# Patient Record
Sex: Female | Born: 1940
Health system: Southern US, Community
[De-identification: ages and names within clinical notes are randomized; demographics above are authoritative.]

## PROBLEM LIST (undated history)

## (undated) DIAGNOSIS — E785 Hyperlipidemia, unspecified: Secondary | ICD-10-CM

## (undated) DIAGNOSIS — I10 Essential (primary) hypertension: Secondary | ICD-10-CM

## (undated) DIAGNOSIS — Z9109 Other allergy status, other than to drugs and biological substances: Secondary | ICD-10-CM

## (undated) DIAGNOSIS — K219 Gastro-esophageal reflux disease without esophagitis: Secondary | ICD-10-CM

## (undated) HISTORY — PX: COLONOSCOPY: SHX174

## (undated) HISTORY — DX: Hyperlipidemia, unspecified: E78.5

## (undated) HISTORY — PX: TONSILLECTOMY: SHX5217

---

## 2001-12-20 DIAGNOSIS — J309 Allergic rhinitis, unspecified: Secondary | ICD-10-CM | POA: Insufficient documentation

## 2007-03-13 ENCOUNTER — Ambulatory Visit: Payer: Self-pay | Admitting: Gastroenterology

## 2007-03-13 LAB — HM COLONOSCOPY

## 2007-03-15 ENCOUNTER — Ambulatory Visit: Payer: Self-pay | Admitting: Family Medicine

## 2008-09-18 ENCOUNTER — Ambulatory Visit: Payer: Self-pay | Admitting: Family Medicine

## 2008-10-18 ENCOUNTER — Ambulatory Visit: Payer: Self-pay | Admitting: Family Medicine

## 2010-01-02 DIAGNOSIS — E785 Hyperlipidemia, unspecified: Secondary | ICD-10-CM | POA: Insufficient documentation

## 2011-04-02 LAB — HM PAP SMEAR

## 2011-04-13 ENCOUNTER — Ambulatory Visit: Payer: Self-pay | Admitting: Family Medicine

## 2012-06-15 ENCOUNTER — Ambulatory Visit: Payer: Self-pay | Admitting: Family Medicine

## 2012-06-15 LAB — HM MAMMOGRAPHY: HM Mammogram: 7112013

## 2012-12-13 ENCOUNTER — Ambulatory Visit: Payer: Self-pay | Admitting: Family Medicine

## 2013-01-23 ENCOUNTER — Ambulatory Visit: Payer: Self-pay | Admitting: Family Medicine

## 2014-09-12 LAB — LIPID PANEL
CHOLESTEROL: 220 mg/dL — AB (ref 0–200)
HDL: 52 mg/dL (ref 35–70)
LDL Cholesterol: 153 mg/dL
Triglycerides: 77 mg/dL (ref 40–160)

## 2014-10-30 ENCOUNTER — Ambulatory Visit: Payer: Self-pay | Admitting: Family Medicine

## 2014-10-30 LAB — HM DEXA SCAN

## 2014-12-20 DIAGNOSIS — M5489 Other dorsalgia: Secondary | ICD-10-CM | POA: Diagnosis not present

## 2014-12-20 DIAGNOSIS — M9903 Segmental and somatic dysfunction of lumbar region: Secondary | ICD-10-CM | POA: Diagnosis not present

## 2015-01-03 DIAGNOSIS — M5489 Other dorsalgia: Secondary | ICD-10-CM | POA: Diagnosis not present

## 2015-01-03 DIAGNOSIS — M9903 Segmental and somatic dysfunction of lumbar region: Secondary | ICD-10-CM | POA: Diagnosis not present

## 2015-03-02 ENCOUNTER — Emergency Department: Payer: Self-pay | Admitting: Emergency Medicine

## 2015-03-02 DIAGNOSIS — R42 Dizziness and giddiness: Secondary | ICD-10-CM | POA: Diagnosis not present

## 2015-03-02 DIAGNOSIS — R55 Syncope and collapse: Secondary | ICD-10-CM | POA: Diagnosis not present

## 2015-03-02 LAB — CBC WITH DIFFERENTIAL/PLATELET
BASOS PCT: 0.3 %
Basophil #: 0 10*3/uL (ref 0.0–0.1)
Eosinophil #: 0.2 10*3/uL (ref 0.0–0.7)
Eosinophil %: 1.8 %
HCT: 41.6 % (ref 35.0–47.0)
HGB: 13.6 g/dL (ref 12.0–16.0)
Lymphocyte #: 3.3 10*3/uL (ref 1.0–3.6)
Lymphocyte %: 35.2 %
MCH: 29.5 pg (ref 26.0–34.0)
MCHC: 32.6 g/dL (ref 32.0–36.0)
MCV: 91 fL (ref 80–100)
Monocyte #: 0.5 x10 3/mm (ref 0.2–0.9)
Monocyte %: 5.2 %
Neutrophil #: 5.4 10*3/uL (ref 1.4–6.5)
Neutrophil %: 57.5 %
Platelet: 216 10*3/uL (ref 150–440)
RBC: 4.59 10*6/uL (ref 3.80–5.20)
RDW: 13.9 % (ref 11.5–14.5)
WBC: 9.4 10*3/uL (ref 3.6–11.0)

## 2015-03-02 LAB — URINALYSIS, COMPLETE
BACTERIA: NONE SEEN
BLOOD: NEGATIVE
Bilirubin,UR: NEGATIVE
GLUCOSE, UR: NEGATIVE mg/dL (ref 0–75)
Leukocyte Esterase: NEGATIVE
NITRITE: NEGATIVE
PH: 7 (ref 4.5–8.0)
Protein: NEGATIVE
RBC,UR: 10 /HPF (ref 0–5)
SPECIFIC GRAVITY: 1.017 (ref 1.003–1.030)
Squamous Epithelial: <1

## 2015-03-02 LAB — BASIC METABOLIC PANEL
Anion Gap: 6 — ABNORMAL LOW (ref 7–16)
BUN: 17 mg/dL
CHLORIDE: 104 mmol/L
CO2: 29 mmol/L
CREATININE: 0.67 mg/dL
Calcium, Total: 9.3 mg/dL
EGFR (African American): >60
EGFR (Non-African Amer.): >60
GLUCOSE: 116 mg/dL — AB
Potassium: 4.3 mmol/L
Sodium: 139 mmol/L

## 2015-03-02 LAB — TROPONIN I: Troponin-I: <0.03 ng/mL

## 2015-03-07 DIAGNOSIS — I10 Essential (primary) hypertension: Secondary | ICD-10-CM | POA: Diagnosis not present

## 2015-03-07 LAB — CBC AND DIFFERENTIAL
HEMATOCRIT: 45 % (ref 36–46)
HEMOGLOBIN: 14.7 g/dL (ref 12.0–16.0)
Platelets: 276 10*3/uL (ref 150–399)
WBC: 9.4 10*3/mL

## 2015-03-07 LAB — BASIC METABOLIC PANEL
BUN: 12 mg/dL (ref 4–21)
CREATININE: 0.8 mg/dL (ref <=1.1)
GLUCOSE: 96 mg/dL
Potassium: 4.9 mmol/L (ref 3.4–5.3)
Sodium: 142 mmol/L (ref 137–147)

## 2015-03-07 LAB — HEPATIC FUNCTION PANEL
ALT: 12 U/L (ref 7–35)
AST: 19 U/L (ref 13–35)

## 2015-03-07 LAB — TSH: TSH: 3.37 u[IU]/mL (ref <=5.90)

## 2015-03-14 DIAGNOSIS — M9903 Segmental and somatic dysfunction of lumbar region: Secondary | ICD-10-CM | POA: Diagnosis not present

## 2015-03-14 DIAGNOSIS — M5489 Other dorsalgia: Secondary | ICD-10-CM | POA: Diagnosis not present

## 2015-03-19 DIAGNOSIS — M5489 Other dorsalgia: Secondary | ICD-10-CM | POA: Diagnosis not present

## 2015-03-19 DIAGNOSIS — M9903 Segmental and somatic dysfunction of lumbar region: Secondary | ICD-10-CM | POA: Diagnosis not present

## 2015-03-21 DIAGNOSIS — I10 Essential (primary) hypertension: Secondary | ICD-10-CM | POA: Diagnosis not present

## 2015-03-26 DIAGNOSIS — M9903 Segmental and somatic dysfunction of lumbar region: Secondary | ICD-10-CM | POA: Diagnosis not present

## 2015-03-26 DIAGNOSIS — M5489 Other dorsalgia: Secondary | ICD-10-CM | POA: Diagnosis not present

## 2015-03-31 DIAGNOSIS — I10 Essential (primary) hypertension: Secondary | ICD-10-CM | POA: Insufficient documentation

## 2015-04-02 DIAGNOSIS — M9903 Segmental and somatic dysfunction of lumbar region: Secondary | ICD-10-CM | POA: Diagnosis not present

## 2015-04-02 DIAGNOSIS — M5489 Other dorsalgia: Secondary | ICD-10-CM | POA: Diagnosis not present

## 2015-04-09 DIAGNOSIS — M5489 Other dorsalgia: Secondary | ICD-10-CM | POA: Diagnosis not present

## 2015-04-09 DIAGNOSIS — M9903 Segmental and somatic dysfunction of lumbar region: Secondary | ICD-10-CM | POA: Diagnosis not present

## 2015-04-16 DIAGNOSIS — M9903 Segmental and somatic dysfunction of lumbar region: Secondary | ICD-10-CM | POA: Diagnosis not present

## 2015-04-16 DIAGNOSIS — M5489 Other dorsalgia: Secondary | ICD-10-CM | POA: Diagnosis not present

## 2015-04-23 DIAGNOSIS — M9903 Segmental and somatic dysfunction of lumbar region: Secondary | ICD-10-CM | POA: Diagnosis not present

## 2015-04-23 DIAGNOSIS — M5489 Other dorsalgia: Secondary | ICD-10-CM | POA: Diagnosis not present

## 2015-04-30 DIAGNOSIS — M9903 Segmental and somatic dysfunction of lumbar region: Secondary | ICD-10-CM | POA: Diagnosis not present

## 2015-04-30 DIAGNOSIS — M5489 Other dorsalgia: Secondary | ICD-10-CM | POA: Diagnosis not present

## 2015-05-19 ENCOUNTER — Ambulatory Visit (INDEPENDENT_AMBULATORY_CARE_PROVIDER_SITE_OTHER): Payer: Medicare Other | Admitting: Family Medicine

## 2015-05-19 ENCOUNTER — Encounter: Payer: Self-pay | Admitting: Family Medicine

## 2015-05-19 VITALS — BP 134/72 | HR 60 | Temp 97.8°F | Resp 16 | Ht 60.25 in | Wt 205.0 lb

## 2015-05-19 DIAGNOSIS — Z1211 Encounter for screening for malignant neoplasm of colon: Secondary | ICD-10-CM

## 2015-05-19 DIAGNOSIS — Z0000 Encounter for general adult medical examination without abnormal findings: Secondary | ICD-10-CM | POA: Diagnosis not present

## 2015-05-19 DIAGNOSIS — Z23 Encounter for immunization: Secondary | ICD-10-CM | POA: Diagnosis not present

## 2015-05-19 LAB — IFOBT (OCCULT BLOOD): IFOBT: NEGATIVE

## 2015-05-19 NOTE — Progress Notes (Signed)
Patient ID: Carrie Wheeler, female   DOB: [birth year], 74 y.o.   MRN: [id number] Patient: Carrie Wheeler, Female    DOB: January 29, 1941, 74 y.o.   MRN: [id number] Visit Date: 05/19/2015  Today's Provider: Margarita Rana, MD   Chief Complaint  Patient presents with  . Annual Exam   Subjective:    Annual wellness visit Carrie Wheeler is a 74 y.o. female who presents today for her Subsequent Annual Wellness Visit. She feels well with no complaints. She reports exercising about two days a week. She reports she is sleeping well.    -----------------------------------------------------------   Review of Systems  Constitutional: Negative.   HENT: Negative.   Eyes: Negative.   Respiratory: Negative.   Cardiovascular: Negative.   Gastrointestinal: Negative.   Endocrine: Negative.   Genitourinary: Negative.   Musculoskeletal: Negative.   Skin: Negative.   Allergic/Immunologic: Negative.   Neurological: Negative.   Hematological: Negative.   Psychiatric/Behavioral: Negative.     History   Social History  . Marital Status: Single    Spouse Name: N/A  . Number of Children: N/A  . 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 Smoking status: Never Smoker   . Smokeless tobacco: Never Used  . Alcohol Use: No     Comment: Formerly a minimal user  . Drug Use: No  . Sexual Activity: Not on file   Other Topics Concern  . Not on file   Social History Narrative    Patient Active Problem List   Diagnosis Date Noted  . Essential (primary) hypertension 03/31/2015  . Extreme obesity 03/31/2015  . Hypercholesteremia 01/02/2010  . Avitaminosis D 12/11/2008  . Alcohol drinker 02/16/2007  . Family history of cancer of digestive system 10/31/2006  . Allergic rhinitis 12/20/2001    Past Surgical History  Procedure Laterality Date  . Tonsillectomy      Her family history includes Alzheimer's disease in her other; Colon  cancer in her father and other; Diabetes in her mother and other; Heart Problems in her brother; Hypertension in her father.    Previous Medications   AMLODIPINE (NORVASC) 5 MG TABLET    Take by mouth.   BIOFLAVONOID PRODUCTS (VITAMIN C) CHEW    Chew by mouth.   CALCIUM CARBONATE (OS-CAL) 600 MG TABS TABLET    Take 1 tablet by mouth daily.   CETIRIZINE (ZYRTEC) 10 MG TABLET    Take 1 tablet by mouth daily.   CHOLECALCIFEROL (VITAMIN D) 2000 UNITS CAPS    Take by mouth.   FLUTICASONE (FLONASE) 50 MCG/ACT NASAL SPRAY    Place into the nose.   MONTELUKAST (SINGULAIR) 10 MG TABLET    Take by mouth.   MULTIPLE VITAMINS-MINERALS (OCUVITE ADULT 50+ PO)    Take by mouth.   OMEGA-3 FATTY ACIDS PO    FISH-EPA, 1000MG  (Oral Capsule)  2 po bid for 0 days  Quantity: 120.00;  Refills: 0   Ordered :24-Dec-2010  Margarita Rana MD;  Started 08-Apr-2010 Active Comments: DX: 272.0   PSYLLIUM 48.57 % POWD    METAMUCIL, 48.57% (Oral Powder)  QHS, As Directed for 0 days  Quantity: 0.00;  Refills: 0   Ordered :24-Dec-2010  Doy Hutching ;  Started 10-Apr-2010 Active    Patient Care Team: Margarita Rana, MD as PCP - General (Family Medicine)     Objective:   Vitals: BP 134/72 mmHg  Pulse 60  Temp(Src) 97.8 F (36.6 C) (Oral)  Resp 16  Ht 5' 0.25" (1.53 m)  Wt 205 lb (92.987 kg)  BMI 39.72 kg/m2  Physical Exam  Constitutional: She appears well-developed and well-nourished.  HENT:  Head: Normocephalic and atraumatic.  Right Ear: Hearing, tympanic membrane, external ear and ear canal normal.  Left Ear: Hearing, tympanic membrane, external ear and ear canal normal.  Nose: Nose normal.  Mouth/Throat: Uvula is midline, oropharynx is clear and moist and mucous membranes are normal.  Eyes: EOM and lids are normal. Pupils are equal, round, and reactive to light.  Neck: Trachea normal and normal range of motion. Neck supple. Carotid bruit is not present.  Cardiovascular: Normal rate, regular rhythm and normal  heart sounds.   Pulmonary/Chest: Effort normal and breath sounds normal. Right breast exhibits no inverted nipple, no mass, no nipple discharge, no skin change and no tenderness. Left breast exhibits no inverted nipple, no mass, no nipple discharge, no skin change and no tenderness. Breasts are symmetrical.  Abdominal: Soft. Normal appearance and bowel sounds are normal.  Genitourinary: Guaiac negative stool. No breast swelling, tenderness, discharge or bleeding.  Musculoskeletal: Normal range of motion.  Neurological: She is alert. She has normal reflexes.  Skin: Skin is warm and dry.  Psychiatric: She has a normal mood and affect. Her behavior is normal. Judgment and thought content normal.    Activities of Daily Living In your present state of health, do you have any difficulty performing the following activities: 05/19/2015  Hearing? N  Vision? N  Difficulty concentrating or making decisions? N  Walking or climbing stairs? Y  Dressing or bathing? N  Doing errands, shopping? N    Fall Risk Assessment Fall Risk  05/19/2015  Falls in the past year? No     Depression Screen PHQ 2/9 Scores 05/19/2015  PHQ - 2 Score 0          Assessment & Plan:     Annual Wellness Visit  OC light negative today.  Continue current medication and plan of care. Increase activity.     Reviewed patient's Family Medical History Reviewed and updated list of patient's medical providers Assessed patient's functional ability Established a written schedule for health screening [place] Completed and Reviewed  Exercise Activities and Dietary recommendations Goals    None      Immunization History  Administered Date(s) Administered  . Pneumococcal Conjugate-13 05/19/2015  . Pneumococcal Polysaccharide-23 04/02/2011    Health Maintenance  Topic Date Due  . TETANUS/TDAP  12/04/1962  . ZOSTAVAX  12/05/2003  . PNA vac Low Risk Adult (2 of 2 - PCV13) 04/01/2012  .  MAMMOGRAM  06/15/2014  . INFLUENZA VACCINE  07/07/2015  . COLONOSCOPY  03/12/2017  . DEXA SCAN  Completed      Discussed health benefits of physical activity, and encouraged her to engage in regular exercise appropriate for her age and condition.    ------------------------------------------------------------------------------------------------------------  Patient was seen and examined by Jerrell Belfast, MD, and note scribed by Ashley Royalty, [place].  I have reviewed the document for accuracy and completeness and I agree with above. Jerrell Belfast, MD   Margarita Rana, MD

## 2015-09-01 ENCOUNTER — Ambulatory Visit
Admission: RE | Admit: 2015-09-01 | Discharge: 2015-09-01 | Disposition: A | Discharge status: Home / Self Care | Payer: Medicare Other | Source: Ambulatory Visit | Attending: Family Medicine | Admitting: Family Medicine

## 2015-09-01 DIAGNOSIS — Z1231 Encounter for screening mammogram for malignant neoplasm of breast: Secondary | ICD-10-CM | POA: Diagnosis present

## 2015-09-19 ENCOUNTER — Ambulatory Visit: Payer: Self-pay

## 2015-09-19 ENCOUNTER — Encounter: Payer: Self-pay | Admitting: Family Medicine

## 2015-09-19 ENCOUNTER — Ambulatory Visit (INDEPENDENT_AMBULATORY_CARE_PROVIDER_SITE_OTHER): Payer: Medicare Other | Admitting: Family Medicine

## 2015-09-19 VITALS — BP 140/64 | HR 60 | Temp 97.9°F | Resp 16 | Wt 209.0 lb

## 2015-09-19 DIAGNOSIS — J302 Other seasonal allergic rhinitis: Secondary | ICD-10-CM

## 2015-09-19 DIAGNOSIS — E785 Hyperlipidemia, unspecified: Secondary | ICD-10-CM

## 2015-09-19 DIAGNOSIS — I10 Essential (primary) hypertension: Secondary | ICD-10-CM

## 2015-09-19 MED ORDER — ASPIRIN 81 MG PO CHEW: 81.0000 mg | CHEWABLE_TABLET | Freq: Every day | ORAL | Status: DC

## 2015-09-19 NOTE — Progress Notes (Signed)
Subjective:    Patient ID: Carrie Wheeler, female    DOB: 1940/12/23, 74 y.o.   MRN: [id number]  Hypertension This is a chronic problem. Pertinent negatives include no anxiety, blurred vision, chest pain, headaches, malaise/fatigue, neck pain, orthopnea, palpitations, shortness of breath or sweats. Peripheral edema: occasional. Treatments tried: Amlodipine 5 mg. The current treatment provides moderate improvement. There are no compliance problems.   Hyperlipidemia This is a chronic problem. Lipid results: 09/12/2014- Total- 220, Trig- 77, HDL- 52, LDL- 153. Associated symptoms include leg pain ("sometimes"). Pertinent negatives include no chest pain, focal sensory loss, focal weakness, myalgias or shortness of breath. Treatments tried: Fish Oil once daily. There are no compliance problems.  Risk factors for coronary artery disease include dyslipidemia, hypertension, post-menopausal and a sedentary lifestyle.   Health nurse came to see patient. Concerned about sugar.  No history of this. Also asked about baby ASA. Patient will restart.  Allergies under good control.    Review of Systems  Constitutional: Negative for malaise/fatigue.  HENT: Positive for congestion.   Eyes: Negative for blurred vision.  Respiratory: Negative for shortness of breath.   Cardiovascular: Negative for chest pain, palpitations and orthopnea.  Musculoskeletal: Negative for myalgias and neck pain.  Neurological: Negative for focal weakness and headaches.   BP 140/64 mmHg  Pulse 60  Temp(Src) 97.9 F (36.6 C) (Oral)  Resp 16  Wt 209 lb (94.802 kg)   Patient Active Problem List   Diagnosis Date Noted  . Essential (primary) hypertension 03/31/2015  . Extreme obesity ([place]) 03/31/2015  . Hypercholesteremia 01/02/2010  . Avitaminosis D 12/11/2008  . Alcohol drinker ([place]) 02/16/2007  . Family history of cancer of digestive system 10/31/2006  . Allergic rhinitis 12/20/2001   No past medical history on  file. Current Outpatient Prescriptions on File Prior to Visit  Medication Sig  . amLODipine (NORVASC) 5 MG tablet Take by mouth.  [place] Kitchen Bioflavonoid Products (VITAMIN C) CHEW Chew by mouth.  . calcium carbonate (OS-CAL) 600 MG TABS tablet Take 1 tablet by mouth daily.  . cetirizine (ZYRTEC) 10 MG tablet Take 1 tablet by mouth daily.  . Cholecalciferol (VITAMIN D) 2000 UNITS CAPS Take by mouth.  . fluticasone (FLONASE) 50 MCG/ACT nasal spray Place into the nose.  . montelukast (SINGULAIR) 10 MG tablet Take by mouth.  . Multiple Vitamins-Minerals (OCUVITE ADULT 50+ PO) Take by mouth.  . OMEGA-3 FATTY ACIDS PO FISH-EPA, 1000MG  (Oral Capsule)  2 po bid for 0 days  Quantity: 120.00;  Refills: 0   Ordered :24-Dec-2010  Margarita Rana MD;  Started 08-Apr-2010 Active Comments: DX: 272.0  . Psyllium 48.57 % POWD METAMUCIL, 48.57% (Oral Powder)  QHS, As Directed for 0 days  Quantity: 0.00;  Refills: 0   Ordered :24-Dec-2010  Doy Hutching ;  Started 10-Apr-2010 Active   No current facility-administered medications on file prior to visit.   No Known Allergies Past Surgical History  Procedure Laterality Date  . Tonsillectomy     Social History   Social History  . Marital Status: Single    Spouse Name: N/A  . Number of Children: N/A  . 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 Smoking status: Never Smoker   . Smokeless tobacco: Never Used  . Alcohol Use: No     Comment: Formerly a minimal user  . Drug Use: No  . Sexual Activity: Not on file  Other Topics Concern  . Not on file   Social History Narrative   Family History  Problem Relation Age of Onset  . Hypertension Father   . Colon cancer Father   . Alzheimer's disease Other   . Diabetes Other   . Colon cancer Other   . Diabetes Mother   . Heart Problems Brother     Congential heart defect      Objective:   Physical Exam  Constitutional: She is oriented to  person, place, and time. She appears well-developed and well-nourished.  Cardiovascular: Normal rate and regular rhythm.   Pulmonary/Chest: Effort normal and breath sounds normal.  Neurological: She is alert and oriented to person, place, and time.  Psychiatric: She has a normal mood and affect. Her behavior is normal. Judgment and thought content normal.    BP 140/64 mmHg  Pulse 60  Temp(Src) 97.9 F (36.6 C) (Oral)  Resp 16  Wt 209 lb (94.802 kg)       Assessment & Plan:  1. Hyperlipidemia Will restart baby ASA. Check labs. Further plan pending these results.  - Lipid panel - CBC with Differential/Platelet - Comprehensive metabolic panel - aspirin (ASPIRIN CHILDRENS) 81 MG chewable tablet; Chew 1 tablet (81 mg total) by mouth daily.  Dispense: 1 tablet; Refill: 0  2. Essential (primary) hypertension Stable. Continue current medication.  Monitor as at upper limits today.   3. Other seasonal allergic rhinitis Continue all of her allergy medication for now.   Has been bad allergy season.   Margarita Rana, MD d

## 2015-09-20 LAB — CBC WITH DIFFERENTIAL/PLATELET
BASOS: 0 %
Basophils Absolute: 0 10*3/uL (ref 0.0–0.2)
EOS (ABSOLUTE): 0.3 10*3/uL (ref 0.0–0.4)
EOS: 4 %
HEMATOCRIT: 40.6 % (ref 34.0–46.6)
Hemoglobin: 13.7 g/dL (ref 11.1–15.9)
IMMATURE GRANS (ABS): 0 10*3/uL (ref 0.0–0.1)
IMMATURE GRANULOCYTES: 0 %
LYMPHS: 40 %
Lymphocytes Absolute: 3.4 10*3/uL — ABNORMAL HIGH (ref 0.7–3.1)
MCH: 30.4 pg (ref 26.6–33.0)
MCHC: 33.7 g/dL (ref 31.5–35.7)
MCV: 90 fL (ref 79–97)
MONOCYTES: 5 %
MONOS ABS: 0.5 10*3/uL (ref 0.1–0.9)
Neutrophils Absolute: 4.3 10*3/uL (ref 1.4–7.0)
Neutrophils: 51 %
Platelets: 271 10*3/uL (ref 150–379)
RBC: 4.51 x10E6/uL (ref 3.77–5.28)
RDW: 13.8 % (ref 12.3–15.4)
WBC: 8.6 10*3/uL (ref 3.4–10.8)

## 2015-09-20 LAB — COMPREHENSIVE METABOLIC PANEL
ALT: 14 IU/L (ref 0–32)
AST: 16 IU/L (ref 0–40)
Albumin/Globulin Ratio: 1.6 (ref 1.1–2.5)
Albumin: 4.1 g/dL (ref 3.5–4.8)
Alkaline Phosphatase: 100 IU/L (ref 39–117)
BUN/Creatinine Ratio: 24 (ref 11–26)
BUN: 21 mg/dL (ref 8–27)
Bilirubin Total: 0.5 mg/dL (ref 0.0–1.2)
CALCIUM: 9.6 mg/dL (ref 8.7–10.3)
CO2: 26 mmol/L (ref 18–29)
CREATININE: 0.86 mg/dL (ref 0.57–1.00)
Chloride: 100 mmol/L (ref 97–108)
GFR calc Af Amer: 79 mL/min/{1.73_m2} (ref >59)
GFR, EST NON AFRICAN AMERICAN: 68 mL/min/{1.73_m2} (ref >59)
GLOBULIN, TOTAL: 2.5 g/dL (ref 1.5–4.5)
Glucose: 89 mg/dL (ref 65–99)
POTASSIUM: 4.7 mmol/L (ref 3.5–5.2)
SODIUM: 141 mmol/L (ref 134–144)
TOTAL PROTEIN: 6.6 g/dL (ref 6.0–8.5)

## 2015-09-20 LAB — LIPID PANEL
Chol/HDL Ratio: 4.1 ratio units (ref 0.0–4.4)
Cholesterol, Total: 244 mg/dL — ABNORMAL HIGH (ref 100–199)
HDL: 60 mg/dL (ref >39)
LDL Calculated: 168 mg/dL — ABNORMAL HIGH (ref 0–99)
Triglycerides: 81 mg/dL (ref 0–149)
VLDL CHOLESTEROL CAL: 16 mg/dL (ref 5–40)

## 2015-09-22 ENCOUNTER — Telehealth: Payer: Self-pay

## 2015-09-22 NOTE — Telephone Encounter (Signed)
Pt is returning call.  CB#[phone]/MW °

## 2015-09-22 NOTE — Telephone Encounter (Signed)
-----   Message from Margarita Rana, MD sent at 09/20/2015 11:41 AM EDT ----- Labs stable except for elevated cholesterol at 244. 10 year risk of heart disease is 16 percent and medication is recommended.  Please see if patient would like to start statin. Thanks.

## 2015-09-22 NOTE — Telephone Encounter (Signed)
LMTCB 09/22/2015  Thanks,   -Mickel Baas

## 2015-09-23 NOTE — Telephone Encounter (Signed)
Pt advised; she wants to work on lifestyle changes first.  I advised her to call around January/February to get a lab slip.  Thanks,   -Mickel Baas

## 2015-10-06 ENCOUNTER — Other Ambulatory Visit: Payer: Self-pay | Admitting: Family Medicine

## 2015-10-06 DIAGNOSIS — I10 Essential (primary) hypertension: Secondary | ICD-10-CM

## 2015-11-17 ENCOUNTER — Encounter: Payer: Self-pay | Admitting: Emergency Medicine

## 2015-11-17 ENCOUNTER — Ambulatory Visit: Payer: Medicare Other | Admitting: Family Medicine

## 2015-11-17 ENCOUNTER — Emergency Department
Admission: EM | Admit: 2015-11-17 | Discharge: 2015-11-17 | Disposition: A | Discharge status: Home / Self Care | Payer: Medicare Other | Attending: Emergency Medicine | Admitting: Emergency Medicine

## 2015-11-17 DIAGNOSIS — R112 Nausea with vomiting, unspecified: Secondary | ICD-10-CM | POA: Diagnosis present

## 2015-11-17 DIAGNOSIS — K649 Unspecified hemorrhoids: Secondary | ICD-10-CM | POA: Insufficient documentation

## 2015-11-17 DIAGNOSIS — Z7982 Long term (current) use of aspirin: Secondary | ICD-10-CM | POA: Insufficient documentation

## 2015-11-17 DIAGNOSIS — K625 Hemorrhage of anus and rectum: Secondary | ICD-10-CM | POA: Insufficient documentation

## 2015-11-17 DIAGNOSIS — I10 Essential (primary) hypertension: Secondary | ICD-10-CM | POA: Insufficient documentation

## 2015-11-17 DIAGNOSIS — Z79899 Other long term (current) drug therapy: Secondary | ICD-10-CM | POA: Insufficient documentation

## 2015-11-17 HISTORY — DX: Essential (primary) hypertension: I10

## 2015-11-17 LAB — COMPREHENSIVE METABOLIC PANEL
ALT: 19 U/L (ref 14–54)
AST: 20 U/L (ref 15–41)
Albumin: 3.7 g/dL (ref 3.5–5.0)
Alkaline Phosphatase: 92 U/L (ref 38–126)
Anion gap: 6 (ref 5–15)
BUN: 16 mg/dL (ref 6–20)
CHLORIDE: 105 mmol/L (ref 101–111)
CO2: 29 mmol/L (ref 22–32)
CREATININE: 0.73 mg/dL (ref 0.44–1.00)
Calcium: 9.1 mg/dL (ref 8.9–10.3)
GFR calc Af Amer: >60 mL/min (ref >60)
GFR calc non Af Amer: >60 mL/min (ref >60)
Glucose, Bld: 94 mg/dL (ref 65–99)
POTASSIUM: 3.9 mmol/L (ref 3.5–5.1)
SODIUM: 140 mmol/L (ref 135–145)
Total Bilirubin: 0.8 mg/dL (ref 0.3–1.2)
Total Protein: 6.9 g/dL (ref 6.5–8.1)

## 2015-11-17 LAB — URINALYSIS COMPLETE WITH MICROSCOPIC (ARMC ONLY)
BACTERIA UA: NONE SEEN
Bilirubin Urine: NEGATIVE
GLUCOSE, UA: NEGATIVE mg/dL
Ketones, ur: NEGATIVE mg/dL
LEUKOCYTES UA: NEGATIVE
Nitrite: NEGATIVE
Protein, ur: NEGATIVE mg/dL
Specific Gravity, Urine: 1.015 (ref 1.005–1.030)
pH: 6 (ref 5.0–8.0)

## 2015-11-17 LAB — CBC WITH DIFFERENTIAL/PLATELET
Basophils Absolute: 0 10*3/uL (ref 0–0.1)
Basophils Relative: 0 %
EOS ABS: 0.2 10*3/uL (ref 0–0.7)
Eosinophils Relative: 2 %
HCT: 39 % (ref 35.0–47.0)
Hemoglobin: 13.1 g/dL (ref 12.0–16.0)
LYMPHS ABS: 3.3 10*3/uL (ref 1.0–3.6)
LYMPHS PCT: 30 %
MCH: 30.3 pg (ref 26.0–34.0)
MCHC: 33.6 g/dL (ref 32.0–36.0)
MCV: 90.1 fL (ref 80.0–100.0)
MONOS PCT: 5 %
Monocytes Absolute: 0.6 10*3/uL (ref 0.2–0.9)
NEUTROS ABS: 6.9 10*3/uL — AB (ref 1.4–6.5)
NEUTROS PCT: 63 %
PLATELETS: 223 10*3/uL (ref 150–440)
RBC: 4.33 MIL/uL (ref 3.80–5.20)
RDW: 14 % (ref 11.5–14.5)
WBC: 11 10*3/uL (ref 3.6–11.0)

## 2015-11-17 LAB — LIPASE, BLOOD: Lipase: 27 U/L (ref 11–51)

## 2015-11-17 NOTE — Discharge Instructions (Signed)
It is unclear where the bleeding may be coming from, but it is certainly possible that it is from hemorrhoids. With your abdomen nontender currently, we discussed the pros and cons of a CT scan and agreed not to pursue that now. If you have further abdominal pain or heavier bleeding or other urgent concerns, return to the emergency department immediately. Follow-up with Dr. Venia Minks and call Dr. Dorothey Baseman office as well for follow-up and a colonoscopy soon.  Gastrointestinal Bleeding Gastrointestinal bleeding is bleeding somewhere along the path that food travels through the body (digestive tract). This path is anywhere between the mouth and the opening of the butt (anus). You may have blood in your throw up (vomit) or in your poop (stools). If there is a lot of bleeding, you may need to stay in the hospital. [place]  Only take medicine as told by your doctor.  Eat foods with fiber such as whole grains, fruits, and vegetables. You can also try eating 1 to 3 prunes a day.  Drink enough fluids to keep your pee (urine) clear or pale yellow. GET HELP RIGHT AWAY IF:   Your bleeding gets worse.  You feel dizzy, weak, or you pass out (faint).  You have bad cramps in your back or belly (abdomen).  You have large blood clumps (clots) in your poop.  Your problems are getting worse. MAKE SURE YOU:   Understand these instructions.  Will watch your condition.  Will get help right away if you are not doing well or get worse.   This information is not intended to replace advice given to you by your health care provider. Make sure you discuss any questions you have with your health care provider.   Document Released: 08/31/2008 Document Revised: 11/08/2012 Document Reviewed: 05/12/2015 Elsevier Interactive Patient Education [employer].

## 2015-11-17 NOTE — ED Notes (Signed)
Pt reports that she has had some blood in the toilet yesterday and today. STates she had n/v on Saturday with abdominal pain, but she is not experiencing that today.

## 2015-11-17 NOTE — ED Provider Notes (Signed)
[facility] Emergency Department Provider Note  ____________________________________________  Time seen:  1410  I have reviewed the triage vital signs and the nursing notes.  History by:    HISTORY  Chief Complaint Emesis     HPI KACIE DIERS is a 74 y.o. female who reports she had some nausea and diarrhea on Saturday. She had some abdominal pain which is the middle lower abdomen. On Sunday she had some additional diarrhea and noticed a little bit of blood in the toilet. She reports that the toilet just became light red without much blood. Today she feels well, with no nausea and no abdominal pain, but she noticed a small amount of blood again today. The blood is bright red. She denies any melena. She does have a history of hemorrhoids, but she is concerned that it may not be a hemorrhoid. She reports that she had increased blood with she had some flatulence.   Past Medical History  Diagnosis Date  . Hypertension     Patient Active Problem List   Diagnosis Date Noted  . Essential (primary) hypertension 03/31/2015  . Extreme obesity ([place]) 03/31/2015  . Hyperlipidemia 01/02/2010  . Avitaminosis D 12/11/2008  . Family history of cancer of digestive system 10/31/2006  . Allergic rhinitis 12/20/2001    Past Surgical History  Procedure Laterality Date  . Tonsillectomy      Current Outpatient Rx  Name  Route  Sig  Dispense  Refill  . amLODipine (NORVASC) 5 MG tablet      TAKE 1 TABLET BY MOUTH DAILY   90 tablet   1   . aspirin (ASPIRIN CHILDRENS) 81 MG chewable tablet   Oral   Chew 1 tablet (81 mg total) by mouth daily.   1 tablet   0   . Bioflavonoid Products (VITAMIN C) CHEW   Oral   Chew by mouth.         . calcium carbonate (OS-CAL) 600 MG TABS tablet   Oral   Take 1 tablet by mouth daily.         . cetirizine (ZYRTEC) 10 MG tablet   Oral   Take 1 tablet by mouth daily.         . Cholecalciferol (VITAMIN D) 2000 UNITS  CAPS   Oral   Take by mouth.         . fluticasone (FLONASE) 50 MCG/ACT nasal spray   Nasal   Place into the nose.         . montelukast (SINGULAIR) 10 MG tablet   Oral   Take by mouth.         . Multiple Vitamins-Minerals (OCUVITE ADULT 50+ PO)   Oral   Take by mouth.         . OMEGA-3 FATTY ACIDS PO      FISH-EPA, 1000MG  (Oral Capsule)  2 po bid for 0 days  Quantity: 120.00;  Refills: 0   Ordered :24-Dec-2010  Margarita Rana MD;  Started 08-Apr-2010 Active Comments: DX: 272.0         . Psyllium 48.57 % POWD      METAMUCIL, 48.57% (Oral Powder)  QHS, As Directed for 0 days  Quantity: 0.00;  Refills: 0   Ordered :24-Dec-2010  Doy Hutching ;  Started 10-Apr-2010 Active           Allergies Review of patient's allergies indicates no known allergies.  Family History  Problem Relation Age of Onset  . Hypertension Father   .  Colon cancer Father   . Alzheimer's disease Other   . Diabetes Other   . Colon cancer Other   . Diabetes Mother   . Heart Problems Brother     Congential heart defect    Social History Social History  Substance Use Topics  . Smoking status: Never Smoker   . Smokeless tobacco: Never Used  . 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 ENT: Negative for congestion. Cardiovascular: Negative for chest pain. Respiratory: Negative for cough. Gastrointestinal: Positive for some lower central abdominal pain 2 days ago, none now. Some blood per rectum, bright red, see history of present illness Genitourinary: Negative for dysuria. Musculoskeletal: No back pain. Skin: Negative for rash. Neurological: Negative for headache or focal weakness   10-point ROS otherwise negative.  ____________________________________________   PHYSICAL EXAM:  VITAL SIGNS: ED Triage Vitals  Enc Vitals Group     BP 11/17/15 1008 145/86 mmHg      Pulse Rate 11/17/15 1008 74     Resp 11/17/15 1008 20     Temp 11/17/15 1008 98.7 F (37.1 C)     Temp Source 11/17/15 1008 Oral     SpO2 11/17/15 1008 96 %     Weight 11/17/15 1008 205 lb (92.987 kg)     Height 11/17/15 1008 5\' 1"  (1.549 m)     Head Cir --      Peak Flow --      Pain Score 11/17/15 1008 2     Pain Loc --      Pain Edu? --      Excl. in [place]? --     Constitutional: Alert and oriented. Well appearing and in no distress. ENT   Head: Normocephalic and atraumatic.   Nose: No congestion/rhinnorhea.       Mouth: No erythema, no swelling   Cardiovascular: Normal rate, regular rhythm, no murmur noted Respiratory:  Normal respiratory effort, no tachypnea.    Breath sounds are clear and equal bilaterally.  Gastrointestinal: Soft, no distention. Nontender Rectal: Some irregular tissue, likely hemorrhoid, with bright red blood on gloved finger. Confirmed heme positive on Hemoccult. Back: No muscle spasm, no tenderness, no CVA tenderness. Musculoskeletal: No deformity noted. Nontender with normal range of motion in all extremities.  No noted edema. Neurologic:  Communicative. Normal appearing spontaneous movement in all 4 extremities. No gross focal neurologic deficits are appreciated.  Skin:  Skin is warm, dry. No rash noted. Psychiatric: Mood and affect are normal. Speech and behavior are normal.  ____________________________________________    LABS (pertinent positives/negatives)  Labs Reviewed  CBC WITH DIFFERENTIAL/PLATELET - Abnormal; Notable for the following:    Neutro Abs 6.9 (*)    All other components within normal limits  URINALYSIS COMPLETEWITH MICROSCOPIC ([facility] ONLY) - Abnormal; Notable for the following:    Color, Urine YELLOW (*)    APPearance CLEAR (*)    Hgb urine dipstick 2+ (*)    Squamous Epithelial / LPF 0-5 (*)    All other components within normal limits  COMPREHENSIVE METABOLIC PANEL  LIPASE, BLOOD      ____________________________________________   INITIAL IMPRESSION / ASSESSMENT AND PLAN / ED COURSE  Pertinent labs & imaging results that were available during my care of the patient were reviewed by me and considered in my medical decision making (see chart for details).  Overall well-appearing 74 year old female in  no acute distress with benign abdomen and a positive rectal exam for blood. Patient's hemoglobin level is 13.1. The patient and I have discussed the range of possible diagnoses, but given her well appearance and benign abdomen, I have low suspicion for diverticulitis with diverticular bleed. I'm more suspicious for hemorrhoidal bleed given the irregular tissue present at the anal area as well as her history of prior hemorrhoids. Given her overall well appearance, excellent blood count, and normal heart rate, I believe she is safe for outpatient follow-up. I have discussed the patient getting a colonoscopy in the next week or 2 to evaluate this further. I have counseled her on the list to return the emergency Department, including increase blood output, any weakness, or other urgent concerns.  Her primary physician is Dr. Venia Minks. I will refer her to Dr. Venia Minks as well as gastroenterologist on-call, Dr. Allen Norris.  ____________________________________________   FINAL CLINICAL IMPRESSION(S) / ED DIAGNOSES  Final diagnoses:  Blood per rectum  Hemorrhoids, unspecified hemorrhoid type      Ahmed Prima, MD 11/17/15 1506

## 2015-11-17 NOTE — ED Notes (Signed)
Reports n/v/d x 2 days.  Reports noting bright red blood in last bm.  Skin w/d, NAD

## 2015-11-18 ENCOUNTER — Telehealth: Payer: Self-pay

## 2015-11-18 ENCOUNTER — Ambulatory Visit: Payer: Medicare Other | Admitting: Family Medicine

## 2015-11-18 DIAGNOSIS — R195 Other fecal abnormalities: Secondary | ICD-10-CM | POA: Insufficient documentation

## 2015-11-18 NOTE — Telephone Encounter (Signed)
Ok to order. Thanks.   

## 2015-11-18 NOTE — Telephone Encounter (Signed)
Pt went to ED on 11/17/2015 for bright red bloody stool. Dr. Thomasene Lot urged pt to proceed with a colonoscopy for further evaluation. Pt needs referral for colonoscopy per insurance. Pt was found to have a rectal exam positive for blood. Okay to refer? Renaldo Fiddler, CMA

## 2015-11-19 ENCOUNTER — Telehealth: Payer: Self-pay | Admitting: Gastroenterology

## 2015-11-19 ENCOUNTER — Other Ambulatory Visit: Payer: Self-pay

## 2015-11-19 NOTE — Telephone Encounter (Signed)
Gastroenterology Pre-Procedure Review  Request Date: 11-27-2015 Requesting Physician: Dr.   PATIENT REVIEW QUESTIONS: The patient responded to the following health history questions as indicated:    1. Are you having any GI issues? yes (Blood in stool) 2. Do you have a personal history of Polyps? no 3. Do you have a family history of Colon Cancer or Polyps? yes (Father Colon cancer) 4. Diabetes Mellitus? no 5. Joint replacements in the past 12 months?no 6. Major health problems in the past 3 months?no 7. Any artificial heart valves, MVP, or defibrillator?no    MEDICATIONS & ALLERGIES:    Patient reports the following regarding taking any anticoagulation/antiplatelet therapy:   Plavix, Coumadin, Eliquis, Xarelto, Lovenox, Pradaxa, Brilinta, or Effient? no Aspirin? no  Patient confirms/reports the following medications:  Current Outpatient Prescriptions  Medication Sig Dispense Refill   amLODipine (NORVASC) 5 MG tablet TAKE 1 TABLET BY MOUTH DAILY 90 tablet 1   aspirin (ASPIRIN CHILDRENS) 81 MG chewable tablet Chew 1 tablet (81 mg total) by mouth daily. 1 tablet 0   Bioflavonoid Products (VITAMIN C) CHEW Chew by mouth.     calcium carbonate (OS-CAL) 600 MG TABS tablet Take 1 tablet by mouth daily.     cetirizine (ZYRTEC) 10 MG tablet Take 1 tablet by mouth daily.     Cholecalciferol (VITAMIN D) 2000 UNITS CAPS Take by mouth.     fluticasone (FLONASE) 50 MCG/ACT nasal spray Place into the nose.     montelukast (SINGULAIR) 10 MG tablet Take by mouth.     Multiple Vitamins-Minerals (OCUVITE ADULT 50+ PO) Take by mouth.     OMEGA-3 FATTY ACIDS PO FISH-EPA, 1000MG  (Oral Capsule)  2 po bid for 0 days  Quantity: 120.00;  Refills: 0   Ordered :24-Dec-2010  Margarita Rana MD;  Started 08-Apr-2010 Active Comments: DX: 272.0     Psyllium 48.57 % POWD METAMUCIL, 48.57% (Oral Powder)  QHS, As Directed for 0 days  Quantity: 0.00;  Refills: 0   Ordered :24-Dec-2010  Doy Hutching ;   Started 10-Apr-2010 Active     No current facility-administered medications for this visit.    Patient confirms/reports the following allergies:  No Known Allergies  No orders of the defined types were placed in this encounter.    AUTHORIZATION INFORMATION Primary Insurance: 1D#: Group #:  Secondary Insurance: 1D#: Group #:  SCHEDULE INFORMATION: Date: 11-27-2015  Time: Location:

## 2015-11-21 ENCOUNTER — Telehealth: Payer: Self-pay | Admitting: Gastroenterology

## 2015-11-21 NOTE — Telephone Encounter (Signed)
No authorization is required for CPT: [phone] with [facility]. Decision ID #: [id number]

## 2015-11-24 ENCOUNTER — Encounter: Payer: Self-pay | Admitting: *Deleted

## 2015-11-25 NOTE — Discharge Instructions (Signed)

## 2015-11-27 ENCOUNTER — Ambulatory Visit: Payer: Medicare Other | Admitting: Anesthesiology

## 2015-11-27 ENCOUNTER — Encounter
Admission: RE | Disposition: A | Discharge status: Home / Self Care | Payer: Self-pay | Source: Ambulatory Visit | Attending: Gastroenterology

## 2015-11-27 ENCOUNTER — Other Ambulatory Visit: Payer: Self-pay | Admitting: Gastroenterology

## 2015-11-27 ENCOUNTER — Ambulatory Visit
Admission: RE | Admit: 2015-11-27 | Discharge: 2015-11-27 | Disposition: A | Discharge status: Home / Self Care | Payer: Medicare Other | Source: Ambulatory Visit | Attending: Gastroenterology | Admitting: Gastroenterology

## 2015-11-27 DIAGNOSIS — Z820 Family history of epilepsy and other diseases of the nervous system: Secondary | ICD-10-CM | POA: Diagnosis not present

## 2015-11-27 DIAGNOSIS — K641 Second degree hemorrhoids: Secondary | ICD-10-CM | POA: Diagnosis not present

## 2015-11-27 DIAGNOSIS — D123 Benign neoplasm of transverse colon: Secondary | ICD-10-CM | POA: Insufficient documentation

## 2015-11-27 DIAGNOSIS — K633 Ulcer of intestine: Secondary | ICD-10-CM | POA: Insufficient documentation

## 2015-11-27 DIAGNOSIS — D122 Benign neoplasm of ascending colon: Secondary | ICD-10-CM | POA: Insufficient documentation

## 2015-11-27 DIAGNOSIS — Z800 Family history of malignant neoplasm of digestive organs: Secondary | ICD-10-CM | POA: Insufficient documentation

## 2015-11-27 DIAGNOSIS — I10 Essential (primary) hypertension: Secondary | ICD-10-CM | POA: Diagnosis not present

## 2015-11-27 DIAGNOSIS — Z8249 Family history of ischemic heart disease and other diseases of the circulatory system: Secondary | ICD-10-CM | POA: Diagnosis not present

## 2015-11-27 DIAGNOSIS — Z79899 Other long term (current) drug therapy: Secondary | ICD-10-CM | POA: Insufficient documentation

## 2015-11-27 DIAGNOSIS — K219 Gastro-esophageal reflux disease without esophagitis: Secondary | ICD-10-CM | POA: Diagnosis not present

## 2015-11-27 DIAGNOSIS — K921 Melena: Secondary | ICD-10-CM | POA: Insufficient documentation

## 2015-11-27 DIAGNOSIS — Z833 Family history of diabetes mellitus: Secondary | ICD-10-CM | POA: Insufficient documentation

## 2015-11-27 DIAGNOSIS — Z7982 Long term (current) use of aspirin: Secondary | ICD-10-CM | POA: Diagnosis not present

## 2015-11-27 DIAGNOSIS — K529 Noninfective gastroenteritis and colitis, unspecified: Secondary | ICD-10-CM | POA: Diagnosis not present

## 2015-11-27 HISTORY — DX: Gastro-esophageal reflux disease without esophagitis: K21.9

## 2015-11-27 HISTORY — DX: Other allergy status, other than to drugs and biological substances: Z91.09

## 2015-11-27 HISTORY — PX: COLONOSCOPY WITH PROPOFOL: SHX5780

## 2015-11-27 SURGERY — COLONOSCOPY WITH PROPOFOL
Anesthesia: Monitor Anesthesia Care

## 2015-11-27 MED ORDER — LIDOCAINE HCL (CARDIAC) 20 MG/ML IV SOLN
INTRAVENOUS | Status: DC | PRN
Start: 2015-11-27 — End: 2015-11-27
  Administered 2015-11-27: 50 mg via INTRAVENOUS

## 2015-11-27 MED ORDER — LACTATED RINGERS IV SOLN
INTRAVENOUS | Status: DC
  Administered 2015-11-27: 07:00:00 via INTRAVENOUS

## 2015-11-27 MED ORDER — SODIUM CHLORIDE 0.9 % IV SOLN: INTRAVENOUS | Status: DC

## 2015-11-27 MED ORDER — PROPOFOL 10 MG/ML IV BOLUS
INTRAVENOUS | Status: DC | PRN
  Administered 2015-11-27: 50 mg via INTRAVENOUS
  Administered 2015-11-27: 100 mg via INTRAVENOUS
  Administered 2015-11-27 (×2): 30 mg via INTRAVENOUS
  Administered 2015-11-27: 50 mg via INTRAVENOUS
  Administered 2015-11-27: 20 mg via INTRAVENOUS

## 2015-11-27 MED ORDER — STERILE WATER FOR IRRIGATION IR SOLN
Status: DC | PRN
  Administered 2015-11-27: 08:00:00

## 2015-11-27 SURGICAL SUPPLY — 28 items

## 2015-11-27 NOTE — Transfer of Care (Signed)
Immediate Anesthesia Transfer of Care Note  Patient: Carrie Wheeler  Procedure(s) Performed: Procedure(s): COLONOSCOPY WITH PROPOFOL (N/A)  Patient Location: PACU  Anesthesia Type: MAC  Level of Consciousness: awake, alert  and patient cooperative  Airway and Oxygen Therapy: Patient Spontanous Breathing and Patient connected to supplemental oxygen  Post-op Assessment: Post-op Vital signs reviewed, Patient's Cardiovascular Status Stable, Respiratory Function Stable, Patent Airway and No signs of Nausea or vomiting  Post-op Vital Signs: Reviewed and stable  Complications: No apparent anesthesia complications

## 2015-11-27 NOTE — Op Note (Signed)
[facility] Patient Name: Carrie Wheeler Procedure Date: 11/27/2015 7:56 AM MRN: [id number] Account #: [id number] Date of Birth: 08/10/1941 Admit Type: Outpatient Age: 74 Room: [facility] OR ROOM 01 Gender: Female Note Status: Finalized Procedure:         Colonoscopy Indications:       Hematochezia Providers:         Lucilla Lame, MD Referring MD:      Jerrell Belfast, MD (Referring MD) Medicines:         Propofol per Anesthesia Complications:     No immediate complications. Procedure:         Pre-Anesthesia Assessment:                    - Prior to the procedure, a History and Physical was                     performed, and patient medications and allergies were                     reviewed. The patient's tolerance of previous anesthesia                     was also reviewed. The risks and benefits of the procedure                     and the sedation options and risks were discussed with the                     patient. All questions were answered, and informed consent                     was obtained. Prior Anticoagulants: The patient has taken                     no previous anticoagulant or antiplatelet agents. ASA                     Grade Assessment: II - A patient with mild systemic                     disease. After reviewing the risks and benefits, the                     patient was deemed in satisfactory condition to undergo                     the procedure.                    After obtaining informed consent, the colonoscope was                     passed under direct vision. Throughout the procedure, the                     patient's blood pressure, pulse, and oxygen saturations                     were monitored continuously. The Olympus CF H180AL                     colonoscope (S#: [id number]) was introduced through the anus  and advanced to the the cecum, identified by appendiceal                     orifice and  ileocecal valve. The colonoscopy was performed                     without difficulty. The patient tolerated the procedure                     well. The quality of the bowel preparation was good. Findings:      The perianal and digital rectal examinations were normal.      Three sessile polyps were found in the ascending colon. The polyps were       3 to 5 mm in size. These polyps were removed with a cold biopsy forceps.       Resection and retrieval were complete.      A 6 mm polyp was found in the transverse colon. The polyp was sessile.       The polyp was removed with a cold biopsy forceps. Resection and       retrieval were complete.      Non-bleeding internal hemorrhoids were found during retroflexion. The       hemorrhoids were Grade II (internal hemorrhoids that prolapse but reduce       spontaneously).      Discontinuous areas of nonbleeding ulcerated mucosa with no stigmata of       recent bleeding were present in the descending colon. Biopsies were       taken with a cold forceps for histology. Impression:        - Three 3 to 5 mm polyps in the ascending colon. Resected                     and retrieved.                    - One 6 mm polyp in the transverse colon. Resected and                     retrieved.                    - Non-bleeding internal hemorrhoids.                    - Mucosal ulceration. Biopsied. Recommendation:    - Await pathology results.                    - Repeat colonoscopy in 5 years if polyp adenoma and 10                     years if hyperplastic Procedure Code(s): --- Professional ---                    [phone], Colonoscopy, flexible; with biopsy, single or                     multiple Diagnosis Code(s): --- Professional ---                    K92.1, Melena                    D12.2, Benign neoplasm of ascending colon  D12.3, Benign neoplasm of transverse colon CPT copyright 2014 American Medical Association. All rights  reserved. The codes documented in this report are preliminary and upon coder review may  be revised to meet current compliance requirements. Lucilla Lame, MD 11/27/2015 8:14:50 AM This report has been signed electronically. Number of Addenda: 0 Note Initiated On: 11/27/2015 7:56 AM Scope Withdrawal Time: 0 hours 8 minutes 37 seconds  Total Procedure Duration: 0 hours 11 minutes 3 seconds       [facility]

## 2015-11-27 NOTE — H&P (Signed)
[facility]  [address], [place], [place] [address] Phone: [phone] Fax : [phone]  Primary Care Physician:  Margarita Rana, MD Primary Gastroenterologist:  Dr. Allen Norris  Pre-Procedure History & Physical: HPI:  Carrie Wheeler is a 74 y.o. female is here for an colonoscopy.   Past Medical History  Diagnosis Date  . Hypertension   . GERD (gastroesophageal reflux disease)     occasional  . Environmental allergies     Past Surgical History  Procedure Laterality Date  . Tonsillectomy    . Colonoscopy      Prior to Admission medications   Medication Sig Start Date End Date Taking? Authorizing Provider  amLODipine (NORVASC) 5 MG tablet TAKE 1 TABLET BY MOUTH DAILY 10/06/15  Yes Margarita Rana, MD  fluticasone [facility]) 50 MCG/ACT nasal spray Place into the nose. 03/21/15  Yes Historical Provider, MD  aspirin (ASPIRIN CHILDRENS) 81 MG chewable tablet Chew 1 tablet (81 mg total) by mouth daily. 09/19/15   Margarita Rana, MD  Bioflavonoid Products (VITAMIN C) CHEW Chew by mouth. Reported on 11/27/2015    Historical Provider, MD  calcium carbonate (OS-CAL) 600 MG TABS tablet Take 1 tablet by mouth daily.    Historical Provider, MD  cetirizine (ZYRTEC) 10 MG tablet Take 1 tablet by mouth daily. 01/02/10   Historical Provider, MD  Cholecalciferol (VITAMIN D) 2000 UNITS CAPS Take by mouth.    Historical Provider, MD  montelukast (SINGULAIR) 10 MG tablet Take by mouth. 03/27/15   Historical Provider, MD  Multiple Vitamins-Minerals (OCUVITE ADULT 50+ PO) Take by mouth.    Historical Provider, MD  OMEGA-3 FATTY ACIDS PO FISH-EPA, 1000MG  (Oral Capsule)  2 po bid for 0 days  Quantity: 120.00;  Refills: 0   Ordered :24-Dec-2010  Margarita Rana MD;  Started 08-Apr-2010 Active Comments: DX: 272.0 04/08/10   Historical Provider, MD  Psyllium 48.57 % POWD METAMUCIL, 48.57% (Oral Powder)  QHS, As Directed for 0 days  Quantity: 0.00;  Refills: 0   Ordered :24-Dec-2010  Doy Hutching ;  Started 10-Apr-2010 Active 04/10/10   Historical Provider, MD    Allergies as of 11/19/2015  . (No Known Allergies)    Family History  Problem Relation Age of Onset  . Hypertension Father   . Colon cancer Father   . Alzheimer's disease Other   . Diabetes Other   . Colon cancer Other   . Diabetes Mother   . Heart Problems Brother     Congential heart defect    Social History   Social History  . Marital Status: Single    Spouse Name: N/A  . Number of Children: N/A  . 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 Smoking status: Never Smoker   . Smokeless tobacco: Never Used  . 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 Drug Use: No  . Sexual Activity: Not on file   Other Topics Concern  . Not on file   Social History Narrative    Review of Systems: See HPI, otherwise negative ROS  Physical Exam: BP 129/64 mmHg  Pulse 67  Temp(Src) 97.9 F (36.6 C) (Temporal)  Resp 17  Ht 5\' 1"  (1.549 m)  Wt 203 lb (92.08 kg)  BMI 38.38 kg/m2  SpO2 95% General:   Alert,  pleasant and cooperative in NAD  Head:  Normocephalic and atraumatic. Neck:  Supple; no masses or thyromegaly. Lungs:  Clear throughout to auscultation.    Heart:  Regular rate and rhythm. Abdomen:  Soft, nontender and nondistended. Normal bowel sounds, without guarding, and without rebound.   Neurologic:  Alert and  oriented x4;  grossly normal neurologically.  Impression/Plan: Carrie Wheeler is here for an colonoscopy to be performed for hematochezia  Risks, benefits, limitations, and alternatives regarding  colonoscopy have been reviewed with the patient.  Questions have been answered.  All parties agreeable.   Ollen Bowl, MD  11/27/2015, 7:47 AM

## 2015-11-27 NOTE — Anesthesia Procedure Notes (Signed)
Procedure Name: MAC Performed by: Demesha Boorman Pre-anesthesia Checklist: Patient identified, Emergency Drugs available, Suction available, Timeout performed and Patient being monitored Patient Re-evaluated:Patient Re-evaluated prior to inductionOxygen Delivery Method: Nasal cannula Placement Confirmation: positive ETCO2     

## 2015-11-27 NOTE — Anesthesia Postprocedure Evaluation (Signed)
Anesthesia Post Note  Patient: Carrie Wheeler  Procedure(s) Performed: Procedure(s) (LRB): COLONOSCOPY WITH PROPOFOL (N/A)  Patient location during evaluation: PACU Anesthesia Type: MAC Level of consciousness: awake and alert Pain management: pain level controlled Vital Signs Assessment: post-procedure vital signs reviewed and stable Respiratory status: spontaneous breathing, nonlabored ventilation, respiratory function stable and patient connected to nasal cannula oxygen Cardiovascular status: blood pressure returned to baseline and stable Postop Assessment: no signs of nausea or vomiting Anesthetic complications: no    Alisa Graff

## 2015-11-27 NOTE — Anesthesia Preprocedure Evaluation (Signed)
Anesthesia Evaluation  Patient identified by MRN, date of birth, ID band Patient awake    Reviewed: Allergy & Precautions, H&P , NPO status , Patient's Chart, lab work & pertinent test results, reviewed documented beta blocker date and time   Airway Mallampati: II  TM Distance: >3 FB Neck ROM: full    Dental no notable dental hx.    Pulmonary neg pulmonary ROS,    Pulmonary exam normal breath sounds clear to auscultation       Cardiovascular Exercise Tolerance: Good hypertension,  Rhythm:regular Rate:Normal     Neuro/Psych negative neurological ROS  negative psych ROS   GI/Hepatic Neg liver ROS, GERD  ,  Endo/Other  negative endocrine ROS  Renal/GU negative Renal ROS  negative genitourinary   Musculoskeletal   Abdominal   Peds  Hematology negative hematology ROS (+)   Anesthesia Other Findings   Reproductive/Obstetrics negative OB ROS                            Anesthesia Physical Anesthesia Plan  ASA: II  Anesthesia Plan: MAC   Post-op Pain Management:    Induction:   Airway Management Planned:   Additional Equipment:   Intra-op Plan:   Post-operative Plan:   Informed Consent: I have reviewed the patients History and Physical, chart, labs and discussed the procedure including the risks, benefits and alternatives for the proposed anesthesia with the patient or authorized representative who has indicated his/her understanding and acceptance.   Dental Advisory Given  Plan Discussed with: CRNA  Anesthesia Plan Comments:         Anesthesia Quick Evaluation  

## 2015-11-28 ENCOUNTER — Encounter: Payer: Self-pay | Admitting: Gastroenterology

## 2015-12-02 ENCOUNTER — Encounter: Payer: Self-pay | Admitting: Gastroenterology

## 2016-04-03 ENCOUNTER — Other Ambulatory Visit: Payer: Self-pay | Admitting: Family Medicine

## 2016-04-03 DIAGNOSIS — I10 Essential (primary) hypertension: Secondary | ICD-10-CM

## 2016-04-03 DIAGNOSIS — J302 Other seasonal allergic rhinitis: Secondary | ICD-10-CM

## 2016-04-27 IMAGING — MG MM DIGITAL SCREENING BILAT W/ CAD
4 series · 4 of 4 positions shown · non-contrast
Comparison: Previous exam(s).

CLINICAL DATA: Screening.

EXAM:
DIGITAL SCREENING BILATERAL MAMMOGRAM WITH CAD

[L MLO]
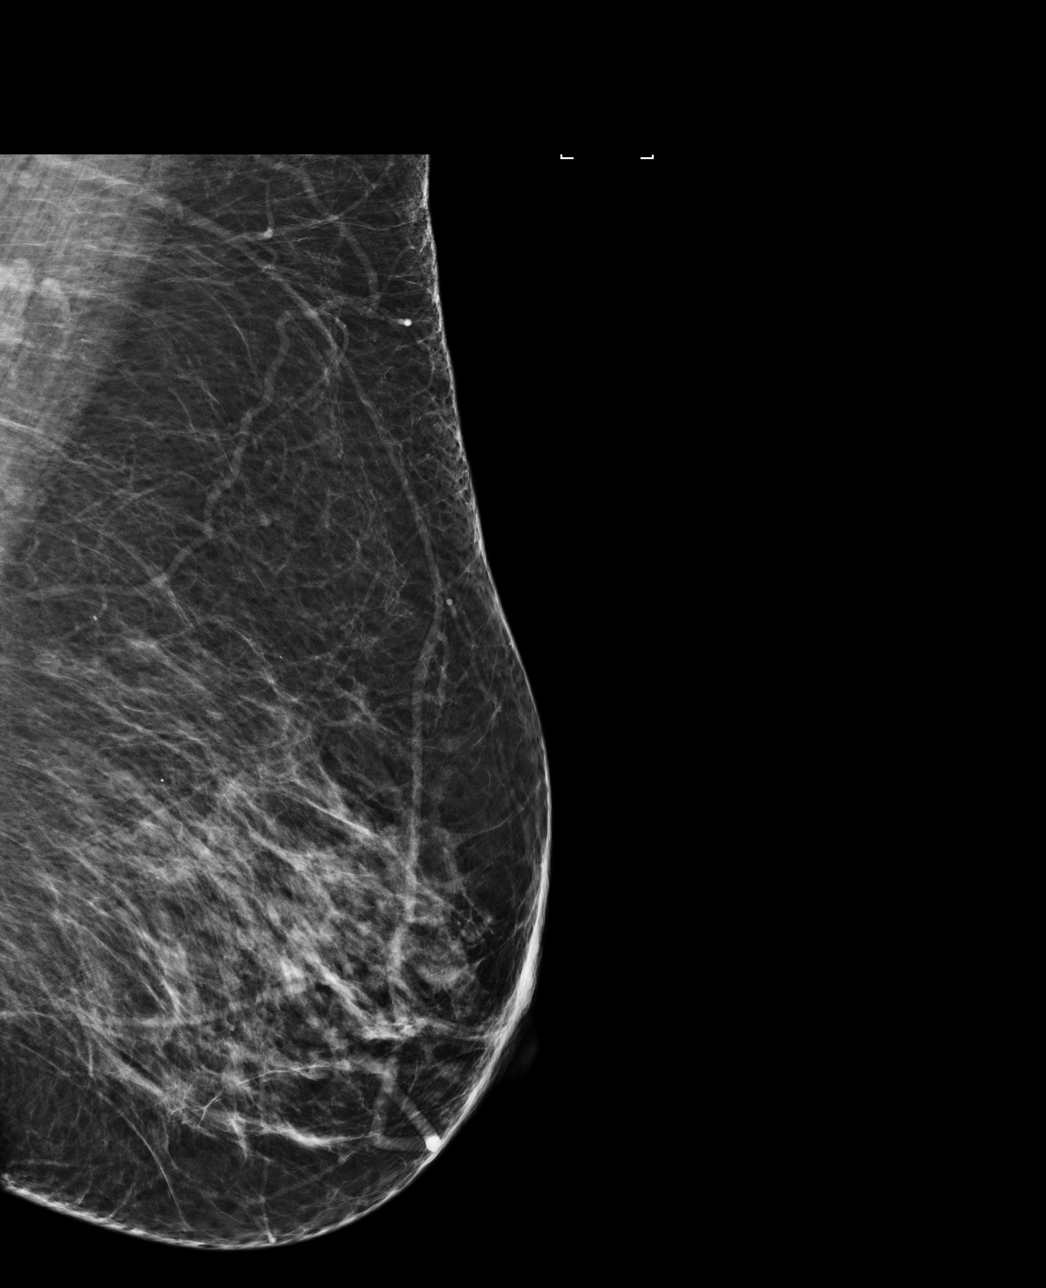

[L CC]
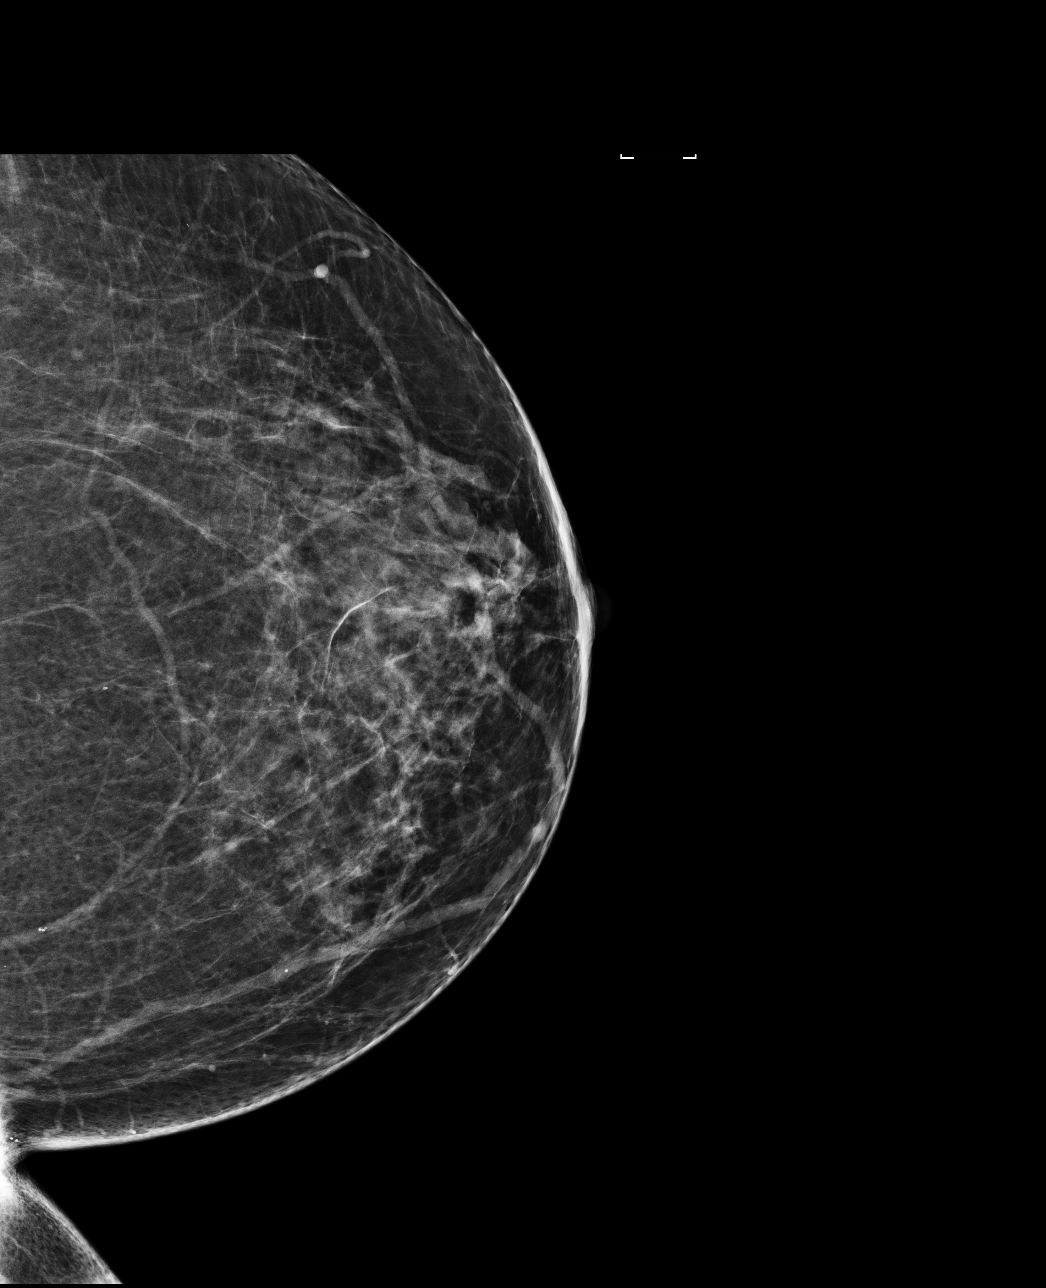

[R CC]
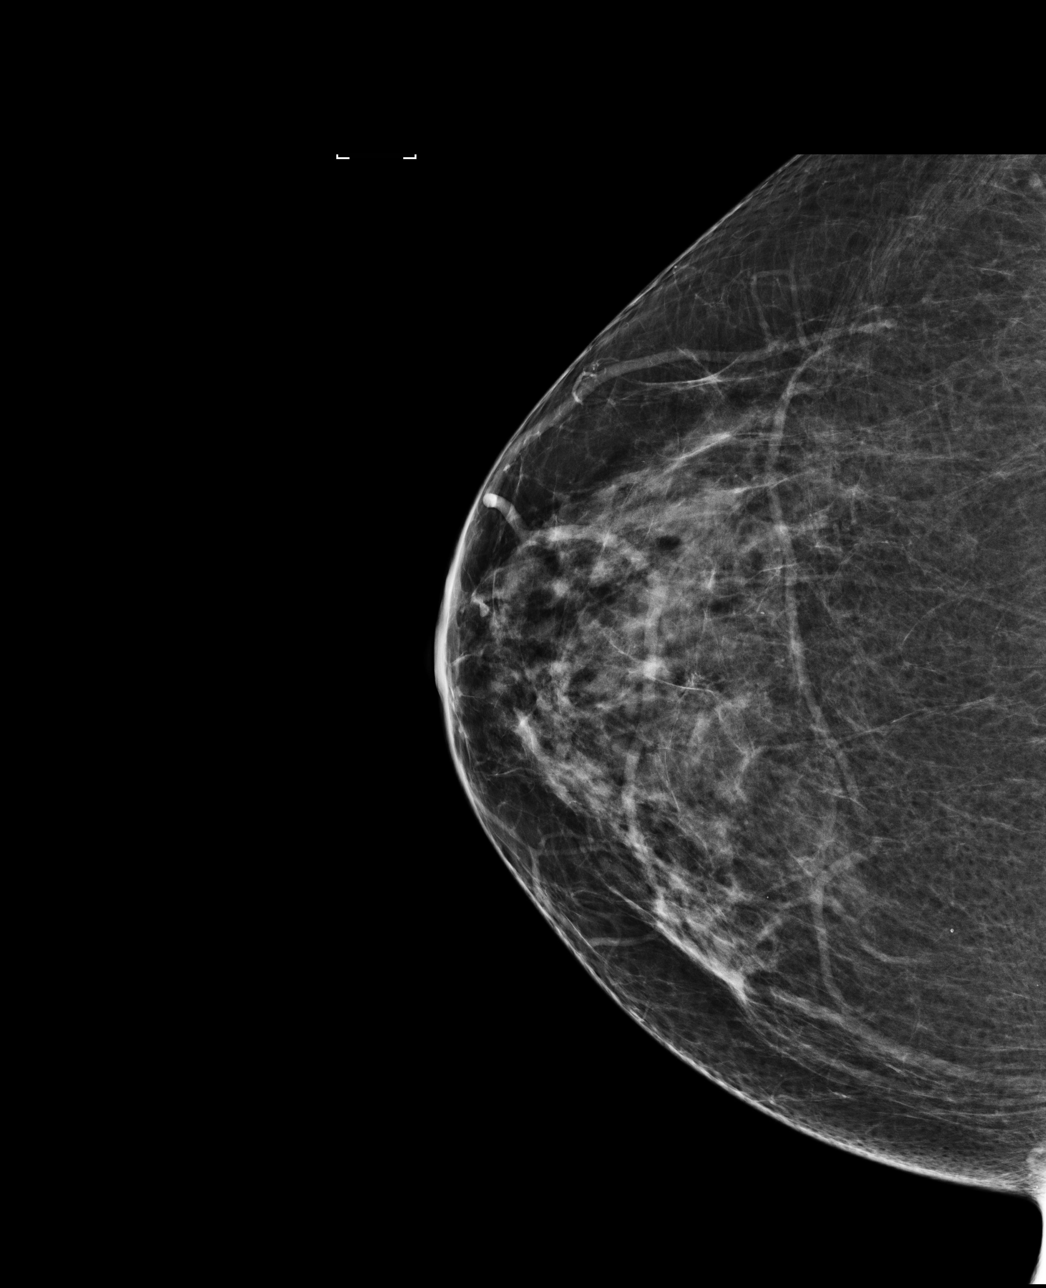

[R MLO]
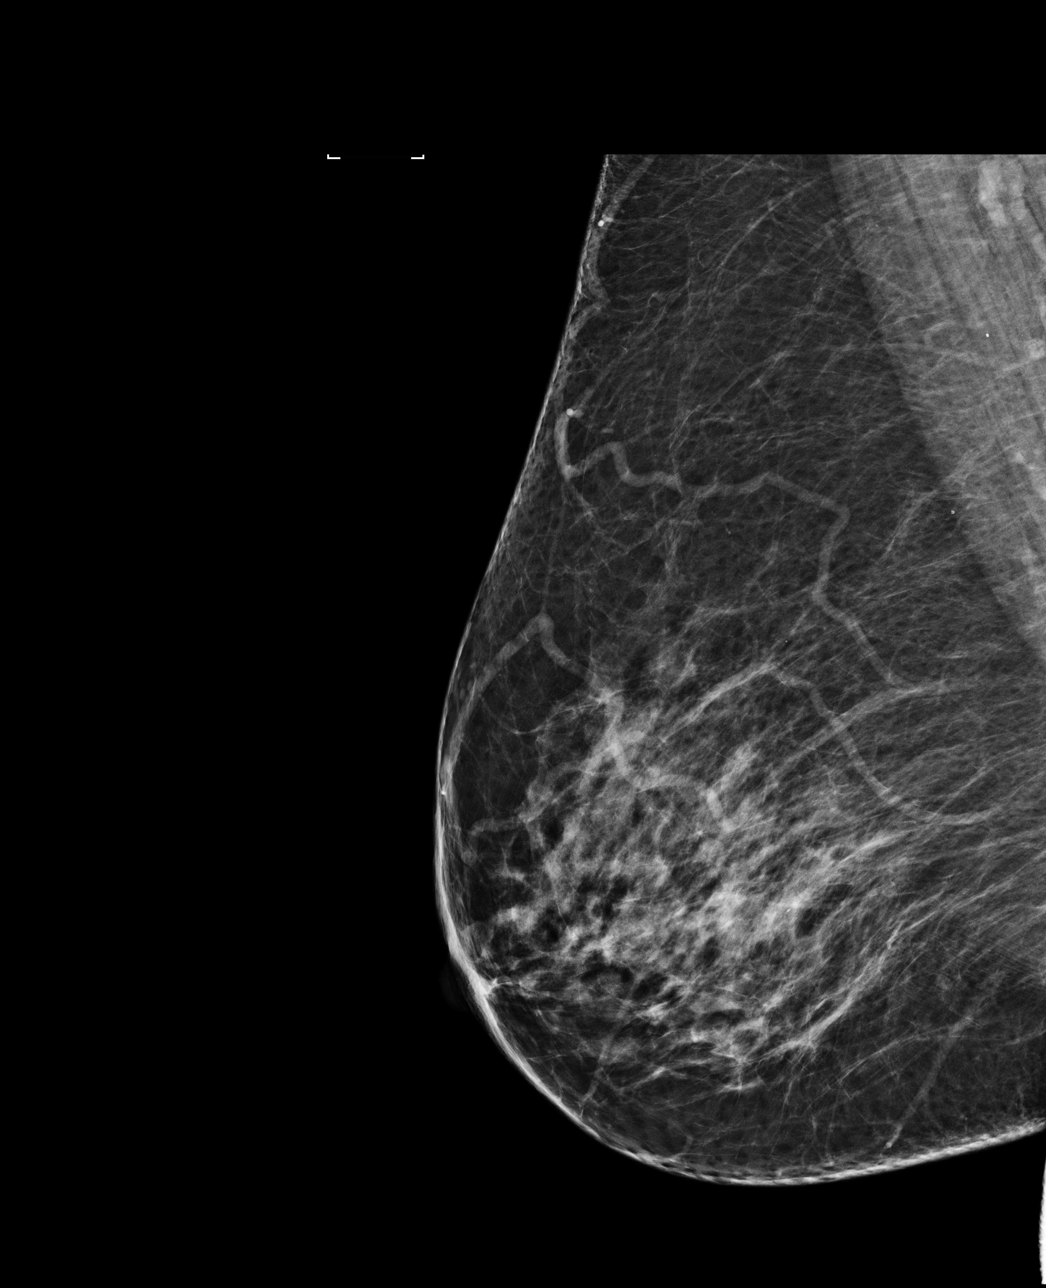

[4 of 4 positions shown; findings below may reference images not displayed]

ACR Breast Density Category b: There are scattered areas of
fibroglandular density.
FINDINGS: There are no findings suspicious for malignancy. Images were
processed with CAD.
IMPRESSION: No mammographic evidence of malignancy. A result letter of this
screening mammogram will be mailed directly to the patient.

RECOMMENDATION:
Screening mammogram in one year. (Code:AS-G-LCT)

BI-RADS CATEGORY  1: Negative.

## 2016-08-30 ENCOUNTER — Ambulatory Visit (INDEPENDENT_AMBULATORY_CARE_PROVIDER_SITE_OTHER): Payer: Medicare Other | Admitting: Physician Assistant

## 2016-08-30 ENCOUNTER — Encounter: Payer: Self-pay | Admitting: Physician Assistant

## 2016-08-30 VITALS — BP 128/80 | HR 68 | Temp 97.7°F | Resp 16 | Ht 60.0 in | Wt 216.0 lb

## 2016-08-30 DIAGNOSIS — I10 Essential (primary) hypertension: Secondary | ICD-10-CM

## 2016-08-30 DIAGNOSIS — Z23 Encounter for immunization: Secondary | ICD-10-CM | POA: Diagnosis not present

## 2016-08-30 DIAGNOSIS — Z126 Encounter for screening for malignant neoplasm of bladder: Secondary | ICD-10-CM

## 2016-08-30 DIAGNOSIS — L309 Dermatitis, unspecified: Secondary | ICD-10-CM

## 2016-08-30 DIAGNOSIS — J302 Other seasonal allergic rhinitis: Secondary | ICD-10-CM | POA: Diagnosis not present

## 2016-08-30 DIAGNOSIS — Z0000 Encounter for general adult medical examination without abnormal findings: Secondary | ICD-10-CM | POA: Diagnosis not present

## 2016-08-30 DIAGNOSIS — E785 Hyperlipidemia, unspecified: Secondary | ICD-10-CM

## 2016-08-30 DIAGNOSIS — R319 Hematuria, unspecified: Secondary | ICD-10-CM

## 2016-08-30 LAB — POCT URINALYSIS DIPSTICK
Bilirubin, UA: NEGATIVE
Glucose, UA: NEGATIVE
Ketones, UA: NEGATIVE
Leukocytes, UA: NEGATIVE
NITRITE UA: NEGATIVE
PH UA: 6
Protein, UA: NEGATIVE
Spec Grav, UA: 1.02
UROBILINOGEN UA: 0.2

## 2016-08-30 MED ORDER — TRIAMCINOLONE ACETONIDE 0.1 % EX CREA: 1.0000 "application " | TOPICAL_CREAM | Freq: Two times a day (BID) | CUTANEOUS | 1 refills | Status: DC

## 2016-08-30 MED ORDER — MONTELUKAST SODIUM 10 MG PO TABS: 10.0000 mg | ORAL_TABLET | Freq: Every day | ORAL | 11 refills | Status: DC

## 2016-08-30 NOTE — Progress Notes (Signed)
Patient: Carrie Wheeler, Female    DOB: Jul 16, 1941, 75 y.o.   MRN: [id number] Visit Date: 08/30/2016  Today's Provider: Mar Daring, PA-C   Chief Complaint  Patient presents with  . Medicare Wellness   Subjective:    Annual wellness visit Carrie Wheeler is a 75 y.o. female. She feels well. She reports exercising active with activities. She reports she is sleeping well. 05-19-15 AWE 04/02/11 Pap-normal 09/01/15 Mammogram-BI-RADS 1 11/27/15 Colonoscopy-polys-recheck in 5 yrs -----------------------------------------------------------   Review of Systems  Constitutional: Negative.   HENT: Negative.   Eyes: Negative.   Respiratory: Negative.   Cardiovascular: Negative.   Gastrointestinal: Negative.   Endocrine: Negative.   Genitourinary: Positive for frequency.  Musculoskeletal: Negative.   Skin: Negative.   Allergic/Immunologic: Negative.   Neurological: Negative.   Hematological: Negative.   Psychiatric/Behavioral: Negative.     Social History   Social History  . Marital status: Single    Spouse name: N/A  . Number of children: N/A  . Years of education: HS   Occupational History  .  Lab [employer]   Social History Main Topics  . Smoking status: Never Smoker  . Smokeless tobacco: Never Used  . 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 Drug use: No  . Sexual activity: Not on file   Other Topics Concern  . Not on file   Social History Narrative  . No narrative on file    Past Medical History:  Diagnosis Date  . Environmental allergies   . GERD (gastroesophageal reflux disease)    occasional  . Hypertension      Patient Active Problem List   Diagnosis Date Noted  . Blood in stool   . Benign neoplasm of ascending colon   . Benign neoplasm of transverse colon   . Heme positive stool 11/18/2015  . Essential (primary) hypertension 03/31/2015  . Extreme obesity ([place]) 03/31/2015   . Hyperlipidemia 01/02/2010  . Avitaminosis D 12/11/2008  . Family history of cancer of digestive system 10/31/2006  . Allergic rhinitis 12/20/2001    Past Surgical History:  Procedure Laterality Date  . COLONOSCOPY    . COLONOSCOPY WITH PROPOFOL N/A 11/27/2015   Procedure: COLONOSCOPY WITH PROPOFOL;  Surgeon: Lucilla Lame, MD;  Location: [place];  Service: Endoscopy;  Laterality: N/A;  . TONSILLECTOMY      Her family history includes Alzheimer's disease in her other; Colon cancer in her father and other; Diabetes in her mother and other; Heart Problems in her brother; Hypertension in her father.    Current Meds  Medication Sig  . amLODipine (NORVASC) 5 MG tablet TAKE 1 TABLET BY MOUTH DAILY  . aspirin (ASPIRIN CHILDRENS) 81 MG chewable tablet Chew 1 tablet (81 mg total) by mouth daily.  Marland Kitchen Bioflavonoid Products (VITAMIN C) CHEW Chew by mouth. Reported on 11/27/2015  . calcium carbonate (OS-CAL) 600 MG TABS tablet Take 1 tablet by mouth daily.  . cetirizine (ZYRTEC) 10 MG tablet Take 1 tablet by mouth daily.  . Cholecalciferol (VITAMIN D) 2000 UNITS CAPS Take by mouth.  . fluticasone (FLONASE) 50 MCG/ACT nasal spray USE 2 SPRAYS NASALLY DAILY  . Lactobacillus (PROBIOTIC ACIDOPHILUS PO) Take 1 tablet by mouth daily.  . montelukast (SINGULAIR) 10 MG tablet Take 1 tablet (10 mg total) by mouth at bedtime.  . Multiple Vitamins-Minerals (OCUVITE ADULT 50+ PO) Take by mouth.  Marland Kitchen  OMEGA-3 FATTY ACIDS PO FISH-EPA, 1000MG  (Oral Capsule)  2 po bid for 0 days  Quantity: 120.00;  Refills: 0   Ordered :24-Dec-2010  Margarita Rana MD;  Started 08-Apr-2010 Active Comments: DX: 272.0  . [DISCONTINUED] montelukast (SINGULAIR) 10 MG tablet Take by mouth.    Patient Care Team: Margarita Rana, MD as PCP - General (Family Medicine)    Objective:   Vitals: BP 128/80 (BP Location: Left Arm, Patient Position: Sitting, Cuff Size: Large)   Pulse 68   Temp 97.7 F (36.5 C) (Oral)   Resp 16    Ht 5' (1.524 m)   Wt 216 lb (98 kg)   BMI 42.18 kg/m   Physical Exam  Constitutional: She is oriented to person, place, and time. She appears well-developed and well-nourished. No distress.  HENT:  Head: Normocephalic and atraumatic.  Right Ear: Tympanic membrane, external ear and ear canal normal.  Left Ear: Tympanic membrane, external ear and ear canal normal.  Nose: Nose normal.  Mouth/Throat: Uvula is midline, oropharynx is clear and moist and mucous membranes are normal. No oropharyngeal exudate.  Eyes: Conjunctivae and EOM are normal. Pupils are equal, round, and reactive to light. Right eye exhibits no discharge. Left eye exhibits no discharge. No scleral icterus.  Neck: Normal range of motion. Neck supple. No JVD present. Carotid bruit is not present. No tracheal deviation present. No thyromegaly present.  Cardiovascular: Normal rate, regular rhythm, normal heart sounds and intact distal pulses.  Exam reveals no gallop and no friction rub.   No murmur heard. Pulmonary/Chest: Effort normal and breath sounds normal. No respiratory distress. She has no wheezes. She has no rales. She exhibits no tenderness. Right breast exhibits no inverted nipple, no mass, no nipple discharge, no skin change and no tenderness. Left breast exhibits no inverted nipple, no mass, no nipple discharge, no skin change and no tenderness. Breasts are symmetrical.  Abdominal: Soft. Bowel sounds are normal. She exhibits no distension and no mass. There is no tenderness. There is no rebound and no guarding.  Musculoskeletal: Normal range of motion. She exhibits no edema or tenderness.  Lymphadenopathy:    She has no cervical adenopathy.  Neurological: She is alert and oriented to person, place, and time. She has normal reflexes.  Skin: Skin is warm and dry. No rash noted. She is not diaphoretic.  Psychiatric: She has a normal mood and affect. Her behavior is normal. Judgment and thought content normal.  Vitals  reviewed.   Activities of Daily Living In your present state of health, do you have any difficulty performing the following activities: 08/30/2016 11/27/2015  Hearing? N N  Vision? N N  Difficulty concentrating or making decisions? N N  Walking or climbing stairs? Y N  Dressing or bathing? N N  Doing errands, shopping? N -  Some recent data might be hidden    Fall Risk Assessment Fall Risk  08/30/2016 05/19/2015  Falls in the past year? No No     Depression Screen PHQ 2/9 Scores 08/30/2016 05/19/2015  PHQ - 2 Score 0 0    Cognitive Testing - 6-CIT  Correct? Score   What year is it? yes 0 0 or 4  What month is it? yes 0 0 or 3  Memorize:    Pia Mau,  44,  [place],      What time is it? (within 1 hour) yes 0 0 or 3  Count backwards from 20 yes 0 0, 2, or 4  Name the months of the year yes 0 0, 2, or 4  Repeat name & address above no 4 0, 2, 4, 6, 8, or 10       TOTAL SCORE  4/28   Interpretation:  Normal  Normal (0-7) Abnormal (8-28)       Assessment & Plan:     Annual Wellness Visit  Reviewed patient's Family Medical History Reviewed and updated list of patient's medical providers Assessment of cognitive impairment was done Assessed patient's functional ability Established a written schedule for health screening [place] [facility] [place] Completed and Reviewed  Exercise Activities and Dietary recommendations Goals    None      Immunization History  Administered Date(s) Administered  . Pneumococcal Conjugate-13 05/19/2015  . Pneumococcal Polysaccharide-23 04/02/2011  . Td 08/30/2016    Health Maintenance  Topic Date Due  . TETANUS/TDAP  06/01/2016  . INFLUENZA VACCINE  07/06/2016  . ZOSTAVAX  08/30/2017 (Originally 12/05/2003)  . MAMMOGRAM  08/31/2017  . COLONOSCOPY  11/26/2020  . DEXA SCAN  Completed  . PNA vac Low Risk Adult  Completed      Discussed health benefits of physical activity, and encouraged her to engage in  regular exercise appropriate for her age and condition.   1. Medicare annual wellness visit, subsequent Normal physical exam today.  2. Bladder cancer screening UA was positive foe hematuria. Will send for microscopy and f/u pending results. - POCT Urinalysis Dipstick  3. Essential (primary) hypertension Stable. Will check labs as below and f/u pending results. - CBC with Differential/Platelet - Comprehensive metabolic panel  4. Hyperlipidemia Stable. Will check labs as below and f/u pending results. - Lipid Panel With LDL/HDL Ratio - TSH  5. Other seasonal allergic rhinitis Stable. Diagnosis pulled for medication refill. Continue current medical treatment plan. - montelukast (SINGULAIR) 10 MG tablet; Take 1 tablet (10 mg total) by mouth at bedtime.  Dispense: 30 tablet; Refill: 11  6. Eczema Worsening eczema on the left nipple and along the pant line on the back. I will prescribe triamcinolone as below. She is to call if symptoms do not improve. - triamcinolone cream (KENALOG) 0.1 %; Apply 1 application topically 2 (two) times daily.  Dispense: 80 g; Refill: 1  7. Hematuria Urinalysis in the office was positive for hematuria. I will send urine for microscopy. I will follow-up pending results. - Urinalysis, microscopic only  8. Need for tetanus booster Td vaccine given to patient without complications. Patient sat for 15 minutes after administration and was tolerated well without adverse effects. - Td : Tetanus/diphtheria >7yo Preservative  free   ------------------------------------------------------------------------------------------------------------    Mar Daring, PA-C  [facility]

## 2016-08-30 NOTE — Patient Instructions (Signed)

## 2016-08-31 ENCOUNTER — Telehealth: Payer: Self-pay

## 2016-08-31 LAB — LIPID PANEL WITH LDL/HDL RATIO
CHOLESTEROL TOTAL: 217 mg/dL — AB (ref 100–199)
HDL: 61 mg/dL (ref >39)
LDL CALC: 139 mg/dL — AB (ref 0–99)
LDl/HDL Ratio: 2.3 ratio units (ref 0.0–3.2)
TRIGLYCERIDES: 85 mg/dL (ref 0–149)
VLDL Cholesterol Cal: 17 mg/dL (ref 5–40)

## 2016-08-31 LAB — COMPREHENSIVE METABOLIC PANEL
A/G RATIO: 1.7 (ref 1.2–2.2)
ALBUMIN: 4.3 g/dL (ref 3.5–4.8)
ALK PHOS: 99 IU/L (ref 39–117)
ALT: 13 IU/L (ref 0–32)
AST: 18 IU/L (ref 0–40)
BILIRUBIN TOTAL: 0.4 mg/dL (ref 0.0–1.2)
BUN / CREAT RATIO: 24 (ref 12–28)
BUN: 18 mg/dL (ref 8–27)
CO2: 27 mmol/L (ref 18–29)
CREATININE: 0.75 mg/dL (ref 0.57–1.00)
Calcium: 9.5 mg/dL (ref 8.7–10.3)
Chloride: 100 mmol/L (ref 96–106)
GFR calc Af Amer: 92 mL/min/{1.73_m2} (ref >59)
GFR calc non Af Amer: 80 mL/min/{1.73_m2} (ref >59)
GLOBULIN, TOTAL: 2.6 g/dL (ref 1.5–4.5)
Glucose: 95 mg/dL (ref 65–99)
POTASSIUM: 4.9 mmol/L (ref 3.5–5.2)
SODIUM: 140 mmol/L (ref 134–144)
Total Protein: 6.9 g/dL (ref 6.0–8.5)

## 2016-08-31 LAB — CBC WITH DIFFERENTIAL/PLATELET
BASOS ABS: 0 10*3/uL (ref 0.0–0.2)
Basos: 0 %
EOS (ABSOLUTE): 0.3 10*3/uL (ref 0.0–0.4)
Eos: 3 %
HEMOGLOBIN: 13.6 g/dL (ref 11.1–15.9)
Hematocrit: 41.1 % (ref 34.0–46.6)
IMMATURE GRANS (ABS): 0 10*3/uL (ref 0.0–0.1)
IMMATURE GRANULOCYTES: 0 %
LYMPHS: 38 %
Lymphocytes Absolute: 3.7 10*3/uL — ABNORMAL HIGH (ref 0.7–3.1)
MCH: 30.6 pg (ref 26.6–33.0)
MCHC: 33.1 g/dL (ref 31.5–35.7)
MCV: 92 fL (ref 79–97)
MONOCYTES: 6 %
Monocytes Absolute: 0.5 10*3/uL (ref 0.1–0.9)
NEUTROS PCT: 53 %
Neutrophils Absolute: 5.2 10*3/uL (ref 1.4–7.0)
Platelets: 271 10*3/uL (ref 150–379)
RBC: 4.45 x10E6/uL (ref 3.77–5.28)
RDW: 14.1 % (ref 12.3–15.4)
WBC: 9.7 10*3/uL (ref 3.4–10.8)

## 2016-08-31 LAB — URINALYSIS, MICROSCOPIC ONLY
Bacteria, UA: NONE SEEN
CASTS: NONE SEEN /LPF

## 2016-08-31 LAB — TSH: TSH: 2.59 u[IU]/mL (ref 0.450–4.500)

## 2016-08-31 NOTE — Telephone Encounter (Signed)
-----   Message from Mar Daring, PA-C sent at 08/31/2016  8:16 AM EDT ----- Cholesterol is improving. Continue lifestyle modifications and fish oil. All other labs stable. No RBC were seen on microscopy so it was a false positive.

## 2016-08-31 NOTE — Telephone Encounter (Signed)
Patient advised as below. Patient verbalizes understanding and is in agreement with treatment plan.  

## 2016-10-02 ENCOUNTER — Other Ambulatory Visit: Payer: Self-pay | Admitting: Family Medicine

## 2016-10-02 DIAGNOSIS — I10 Essential (primary) hypertension: Secondary | ICD-10-CM

## 2016-12-04 ENCOUNTER — Other Ambulatory Visit: Payer: Self-pay | Admitting: Family Medicine

## 2016-12-04 DIAGNOSIS — J302 Other seasonal allergic rhinitis: Secondary | ICD-10-CM

## 2017-04-16 ENCOUNTER — Other Ambulatory Visit: Payer: Self-pay | Admitting: Physician Assistant

## 2017-04-16 DIAGNOSIS — I10 Essential (primary) hypertension: Secondary | ICD-10-CM

## 2017-07-21 ENCOUNTER — Telehealth: Payer: Self-pay | Admitting: Physician Assistant

## 2017-07-21 DIAGNOSIS — Z1239 Encounter for other screening for malignant neoplasm of breast: Secondary | ICD-10-CM

## 2017-07-21 NOTE — Telephone Encounter (Signed)
Pt needs to schd a mammogram.  She tried to schd one but they said they needed an older before she could.  Thanks, [employer]

## 2017-07-21 NOTE — Telephone Encounter (Signed)
Mammogram ordered. Patient needs AWV/CPE after 08/30/17.

## 2017-07-22 NOTE — Telephone Encounter (Signed)
Pt advised, pt already has an appointment with Dr Maxwell Caul

## 2017-08-30 ENCOUNTER — Telehealth: Payer: Self-pay | Admitting: Physician Assistant

## 2017-09-12 ENCOUNTER — Ambulatory Visit (INDEPENDENT_AMBULATORY_CARE_PROVIDER_SITE_OTHER): Payer: Medicare Other | Admitting: Family Medicine

## 2017-09-12 ENCOUNTER — Encounter: Payer: Self-pay | Admitting: Family Medicine

## 2017-09-12 VITALS — BP 140/68 | HR 68 | Temp 97.4°F | Resp 16 | Ht 60.0 in | Wt 216.0 lb

## 2017-09-12 DIAGNOSIS — M546 Pain in thoracic spine: Secondary | ICD-10-CM | POA: Diagnosis not present

## 2017-09-12 DIAGNOSIS — E559 Vitamin D deficiency, unspecified: Secondary | ICD-10-CM

## 2017-09-12 DIAGNOSIS — Z0000 Encounter for general adult medical examination without abnormal findings: Secondary | ICD-10-CM | POA: Diagnosis not present

## 2017-09-12 DIAGNOSIS — E7800 Pure hypercholesterolemia, unspecified: Secondary | ICD-10-CM

## 2017-09-12 DIAGNOSIS — I10 Essential (primary) hypertension: Secondary | ICD-10-CM | POA: Diagnosis not present

## 2017-09-12 DIAGNOSIS — Z23 Encounter for immunization: Secondary | ICD-10-CM

## 2017-09-12 NOTE — Assessment & Plan Note (Signed)
Slightly above goal, patient has not taken her medications yet today Continue amlodipine Check CMP Follow-up in 6 months

## 2017-09-12 NOTE — Progress Notes (Signed)
Patient: Carrie Wheeler, Female    DOB: 1941-10-31, 76 y.o.   MRN: [id number] Visit Date: 09/12/2017  Today's Provider: [name], MD   Chief Complaint  Patient presents with  . Medicare Wellness   Subjective:    Annual wellness visit Carrie Wheeler is a 76 y.o. female. She feels well. She is c/o back pain; saw a chiropractor who did an adjustment, with some short-term relief. She reports exercising some. Chiropractor said she had a rib out of joint.  Pain is between her shoulder blades more to the left side. 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 She walks for about 10 minutes twice weekly; finds it difficult to exercise more due to leg pain. States that she tries to eat a healthy and low sodium diet, but this is difficult because she eats out a lot. She reports she is sleeping fairly well.  Last AWV- 08/30/2016 Last mammogram- 11/27/2015- BI-RADS 1 Last colonoscopy - tubular adenoma- repeat 5 years Last BMD- 10/30/2014- normal. -----------------------------------------------------------   Review of Systems  Constitutional: Negative.   HENT: Negative.   Eyes: Negative.   Respiratory: Negative.   Cardiovascular: Negative.   Gastrointestinal: Negative.   Endocrine: Negative.   Genitourinary: Negative.   Musculoskeletal: Positive for back pain. Negative for arthralgias, gait problem, joint swelling, myalgias, neck pain and neck stiffness.  Skin: Negative.   Allergic/Immunologic: Negative.   Neurological: Negative.   Hematological: Negative.   Psychiatric/Behavioral: Negative.     Social History   Social History  . Marital status: Single    Spouse name: N/A  . Number of children: 0  . Years of education: HS   Occupational History  .  Lab [employer]   Social History Main Topics  . Smoking status: Never Smoker  . Smokeless tobacco: Never Used  . 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 Drug use: No  . Sexual activity: Not Currently   Other Topics Concern  . Not on file   Social History Narrative   Had one child; a son who is now deceased due to liver disease.    Past Medical History:  Diagnosis Date  . Environmental allergies   . GERD (gastroesophageal reflux disease)    occasional  . Hypertension      Patient Active Problem List   Diagnosis Date Noted  . Acute left-sided thoracic back pain 09/12/2017  . Benign neoplasm of ascending colon   . Benign neoplasm of transverse colon   . Essential (primary) hypertension 03/31/2015  . Morbid obesity ([place]) 03/31/2015  . Hyperlipidemia 01/02/2010  . Avitaminosis D 12/11/2008  . Family history of cancer of digestive system 10/31/2006  . Allergic rhinitis 12/20/2001    Past Surgical History:  Procedure Laterality Date  . COLONOSCOPY    . COLONOSCOPY WITH PROPOFOL N/A 11/27/2015   Procedure: COLONOSCOPY WITH PROPOFOL;  Surgeon: Lucilla Lame, MD;  Location: [place];  Service: Endoscopy;  Laterality: N/A;  . TONSILLECTOMY      Her family history includes Alzheimer's disease in her other; Colon cancer in her father and other; Diabetes in her mother and other; Healthy in her brother; Heart Problems in her brother; Hypertension in her father; Liver disease in her son.      Current Outpatient Prescriptions:  .  amLODipine (NORVASC) 5 MG  tablet, TAKE 1 TABLET BY MOUTH EVERY DAY, Disp: 90 tablet, Rfl: 1 .  aspirin (ASPIRIN CHILDRENS) 81 MG chewable tablet, Chew 1 tablet (81 mg total) by mouth daily., Disp: 1 tablet, Rfl: 0 .  Bioflavonoid Products (VITAMIN C) CHEW, Chew by mouth. Reported on 11/27/2015, Disp: , Rfl:  .  calcium carbonate (OS-CAL) 600 MG TABS tablet, Take 1 tablet by mouth daily., Disp: , Rfl:  .  cetirizine (ZYRTEC) 10 MG tablet, Take 1 tablet by mouth daily., Disp: , Rfl:  .  Cholecalciferol (VITAMIN D) 2000 UNITS CAPS, Take by mouth., Disp: , Rfl:  .   fluticasone (FLONASE) 50 MCG/ACT nasal spray, INSTILL 2 SPRAYS INTO EACH NOSTRIL EVERY DAY, Disp: 48 g, Rfl: 1 .  Lactobacillus (PROBIOTIC ACIDOPHILUS PO), Take 1 tablet by mouth daily., Disp: , Rfl:  .  montelukast (SINGULAIR) 10 MG tablet, TAKE 1 TABLET BY MOUTH DAILY, Disp: 90 tablet, Rfl: 1 .  Multiple Vitamins-Minerals (OCUVITE ADULT 50+ PO), Take by mouth., Disp: , Rfl:  .  OMEGA-3 FATTY ACIDS PO, FISH-EPA, 1000MG (Oral Capsule)  2 po bid for 0 days  Quantity: 120.00;  Refills: 0   Ordered :24-Dec-2010  Margarita Rana MD;  Started 08-Apr-2010 Active Comments: DX: 272.0, Disp: , Rfl:  .  Psyllium 48.57 % POWD, METAMUCIL, 48.57% (Oral Powder)  QHS, As Directed for 0 days  Quantity: 0.00;  Refills: 0   Ordered :24-Dec-2010  Doy Hutching ;  Started 10-Apr-2010 Active, Disp: , Rfl:  .  triamcinolone cream (KENALOG) 0.1 %, Apply 1 application topically 2 (two) times daily., Disp: 80 g, Rfl: 1  Patient Care Team: Mar Daring, PA-C as PCP - General (Family Medicine)     Objective:   Vitals: BP 140/68 (BP Location: Left Arm, Patient Position: Sitting, Cuff Size: Large)   Pulse 68   Temp (!) 97.4 F (36.3 C) (Oral)   Resp 16   Ht 5' (1.524 m)   Wt 216 lb (98 kg)   BMI 42.18 kg/m   Physical Exam  Constitutional: She appears well-developed and well-nourished. No distress.  HENT:  Head: Normocephalic and atraumatic.  Right Ear: External ear normal.  Left Ear: External ear normal.  Nose: Nose normal.  Mouth/Throat: Oropharynx is clear and moist.  Eyes: Pupils are equal, round, and reactive to light. Conjunctivae are normal. No scleral icterus.  Neck: Neck supple. No thyromegaly present.  Cardiovascular: Normal rate, regular rhythm, normal heart sounds and intact distal pulses.   No murmur heard. Pulmonary/Chest: Effort normal and breath sounds normal. No respiratory distress. She has no wheezes. She has no rales.  Abdominal: Soft. Bowel sounds are normal. She exhibits no  distension. There is no tenderness. There is no rebound and no guarding.  Musculoskeletal: She exhibits no edema or deformity.  Lymphadenopathy:    She has no cervical adenopathy.  Neurological: She is alert.  Skin: Skin is warm and dry. No rash noted.  Psychiatric: She has a normal mood and affect. Her behavior is normal.  Vitals reviewed.   Activities of Daily Living In your present state of health, do you have any difficulty performing the following activities: 09/12/2017  Hearing? N  Vision? N  Difficulty concentrating or making decisions? N  Walking or climbing stairs? N  Dressing or bathing? N  Doing errands, shopping? N  Some recent data might be hidden    Fall Risk Assessment Fall Risk  09/12/2017 08/30/2016 05/19/2015  Falls in the past year? No No No  Depression Screen PHQ 2/9 Scores 09/12/2017 08/30/2016 05/19/2015  PHQ - 2 Score 0 0 0    Cognitive Testing - 6-CIT  Correct? Score   What year is it? yes 0 0 or 4  What month is it? yes 0 0 or 3  Memorize:    Pia Mau,  44,  [address]  [place],      What time is it? (within 1 hour) yes 0 0 or 3  Count backwards from 20 yes 0 0, 2, or 4  Name the months of the year yes 0 0, 2, or 4  Repeat name & address above yes 0 0, 2, 4, 6, 8, or 10       TOTAL SCORE  0/28   Interpretation:  Normal  Normal (0-7) Abnormal (8-28)   Audit-C Alcohol Use Screening  Question Answer Points  How often do you have alcoholic drink? never 0  On days you do drink alcohol, how many drinks do you typically consume? 0 0  How oftey will you drink 6 or more in a total? never 0  Total Score:  0   A score of 3 or more in women, and 4 or more in men indicates increased risk for alcohol abuse, EXCEPT if all of the points are from question 1.      Assessment & Plan:     Annual Wellness Visit  Reviewed patient's Family Medical History Reviewed and updated list of patient's medical providers Assessment of cognitive impairment was  done Assessed patient's functional ability Established a written schedule for health screening [place] Completed and Reviewed  Exercise Activities and Dietary recommendations Goals    None      Immunization History  Administered Date(s) Administered  . Pneumococcal Conjugate-13 05/19/2015  . Pneumococcal Polysaccharide-23 04/02/2011  . Td 06/01/2006, 08/30/2016  . Tdap 06/01/2006    Health Maintenance  Topic Date Due  . INFLUENZA VACCINE  07/06/2017  . MAMMOGRAM  08/31/2017  . COLONOSCOPY  11/26/2020  . TETANUS/TDAP  08/30/2026  . DEXA SCAN  Completed  . PNA vac Low Risk Adult  Completed     Discussed health benefits of physical activity, and encouraged her to engage in regular exercise appropriate for her age and condition.  Mammogram scheduled for tomorrow Flu shot given today  Problem List Items Addressed This Visit      Cardiovascular and Mediastinum   Essential (primary) hypertension    Slightly above goal, patient has not taken her medications yet today Continue amlodipine Check CMP Follow-up in 6 months      Relevant Orders   CMP14+EGFR     Other   Hyperlipidemia    Recheck lipid panel Not currently on a statin Continue daily aspirin      Relevant Orders   CMP14+EGFR   Lipid panel   Morbid obesity ([place])    Discussed healthy diet and exercise      Relevant Orders   CBC w/Diff/Platelet   Avitaminosis D    Recheck vitamin D level      Relevant Orders   VITAMIN D 25 Hydroxy (Vit-D Deficiency, Fractures)   Acute left-sided thoracic back pain    Musculature on left side of thoracic spine is tense which suggests muscle spasm as etiology Discuss ice/heat, massage, stretching, Tylenol as needed for pain Return precautions discussed       Other Visit Diagnoses    Medicare annual wellness visit, subsequent    -  Primary          ------------------------------------------------------------------------------------------------------------  The entirety of the information documented in the History of Present Illness, Review of Systems and Physical Exam were personally obtained by me. Portions of this information were initially documented by Raquel Sarna Ratchford, CMA and reviewed by me for thoroughness and accuracy.    [name], MD  [facility]

## 2017-09-12 NOTE — Assessment & Plan Note (Signed)
Discussed healthy diet and exercise

## 2017-09-12 NOTE — Patient Instructions (Signed)
Preventive Care 65 Years and Older, Female Preventive care refers to lifestyle choices and visits with your health care provider that can promote health and wellness. What does preventive care include?  A yearly physical exam. This is also called an annual well check.  Dental exams once or twice a year.  Routine eye exams. Ask your health care provider how often you should have your eyes checked.  Personal lifestyle choices, including: ? Daily care of your teeth and gums. ? Regular physical activity. ? Eating a healthy diet. ? Avoiding tobacco and drug use. ? Limiting alcohol use. ? Practicing safe sex. ? Taking low-dose aspirin every day. ? Taking vitamin and mineral supplements as recommended by your health care provider. What happens during an annual well check? The services and screenings done by your health care provider during your annual well check will depend on your age, overall health, lifestyle risk factors, and family history of disease. Counseling Your health care provider may ask you questions about your:  Alcohol use.  Tobacco use.  Drug use.  Emotional well-being.  Home and relationship well-being.  Sexual activity.  Eating habits.  History of falls.  Memory and ability to understand (cognition).  Work and work environment.  Reproductive health.  Screening You may have the following tests or measurements:  Height, weight, and BMI.  Blood pressure.  Lipid and cholesterol levels. These may be checked every 5 years, or more frequently if you are over 50 years old.  Skin check.  Lung cancer screening. You may have this screening every year starting at age 55 if you have a 30-pack-year history of smoking and currently smoke or have quit within the past 15 years.  Fecal occult blood test (FOBT) of the stool. You may have this test every year starting at age 50.  Flexible sigmoidoscopy or colonoscopy. You may have a sigmoidoscopy every 5 years or  a colonoscopy every 10 years starting at age 50.  Hepatitis C blood test.  Hepatitis B blood test.  Sexually transmitted disease (STD) testing.  Diabetes screening. This is done by checking your blood sugar (glucose) after you have not eaten for a while (fasting). You may have this done every 1-3 years.  Bone density scan. This is done to screen for osteoporosis. You may have this done starting at age 65.  Mammogram. This may be done every 1-2 years. Talk to your health care provider about how often you should have regular mammograms.  Talk with your health care provider about your test results, treatment options, and if necessary, the need for more tests. Vaccines Your health care provider may recommend certain vaccines, such as:  Influenza vaccine. This is recommended every year.  Tetanus, diphtheria, and acellular pertussis (Tdap, Td) vaccine. You may need a Td booster every 10 years.  Varicella vaccine. You may need this if you have not been vaccinated.  Zoster vaccine. You may need this after age 60.  Measles, mumps, and rubella (MMR) vaccine. You may need at least one dose of MMR if you were born in 1957 or later. You may also need a second dose.  Pneumococcal 13-valent conjugate (PCV13) vaccine. One dose is recommended after age 65.  Pneumococcal polysaccharide (PPSV23) vaccine. One dose is recommended after age 65.  Meningococcal vaccine. You may need this if you have certain conditions.  Hepatitis A vaccine. You may need this if you have certain conditions or if you travel or work in places where you may be exposed to hepatitis   A.  Hepatitis B vaccine. You may need this if you have certain conditions or if you travel or work in places where you may be exposed to hepatitis B.  Haemophilus influenzae type b (Hib) vaccine. You may need this if you have certain conditions.  Talk to your health care provider about which screenings and vaccines you need and how often you  need them. This information is not intended to replace advice given to you by your health care provider. Make sure you discuss any questions you have with your health care provider. Document Released: 12/19/2015 Document Revised: 08/11/2016 Document Reviewed: 09/23/2015 Elsevier Interactive Patient Education  2017 [employer] Neck Exercises Neck exercises can be important for many reasons:  They can help you to improve and maintain flexibility in your neck. This can be especially important as you age.  They can help to make your neck stronger. This can make movement easier.  They can reduce or prevent neck pain.  They may help your upper back.  Ask your health care provider which neck exercises would be best for you. Exercises Neck Press Repeat this exercise 10 times. Do it first thing in the morning and right before bed or as told by your health care provider. 1. Lie on your back on a firm bed or on the floor with a pillow under your head. 2. Use your neck muscles to push your head down on the pillow and straighten your spine. 3. Hold the position as well as you can. Keep your head facing up and your chin tucked. 4. Slowly count to 5 while holding this position. 5. Relax for a few seconds. Then repeat.  Isometric Strengthening Do a full set of these exercises 2 times a day or as told by your health care provider. 1. Sit in a supportive chair and place your hand on your forehead. 2. Push forward with your head and neck while pushing back with your hand. Hold for 10 seconds. 3. Relax. Then repeat the exercise 3 times. 4. Next, do thesequence again, this time putting your hand against the back of your head. Use your head and neck to push backward against the hand pressure. 5. Finally, do the same exercise on either side of your head, pushing sideways against the pressure of your hand.  Prone Head Lifts Repeat this exercise 5 times. Do this 2 times a day or as told by your health  care provider. 1. Lie face-down, resting on your elbows so that your chest and upper back are raised. 2. Start with your head facing downward, near your chest. Position your chin either on or near your chest. 3. Slowly lift your head upward. Lift until you are looking straight ahead. Then continue lifting your head as far back as you can stretch. 4. Hold your head up for 5 seconds. Then slowly lower it to your starting position.  Supine Head Lifts Repeat this exercise 8-10 times. Do this 2 times a day or as told by your health care provider. 1. Lie on your back, bending your knees to point to the ceiling and keeping your feet flat on the floor. 2. Lift your head slowly off the floor, raising your chin toward your chest. 3. Hold for 5 seconds. 4. Relax and repeat.  Scapular Retraction Repeat this exercise 5 times. Do this 2 times a day or as told by your health care provider. 1. Stand with your arms at your sides. Look straight ahead. 2. Slowly pull both shoulders backward  and downward until you feel a stretch between your shoulder blades in your upper back. 3. Hold for 10-30 seconds. 4. Relax and repeat.  Contact a health care provider if:  Your neck pain or discomfort gets much worse when you do an exercise.  Your neck pain or discomfort does not improve within 2 hours after you exercise. If you have any of these problems, stop exercising right away. Do not do the exercises again unless your health care provider says that you can. Get help right away if:  You develop sudden, severe neck pain. If this happens, stop exercising right away. Do not do the exercises again unless your health care provider says that you can. Exercises Neck Stretch  Repeat this exercise 3-5 times. 1. Do this exercise while standing or while sitting in a chair. 2. Place your feet flat on the floor, shoulder-width apart. 3. Slowly turn your head to the right. Turn it all the way to the right so you can look  over your right shoulder. Do not tilt or tip your head. 4. Hold this position for 10-30 seconds. 5. Slowly turn your head to the left, to look over your left shoulder. 6. Hold this position for 10-30 seconds.  Neck Retraction Repeat this exercise 8-10 times. Do this 3-4 times a day or as told by your health care provider. 1. Do this exercise while standing or while sitting in a sturdy chair. 2. Look straight ahead. Do not bend your neck. 3. Use your fingers to push your chin backward. Do not bend your neck for this movement. Continue to face straight ahead. If you are doing the exercise properly, you will feel a slight sensation in your throat and a stretch at the back of your neck. 4. Hold the stretch for 1-2 seconds. Relax and repeat.  This information is not intended to replace advice given to you by your health care provider. Make sure you discuss any questions you have with your health care provider. Document Released: 11/03/2015 Document Revised: 04/29/2016 Document Reviewed: 06/02/2015 Elsevier Interactive Patient Education  2017 [employer].

## 2017-09-12 NOTE — Assessment & Plan Note (Signed)
Musculature on left side of thoracic spine is tense which suggests muscle spasm as etiology Discuss ice/heat, massage, stretching, Tylenol as needed for pain Return precautions discussed

## 2017-09-12 NOTE — Assessment & Plan Note (Signed)
Recheck vitamin D level 

## 2017-09-12 NOTE — Assessment & Plan Note (Signed)
Recheck lipid panel Not currently on a statin Continue daily aspirin

## 2017-09-13 ENCOUNTER — Ambulatory Visit
Admission: RE | Admit: 2017-09-13 | Discharge: 2017-09-13 | Disposition: A | Discharge status: Home / Self Care | Payer: Medicare Other | Source: Ambulatory Visit | Attending: Physician Assistant | Admitting: Physician Assistant

## 2017-09-13 ENCOUNTER — Telehealth: Payer: Self-pay

## 2017-09-13 DIAGNOSIS — Z1231 Encounter for screening mammogram for malignant neoplasm of breast: Secondary | ICD-10-CM | POA: Diagnosis present

## 2017-09-13 DIAGNOSIS — Z1239 Encounter for other screening for malignant neoplasm of breast: Secondary | ICD-10-CM

## 2017-09-13 LAB — CMP14+EGFR
A/G RATIO: 1.8 (ref 1.2–2.2)
ALK PHOS: 105 IU/L (ref 39–117)
ALT: 16 IU/L (ref 0–32)
AST: 18 IU/L (ref 0–40)
Albumin: 4.2 g/dL (ref 3.5–4.8)
BUN/Creatinine Ratio: 21 (ref 12–28)
BUN: 15 mg/dL (ref 8–27)
Bilirubin Total: 0.5 mg/dL (ref 0.0–1.2)
CO2: 25 mmol/L (ref 20–29)
CREATININE: 0.72 mg/dL (ref 0.57–1.00)
Calcium: 9.2 mg/dL (ref 8.7–10.3)
Chloride: 102 mmol/L (ref 96–106)
GFR calc Af Amer: 96 mL/min/{1.73_m2} (ref >59)
GFR, EST NON AFRICAN AMERICAN: 83 mL/min/{1.73_m2} (ref >59)
GLOBULIN, TOTAL: 2.3 g/dL (ref 1.5–4.5)
Glucose: 97 mg/dL (ref 65–99)
POTASSIUM: 5 mmol/L (ref 3.5–5.2)
Sodium: 141 mmol/L (ref 134–144)
Total Protein: 6.5 g/dL (ref 6.0–8.5)

## 2017-09-13 LAB — CBC WITH DIFFERENTIAL/PLATELET
BASOS: 0 %
Basophils Absolute: 0 10*3/uL (ref 0.0–0.2)
EOS (ABSOLUTE): 0.3 10*3/uL (ref 0.0–0.4)
EOS: 3 %
HEMATOCRIT: 39.4 % (ref 34.0–46.6)
Hemoglobin: 13.4 g/dL (ref 11.1–15.9)
IMMATURE GRANS (ABS): 0 10*3/uL (ref 0.0–0.1)
IMMATURE GRANULOCYTES: 0 %
LYMPHS: 35 %
Lymphocytes Absolute: 3.6 10*3/uL — ABNORMAL HIGH (ref 0.7–3.1)
MCH: 30.5 pg (ref 26.6–33.0)
MCHC: 34 g/dL (ref 31.5–35.7)
MCV: 90 fL (ref 79–97)
MONOCYTES: 6 %
Monocytes Absolute: 0.6 10*3/uL (ref 0.1–0.9)
NEUTROS PCT: 56 %
Neutrophils Absolute: 5.6 10*3/uL (ref 1.4–7.0)
Platelets: 266 10*3/uL (ref 150–379)
RBC: 4.4 x10E6/uL (ref 3.77–5.28)
RDW: 14 % (ref 12.3–15.4)
WBC: 10.1 10*3/uL (ref 3.4–10.8)

## 2017-09-13 LAB — LIPID PANEL
CHOL/HDL RATIO: 3.7 ratio (ref 0.0–4.4)
Cholesterol, Total: 214 mg/dL — ABNORMAL HIGH (ref 100–199)
HDL: 58 mg/dL (ref >39)
LDL Calculated: 141 mg/dL — ABNORMAL HIGH (ref 0–99)
TRIGLYCERIDES: 77 mg/dL (ref 0–149)
VLDL CHOLESTEROL CAL: 15 mg/dL (ref 5–40)

## 2017-09-13 LAB — VITAMIN D 25 HYDROXY (VIT D DEFICIENCY, FRACTURES): VIT D 25 HYDROXY: 30.9 ng/mL (ref 30.0–100.0)

## 2017-09-13 NOTE — Telephone Encounter (Signed)
Pt hesitant to start medication because she doesn't like to take medications. She is asking if there are any alternatives to lowering her cholesterol?

## 2017-09-13 NOTE — Telephone Encounter (Signed)
Pt advised of plan and 3 month FU made.

## 2017-09-13 NOTE — Telephone Encounter (Signed)
Diet low in fried foods and red meat.  Eating healthy fats, such as fish can also help.  30 minutes of aerobic exercise 5 days a week.  Lets still plan to check in in 3 months and see how cholesterol is.    Virginia Crews, MD, MPH [facility] 09/13/2017 10:21 AM

## 2017-09-13 NOTE — Telephone Encounter (Signed)
[email or web address]

## 2017-09-13 NOTE — Telephone Encounter (Signed)
-----   Message from Virginia Crews, MD sent at 09/13/2017  9:02 AM EDT ----- Normal Vit D levels, blood counts, kidney function, liver function, electrolytes.  Cholesterol is high.  10 year risk of heart disease/stroke is high at 20.4%.  I would recommend a statin to lower cholesterol and heart disease risk.  If patient is agreeable, would Rx Atorvastatin 40mg  daily #30 r5.  If she starts this medication, would like to see her back in 3 months for cholesterol f/u.  Virginia Crews, MD, MPH [facility] 09/13/2017 9:02 AM

## 2017-09-13 NOTE — Telephone Encounter (Signed)
-----   Message from Mar Daring, [place] sent at 09/13/2017 10:32 AM EDT ----- Normal mammogram. Repeat screening in one year.

## 2017-09-13 NOTE — Telephone Encounter (Signed)
Patient advised as below.  

## 2017-09-13 NOTE — Telephone Encounter (Signed)
Pt is returning call.  CB#[phone]/MW

## 2017-09-26 ENCOUNTER — Telehealth: Payer: Self-pay | Admitting: Family Medicine

## 2017-09-26 NOTE — Telephone Encounter (Signed)
Pt is requesting a nurse return her call to advised her what her cholesterol levels were and would like to pick up a copy of her lab results. Pt stated unable to sign up for MyChart. Please advise. Thanks TNP

## 2017-09-26 NOTE — Telephone Encounter (Signed)
Left message to call back  

## 2017-09-26 NOTE — Telephone Encounter (Signed)
Advised patient of results.  

## 2017-10-13 ENCOUNTER — Other Ambulatory Visit: Payer: Self-pay | Admitting: Physician Assistant

## 2017-10-13 DIAGNOSIS — I10 Essential (primary) hypertension: Secondary | ICD-10-CM

## 2017-10-13 NOTE — Telephone Encounter (Signed)
LOV 09/12/2017

## 2017-11-13 ENCOUNTER — Other Ambulatory Visit: Payer: Self-pay | Admitting: Physician Assistant

## 2017-11-13 DIAGNOSIS — J302 Other seasonal allergic rhinitis: Secondary | ICD-10-CM

## 2017-11-16 ENCOUNTER — Other Ambulatory Visit: Payer: Self-pay | Admitting: Family Medicine

## 2017-11-16 DIAGNOSIS — J302 Other seasonal allergic rhinitis: Secondary | ICD-10-CM

## 2017-11-23 NOTE — Telephone Encounter (Signed)
Visit complete.

## 2017-12-12 ENCOUNTER — Ambulatory Visit: Payer: Self-pay | Admitting: Family Medicine

## 2017-12-14 ENCOUNTER — Encounter: Payer: Self-pay | Admitting: Family Medicine

## 2017-12-14 ENCOUNTER — Ambulatory Visit (INDEPENDENT_AMBULATORY_CARE_PROVIDER_SITE_OTHER): Payer: Medicare Other | Admitting: Family Medicine

## 2017-12-14 VITALS — BP 140/84 | HR 67 | Temp 98.1°F | Resp 16 | Wt 214.0 lb

## 2017-12-14 DIAGNOSIS — I10 Essential (primary) hypertension: Secondary | ICD-10-CM

## 2017-12-14 DIAGNOSIS — E7800 Pure hypercholesterolemia, unspecified: Secondary | ICD-10-CM

## 2017-12-14 NOTE — Progress Notes (Signed)
Patient: Carrie Wheeler Female    DOB: [birth year]   77 y.o.   MRN: [id number] Visit Date: 12/14/2017  Today's Provider: [name], MD   I, Martha Clan, CMA, am acting as scribe for [name], MD. [phone]  Chief Complaint  Patient presents with  . Hyperlipidemia   Subjective:    HPI      Lipid/Cholesterol, Follow-up:   Last seen for this 3 months ago.  Provider wanted to start atorvastatin 40 mg, but declined stating she would rather work on lifestyle modifications. ASCVD 10 yr risk was 20.4% at that time. . Last Lipid Panel:    Component Value Date/Time   CHOL 214 (H) 09/12/2017 1053   TRIG 77 09/12/2017 1053   HDL 58 09/12/2017 1053   CHOLHDL 3.7 09/12/2017 1053   LDLCALC 141 (H) 09/12/2017 1053    Risk factors for vascular disease include hypercholesterolemia and hypertension  She reports fair compliance with treatment. Current diet: imoroved per pt, but she states she does not follow a strict diet every day - has increased fish intake Current exercise: is now going to water aerobic classes three times per week  Wt Readings from Last 3 Encounters:  12/14/17 214 lb (97.1 kg)  09/12/17 216 lb (98 kg)  08/30/16 216 lb (98 kg)   Also taking Omega 3s daily and ASA 81mg  daily Does not want to take statin, because she is concerned about possible side effects, especially myalgias/arthralgias as she already experiences this ------------------------------------------------------------------- HTN: - Medications: amlodipine 5mg  daily - Compliance: good - has not taken todays dose yet as she is fasting, though - Checking BP at home: no - Denies any SOB, CP, vision changes, LE edema, medication SEs, or symptoms of hypotension - Diet: see above - Exercise: see above   No Known Allergies   Current Outpatient Medications:  .  amLODipine (NORVASC) 5 MG tablet, TAKE 1 TABLET BY MOUTH EVERY DAY, Disp: 90 tablet, Rfl: 1 .  aspirin (ASPIRIN CHILDRENS)  81 MG chewable tablet, Chew 1 tablet (81 mg total) by mouth daily., Disp: 1 tablet, Rfl: 0 .  Bioflavonoid Products (VITAMIN C) CHEW, Chew by mouth. Reported on 11/27/2015, Disp: , Rfl:  .  calcium carbonate (OS-CAL) 600 MG TABS tablet, Take 1 tablet by mouth daily., Disp: , Rfl:  .  cetirizine (ZYRTEC) 10 MG tablet, Take 1 tablet by mouth daily., Disp: , Rfl:  .  Cholecalciferol (VITAMIN D) 2000 UNITS CAPS, Take by mouth., Disp: , Rfl:  .  fluticasone (FLONASE) 50 MCG/ACT nasal spray, INSTILL 2 SPRAYS INTO EACH NOSTRIL EVERY DAY, Disp: 48 g, Rfl: 1 .  Lactobacillus (PROBIOTIC ACIDOPHILUS PO), Take 1 tablet by mouth daily., Disp: , Rfl:  .  montelukast (SINGULAIR) 10 MG tablet, TAKE 1 TABLET BY MOUTH AT BEDTIME, Disp: 90 tablet, Rfl: 2 .  Multiple Vitamins-Minerals (OCUVITE ADULT 50+ PO), Take by mouth., Disp: , Rfl:  .  OMEGA-3 FATTY ACIDS PO, Taking 1300 mg capsules- 3 capsules daily, Disp: , Rfl:  .  Psyllium 48.57 % POWD, METAMUCIL, 48.57% (Oral Powder)  QHS, As Directed for 0 days  Quantity: 0.00;  Refills: 0   Ordered :24-Dec-2010  Doy Hutching ;  Started 10-Apr-2010 Active, Disp: , Rfl:  .  triamcinolone cream (KENALOG) 0.1 %, Apply 1 application topically 2 (two) times daily. (Patient not taking: Reported on 12/14/2017), Disp: 80 g, Rfl: 1  Review of Systems  Constitutional: Negative for activity change, appetite change, chills, diaphoresis,  fatigue, fever and unexpected weight change.  Respiratory: Negative for cough, choking and shortness of breath.   Cardiovascular: Negative for chest pain, palpitations and leg swelling.  Gastrointestinal: Negative.   Musculoskeletal: Positive for arthralgias. Negative for gait problem.  Skin: Negative.   Neurological: Negative.     Social History   Tobacco Use  . Smoking status: Never Smoker  . Smokeless tobacco: Never Used  Substance Use Topics  . 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 She appears well-developed and well-nourished. No distress.  HENT:  Head: Normocephalic and atraumatic.  Eyes: Conjunctivae are normal. No scleral icterus.  Cardiovascular: Normal rate, regular rhythm, normal heart sounds and intact distal pulses.  No murmur heard. Pulmonary/Chest: Effort normal and breath sounds normal. No respiratory distress. She has no wheezes. She has no rales.  Musculoskeletal: She exhibits no edema or deformity.  Neurological: She is alert and oriented to person, place, and time.  Skin: Skin is warm and dry. No rash noted.  Psychiatric: She has a normal mood and affect. Her behavior is normal.        Assessment & Plan:      Problem List Items Addressed This Visit      Cardiovascular and Mediastinum   Essential (primary) hypertension    At goal today, though has not taken today's dose of amlodipine Continue amlodipine 5mg  daily F/u in 6 months        Other   Hyperlipidemia - Primary    Has greatly improved diet and exercise in hopes of not needing statin for fear of potential side effects Discussed last lipid panel and ASCVD risk in depth Discussed rates of possible side effects with statins Recheck lipid panel today and recalculate ASCVD If it is improving, will give it 6 more months before rechecking again If not improving, patient will reconsider statin      Relevant Orders   Lipid Profile   Morbid obesity ([place])    Discussed diet and exercise Encouraged patient in her changes that she has made and losing 2 lbs during the  holidays         Return in about 6 months (around 06/13/2018).     The entirety of the information documented in the History of Present Illness, Review of Systems and Physical Exam were personally obtained by me. Portions of this information were initially documented by Raquel Sarna Ratchford, CMA and reviewed by me for thoroughness and accuracy.    Virginia Crews, MD, MPH [facility] 12/14/2017 8:40 AM

## 2017-12-14 NOTE — Assessment & Plan Note (Signed)
Discussed diet and exercise Encouraged patient in her changes that she has made and losing 2 lbs during the holidays

## 2017-12-14 NOTE — Assessment & Plan Note (Signed)
Has greatly improved diet and exercise in hopes of not needing statin for fear of potential side effects Discussed last lipid panel and ASCVD risk in depth Discussed rates of possible side effects with statins Recheck lipid panel today and recalculate ASCVD If it is improving, will give it 6 more months before rechecking again If not improving, patient will reconsider statin

## 2017-12-14 NOTE — Patient Instructions (Addendum)
Total cholesterol 214 (goal <200) HDL (good cholesterol) 58 (goal >39) LDL (bad cholesterol) 141  (goal <100)   Cholesterol Cholesterol is a white, waxy, fat-like substance that is needed by the human body in small amounts. The liver makes all the cholesterol we need. Cholesterol is carried from the liver by the blood through the blood vessels. Deposits of cholesterol (plaques) may build up on blood vessel (artery) walls. Plaques make the arteries narrower and stiffer. Cholesterol plaques increase the risk for heart attack and stroke. You cannot feel your cholesterol level even if it is very high. The only way to know that it is high is to have a blood test. Once you know your cholesterol levels, you should keep a record of the test results. Work with your health care provider to keep your levels in the desired range. What do the results mean?  Total cholesterol is a rough measure of all the cholesterol in your blood.  LDL (low-density lipoprotein) is the "bad" cholesterol. This is the type that causes plaque to build up on the artery walls. You want this level to be low.  HDL (high-density lipoprotein) is the "good" cholesterol because it cleans the arteries and carries the LDL away. You want this level to be high.  Triglycerides are fat that the body can either burn for energy or store. High levels are closely linked to heart disease. What are the desired levels of cholesterol?  Total cholesterol below 200.  LDL below 100 for people who are at risk, below 70 for people at very high risk.  HDL above 40 is good. A level of 60 or higher is considered to be protective against heart disease.  Triglycerides below 150. How can I lower my cholesterol? Diet Follow your diet program as told by your health care provider.  Choose fish or white meat chicken and [place], roasted or baked. Limit fatty cuts of red meat, fried foods, and processed meats, such as sausage and lunch meats.  Eat lots of  fresh fruits and vegetables.  Choose whole grains, beans, pasta, potatoes, and cereals.  Choose olive oil, corn oil, or canola oil, and use only small amounts.  Avoid butter, mayonnaise, shortening, or palm kernel oils.  Avoid foods with trans fats.  Drink skim or nonfat milk and eat low-fat or nonfat yogurt and cheeses. Avoid whole milk, cream, ice cream, egg yolks, and full-fat cheeses.  Healthier desserts include angel food cake, ginger snaps, animal crackers, hard candy, popsicles, and low-fat or nonfat frozen yogurt. Avoid pastries, cakes, pies, and cookies.  Exercise  Follow your exercise program as told by your health care provider. A regular program: ? Helps to decrease LDL and raise HDL. ? Helps with weight control.  Do things that increase your activity level, such as gardening, walking, and taking the stairs.  Ask your health care provider about ways that you can be more active in your daily life.  Medicine  Take over-the-counter and prescription medicines only as told by your health care provider. ? Medicine may be prescribed by your health care provider to help lower cholesterol and decrease the risk for heart disease. This is usually done if diet and exercise have failed to bring down cholesterol levels. ? If you have several risk factors, you may need medicine even if your levels are normal.  This information is not intended to replace advice given to you by your health care provider. Make sure you discuss any questions you have with your health care  provider. Document Released: 08/17/2001 Document Revised: 06/19/2016 Document Reviewed: 05/22/2016 Elsevier Interactive Patient Education  [employer].

## 2017-12-14 NOTE — Assessment & Plan Note (Signed)
At goal today, though has not taken today's dose of amlodipine Continue amlodipine 5mg  daily F/u in 6 months

## 2017-12-15 ENCOUNTER — Telehealth: Payer: Self-pay

## 2017-12-15 LAB — LIPID PANEL
CHOL/HDL RATIO: 3.5 ratio (ref 0.0–4.4)
CHOLESTEROL TOTAL: 195 mg/dL (ref 100–199)
HDL: 55 mg/dL (ref >39)
LDL CALC: 126 mg/dL — AB (ref 0–99)
TRIGLYCERIDES: 72 mg/dL (ref 0–149)
VLDL Cholesterol Cal: 14 mg/dL (ref 5–40)

## 2017-12-15 NOTE — Telephone Encounter (Signed)
[email or web address]

## 2017-12-15 NOTE — Telephone Encounter (Signed)
-----   Message from Virginia Crews, MD sent at 12/15/2017  8:12 AM EST ----- LDL decreased from 141 to 126.  Total cholesterol down from 214 to 195.  HDL basically stable (58 to 55).  Keep up the good work with diet and exercise.  We will check again at next visit  Brita Romp, Dionne Bucy, MD, MPH [facility] 12/15/2017 8:12 AM

## 2017-12-15 NOTE — Telephone Encounter (Signed)
error 

## 2017-12-16 NOTE — Telephone Encounter (Signed)
Pt advised-Carrie Wheeler, RMA  

## 2018-02-05 ENCOUNTER — Other Ambulatory Visit: Payer: Self-pay | Admitting: Physician Assistant

## 2018-02-05 DIAGNOSIS — J302 Other seasonal allergic rhinitis: Secondary | ICD-10-CM

## 2018-03-13 ENCOUNTER — Ambulatory Visit: Payer: Self-pay | Admitting: Family Medicine

## 2018-06-12 ENCOUNTER — Ambulatory Visit: Payer: Self-pay | Admitting: Family Medicine

## 2018-06-12 NOTE — Progress Notes (Deleted)
Patient: Carrie Wheeler Female    DOB: 04/14/1941   77 y.o.   MRN: [id number] Visit Date: 06/12/2018  Today's Provider: [name], MD   I, Martha Clan, CMA, am acting as scribe for [name], MD.  No chief complaint on file.  Subjective:    HPI      Hypertension, follow-up:  BP Readings from Last 3 Encounters:  12/14/17 140/84  09/12/17 140/68  08/30/16 128/80    She was last seen for hypertension 6 months ago.  BP at that visit was 140/84. Management since that visit includes continuing amlodipine 5 mg. She reports {excellent/good/fair/poor:19665} compliance with treatment. She {ACTION; IS/IS DXA:12878676} having side effects. *** She {is/is not:9024} exercising. She {is/is not:9024} adherent to low salt diet.   Outside blood pressures are ***. She is experiencing {Symptoms; cardiac:12860}.  Patient denies {Symptoms; cardiac:12860}.   Cardiovascular risk factors include advanced age (older than 29 for men, 21 for women), dyslipidemia, hypertension and obesity (BMI >= 30 kg/m2).  Use of agents associated with hypertension: {bp agents assoc with hypertension:511::"none"}.     Weight trend: {trend:16658} Wt Readings from Last 3 Encounters:  12/14/17 214 lb (97.1 kg)  09/12/17 216 lb (98 kg)  08/30/16 216 lb (98 kg)    Current diet: {diet habits:16563}  ------------------------------------------------------------------------  Lipid/Cholesterol, Follow-up:   Last seen for this 6 months ago.  Management changes since that visit include checking labs, which were stable. Pt was advised to continue lifestyle changes, and will recheck labs in 6 months. . Last Lipid Panel:    Component Value Date/Time   CHOL 195 12/14/2017 0843   TRIG 72 12/14/2017 0843   HDL 55 12/14/2017 0843   CHOLHDL 3.5 12/14/2017 0843   LDLCALC 126 (H) 12/14/2017 0843    Risk factors for vascular disease include hypertension and obesity  She reports  {excellent/good/fair/poor:19665} compliance with treatment. She {ACTION; IS/IS [facility]:[id number]} having side effects.  Current symptoms include {Symptoms; diabetes:14075} and have been {Desc; course:15616}. Weight trend: {trend:16658} Prior visit with dietician: {yes/no:17258} Current diet: {diet habits:16563} Current exercise: {exercise types:16438}  Wt Readings from Last 3 Encounters:  12/14/17 214 lb (97.1 kg)  09/12/17 216 lb (98 kg)  08/30/16 216 lb (98 kg)    -------------------------------------------------------------------   No Known Allergies   Current Outpatient Medications:  .  amLODipine (NORVASC) 5 MG tablet, TAKE 1 TABLET BY MOUTH EVERY DAY, Disp: 90 tablet, Rfl: 1 .  aspirin (ASPIRIN CHILDRENS) 81 MG chewable tablet, Chew 1 tablet (81 mg total) by mouth daily., Disp: 1 tablet, Rfl: 0 .  Bioflavonoid Products (VITAMIN C) CHEW, Chew by mouth. Reported on 11/27/2015, Disp: , Rfl:  .  calcium carbonate (OS-CAL) 600 MG TABS tablet, Take 1 tablet by mouth daily., Disp: , Rfl:  .  cetirizine (ZYRTEC) 10 MG tablet, Take 1 tablet by mouth daily., Disp: , Rfl:  .  Cholecalciferol (VITAMIN D) 2000 UNITS CAPS, Take by mouth., Disp: , Rfl:  .  fluticasone (FLONASE) 50 MCG/ACT nasal spray, SHAKE LIQUID AND USE 2 SPRAYS IN EACH NOSTRIL EVERY DAY, Disp: 16 g, Rfl: 11 .  Lactobacillus (PROBIOTIC ACIDOPHILUS PO), Take 1 tablet by mouth daily., Disp: , Rfl:  .  montelukast (SINGULAIR) 10 MG tablet, TAKE 1 TABLET BY MOUTH AT BEDTIME, Disp: 90 tablet, Rfl: 2 .  Multiple Vitamins-Minerals (OCUVITE ADULT 50+ PO), Take by mouth., Disp: , Rfl:  .  OMEGA-3 FATTY ACIDS PO, Taking 1300 mg capsules- 3 capsules daily, Disp: ,  Rfl:  .  Psyllium 48.57 % POWD, METAMUCIL, 48.57% (Oral Powder)  QHS, As Directed for 0 days  Quantity: 0.00;  Refills: 0   Ordered :24-Dec-2010  Doy Hutching ;  Started 10-Apr-2010 Active, Disp: , Rfl:  .  triamcinolone cream (KENALOG) 0.1 %, Apply 1 application topically 2  (two) times daily. (Patient not taking: Reported on 12/14/2017), Disp: 80 g, Rfl: 1  Review of Systems  Social History   Tobacco Use  . Smoking status: Never Smoker  . Smokeless tobacco: Never Used  Substance Use Topics  . 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 There were no vitals filed for this visit.   Physical Exam      Assessment & Plan:

## 2018-06-23 ENCOUNTER — Encounter: Payer: Self-pay | Admitting: Family Medicine

## 2018-06-23 ENCOUNTER — Ambulatory Visit (INDEPENDENT_AMBULATORY_CARE_PROVIDER_SITE_OTHER): Payer: Medicare Other | Admitting: Family Medicine

## 2018-06-23 VITALS — BP 156/78 | HR 71 | Temp 98.3°F | Resp 16 | Wt 211.0 lb

## 2018-06-23 DIAGNOSIS — I10 Essential (primary) hypertension: Secondary | ICD-10-CM

## 2018-06-23 DIAGNOSIS — E7800 Pure hypercholesterolemia, unspecified: Secondary | ICD-10-CM | POA: Diagnosis not present

## 2018-06-23 DIAGNOSIS — H538 Other visual disturbances: Secondary | ICD-10-CM

## 2018-06-23 MED ORDER — AMLODIPINE BESYLATE 10 MG PO TABS: 10.0000 mg | ORAL_TABLET | Freq: Every day | ORAL | 5 refills | Status: DC

## 2018-06-23 NOTE — Progress Notes (Signed)
Patient: Carrie Wheeler Female    DOB: 12-Sep-1941   77 y.o.   MRN: [id number] Visit Date: 06/23/2018  Today's Provider: [name], MD   I, Martha Clan, CMA, am acting as scribe for [name], MD.  Chief Complaint  Patient presents with  . Hypertension  . Hyperlipidemia   Subjective:    HPI      Hypertension, follow-up:  BP Readings from Last 3 Encounters:  06/23/18 (!) 156/78  12/14/17 140/84  09/12/17 140/68    She was last seen for hypertension 6 months ago.  BP at that visit was 140/84. Management since that visit includes continuing amlodipine 5 mg. She reports good compliance with treatment. She has not taken her medication this morning. She is not having side effects.  She is exercising. Tries to gp to pool for exercising, but states she has an OAB that prohibits her from swimming too long. She is also performing yard work. She is adherent to low salt diet.   Outside blood pressures are not being checked.. She is experiencing none.  Patient denies chest pain, chest pressure/discomfort, claudication, dyspnea, exertional chest pressure/discomfort, fatigue, irregular heart beat, lower extremity edema, near-syncope, orthopnea, palpitations and syncope.   Cardiovascular risk factors include advanced age (older than 95 for men, 85 for women), dyslipidemia, hypertension and obesity (BMI >= 30 kg/m2).  Use of agents associated with hypertension: none.     Weight trend: decreasing steadily Wt Readings from Last 3 Encounters:  06/23/18 211 lb (95.7 kg)  12/14/17 214 lb (97.1 kg)  09/12/17 216 lb (98 kg)    Current diet: well balanced  She does report intermittent b/l blurred vision.  She states this happens with looking at TV or computer or other screens.  It looks like "the TV when there is a storm."  She does not get regular eye exams. ------------------------------------------------------------------------  Lipid/Cholesterol,  Follow-up:   Last seen for this 6 months ago.  Management changes since that visit include encouraging pt to continue lifestyle changes that have helped improve lipids. Pt has been hesitant to try statins. She has decided to pursue diet and exercise to lower her ASCVD risk. .  Last Lipid Panel:    Component Value Date/Time   CHOL 195 12/14/2017 0843   TRIG 72 12/14/2017 0843   HDL 55 12/14/2017 0843   CHOLHDL 3.5 12/14/2017 0843   LDLCALC 126 (H) 12/14/2017 0843    Risk factors for vascular disease include hypercholesterolemia and hypertension  She reports good compliance with treatment.  -------------------------------------------------------------------   No Known Allergies   Current Outpatient Medications:  .  amLODipine (NORVASC) 5 MG tablet, TAKE 1 TABLET BY MOUTH EVERY DAY, Disp: 90 tablet, Rfl: 1 .  aspirin (ASPIRIN CHILDRENS) 81 MG chewable tablet, Chew 1 tablet (81 mg total) by mouth daily., Disp: 1 tablet, Rfl: 0 .  Bioflavonoid Products (VITAMIN C) CHEW, Chew by mouth. Reported on 11/27/2015, Disp: , Rfl:  .  calcium carbonate (OS-CAL) 600 MG TABS tablet, Take 1 tablet by mouth daily., Disp: , Rfl:  .  cetirizine (ZYRTEC) 10 MG tablet, Take 1 tablet by mouth daily., Disp: , Rfl:  .  Cholecalciferol (VITAMIN D) 2000 UNITS CAPS, Take by mouth., Disp: , Rfl:  .  fluticasone (FLONASE) 50 MCG/ACT nasal spray, SHAKE LIQUID AND USE 2 SPRAYS IN EACH NOSTRIL EVERY DAY, Disp: 16 g, Rfl: 11 .  Lactobacillus (PROBIOTIC ACIDOPHILUS PO), Take 1 tablet by mouth daily., Disp: , Rfl:  .  montelukast (SINGULAIR) 10 MG tablet, TAKE 1 TABLET BY MOUTH AT BEDTIME, Disp: 90 tablet, Rfl: 2 .  Multiple Vitamins-Minerals (OCUVITE ADULT 50+ PO), Take by mouth., Disp: , Rfl:  .  OMEGA-3 FATTY ACIDS PO, Taking 1300 mg capsules- 3 capsules daily, Disp: , Rfl:   Review of Systems  Constitutional: Negative for activity change, appetite change, chills, diaphoresis, fatigue, fever and unexpected  weight change.  Eyes: Positive for visual disturbance.  Respiratory: Negative for shortness of breath.   Cardiovascular: Negative for chest pain, palpitations and leg swelling.    Social History   Tobacco Use  . Smoking status: Never Smoker  . Smokeless tobacco: Never Used  Substance Use Topics  . 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 She appears well-developed and well-nourished. No distress.  HENT:  Head: Normocephalic and atraumatic.  Eyes: Pupils are equal, round, and reactive to light. Conjunctivae and EOM are normal. Right eye exhibits no discharge. Left eye exhibits no discharge. No scleral icterus.  Neck: Neck supple. No thyromegaly present.  Cardiovascular: Normal rate, regular rhythm, normal heart sounds and intact distal pulses.  No murmur heard. Pulmonary/Chest: Effort normal and breath sounds normal. No respiratory distress. She has no wheezes. She has no rales.  Musculoskeletal: She exhibits edema (trace b/l). She exhibits no deformity.  Neurological: She is alert and oriented to person, place, and time.  Skin: Skin is warm and dry. Capillary refill takes less than 2 seconds. No rash noted.  Psychiatric: She has a normal mood and affect. Her behavior is normal.  Vitals reviewed.       Assessment & Plan:   Problem List Items Addressed This Visit      Cardiovascular and Mediastinum   Essential (primary) hypertension - Primary    Uncontrolled and borderline at last visit Even though has not  taken her AM dose of amlodipine yet this AM, suspect that she needs a slightly higher dose Increase amlodipine to 10mg  daily F/u in 3 months Watch home BPs      Relevant Medications   amLODipine (NORVASC) 10 MG tablet   Other Relevant Orders   Comprehensive metabolic panel     Other   Hyperlipidemia    Patient s/p 6 months of lifestyle interventions Hesitant to take a statin Recheck FLP and CMP Reevaluate ASCVD risk and consider statin if indicated      Relevant Medications   amLODipine (NORVASC) 10 MG tablet   Other Relevant Orders   Lipid panel   Comprehensive metabolic panel   Blurry vision    New problem Referral to Ophthalmology for further evaluation      Relevant Orders   Ambulatory referral to Ophthalmology       Return in about 3 months (around 09/23/2018) for CPE (already scheduled).   The entirety of the information documented in the History of Present Illness,  Review of Systems and Physical Exam were personally obtained by me. Portions of this information were initially documented by Raquel Sarna Ratchford, CMA and reviewed by me for thoroughness and accuracy.    Virginia Crews, MD, MPH [facility] 06/23/2018 8:31 AM

## 2018-06-23 NOTE — Patient Instructions (Addendum)
Goal blood pressure <140/90 - can check every few days Bring log to next visit  Increase amlodipine to 10mg  daily (2 of the 5mg  pills together until you run out), then the 10mg  pills will be the next refill  We will call about blood work and possible cholesterol medicine  Someone will call about eye appointment

## 2018-06-23 NOTE — Assessment & Plan Note (Signed)
Uncontrolled and borderline at last visit Even though has not taken her AM dose of amlodipine yet this AM, suspect that she needs a slightly higher dose Increase amlodipine to 10mg  daily F/u in 3 months Watch home BPs

## 2018-06-23 NOTE — Assessment & Plan Note (Signed)
New problem Referral to Ophthalmology for further evaluation

## 2018-06-23 NOTE — Assessment & Plan Note (Signed)
Patient s/p 6 months of lifestyle interventions Hesitant to take a statin Recheck FLP and CMP Reevaluate ASCVD risk and consider statin if indicated

## 2018-06-24 LAB — COMPREHENSIVE METABOLIC PANEL
ALBUMIN: 3.9 g/dL (ref 3.5–4.8)
ALT: 11 IU/L (ref 0–32)
AST: 16 IU/L (ref 0–40)
Albumin/Globulin Ratio: 1.6 (ref 1.2–2.2)
Alkaline Phosphatase: 97 IU/L (ref 39–117)
BILIRUBIN TOTAL: 0.5 mg/dL (ref 0.0–1.2)
BUN / CREAT RATIO: 18 (ref 12–28)
BUN: 14 mg/dL (ref 8–27)
CO2: 24 mmol/L (ref 20–29)
Calcium: 9.7 mg/dL (ref 8.7–10.3)
Chloride: 103 mmol/L (ref 96–106)
Creatinine, Ser: 0.78 mg/dL (ref 0.57–1.00)
GFR calc non Af Amer: 75 mL/min/{1.73_m2} (ref >59)
GFR, EST AFRICAN AMERICAN: 87 mL/min/{1.73_m2} (ref >59)
GLOBULIN, TOTAL: 2.5 g/dL (ref 1.5–4.5)
GLUCOSE: 92 mg/dL (ref 65–99)
Potassium: 4.5 mmol/L (ref 3.5–5.2)
SODIUM: 143 mmol/L (ref 134–144)
TOTAL PROTEIN: 6.4 g/dL (ref 6.0–8.5)

## 2018-06-24 LAB — LIPID PANEL
CHOLESTEROL TOTAL: 204 mg/dL — AB (ref 100–199)
Chol/HDL Ratio: 4 ratio (ref 0.0–4.4)
HDL: 51 mg/dL (ref >39)
LDL Calculated: 133 mg/dL — ABNORMAL HIGH (ref 0–99)
TRIGLYCERIDES: 99 mg/dL (ref 0–149)
VLDL Cholesterol Cal: 20 mg/dL (ref 5–40)

## 2018-06-26 ENCOUNTER — Telehealth: Payer: Self-pay

## 2018-06-26 NOTE — Telephone Encounter (Signed)
[email or web address]

## 2018-06-26 NOTE — Telephone Encounter (Signed)
-----   Message from Virginia Crews, MD sent at 06/26/2018  4:13 PM EDT ----- Cholesterol has not gone down from when checked 6 months ago.  It is actually slightly higher.  I am still recommending a statin to lower heart disease and stroke risk.  Normal kidney function, liver function, electrolytes.  Virginia Crews, MD, MPH [facility] 06/26/2018 4:13 PM

## 2018-06-29 NOTE — Telephone Encounter (Signed)
Patient states that she is at work now and is requesting that you call her back at [phone].

## 2018-06-29 NOTE — Telephone Encounter (Signed)
Pt advised.

## 2018-06-29 NOTE — Telephone Encounter (Signed)
Patient is returning your call and requesting a call back. °

## 2018-07-01 ENCOUNTER — Telehealth: Payer: Self-pay | Admitting: Family Medicine

## 2018-07-01 NOTE — Telephone Encounter (Signed)
Patient states that we were supposed to send in a prescription for cholesterol when we called to advise her of her labs.  [facility] [address].

## 2018-07-03 MED ORDER — ATORVASTATIN CALCIUM 40 MG PO TABS: 40.0000 mg | ORAL_TABLET | Freq: Every day | ORAL | 5 refills | Status: DC

## 2018-07-03 NOTE — Telephone Encounter (Signed)
Please review

## 2018-07-03 NOTE — Telephone Encounter (Signed)
Left message advising pt that is has been sent to pharmacy.

## 2018-07-03 NOTE — Telephone Encounter (Signed)
Rx sent to pharmacy.  Medication name is atorvastatin.  Also let her know that we will plan to recheck cholesterol and liver function in about 3-6 months.  Virginia Crews, MD, MPH [facility] 07/03/2018 8:26 AM

## 2018-08-02 ENCOUNTER — Telehealth: Payer: Self-pay

## 2018-08-02 NOTE — Telephone Encounter (Signed)
LMTCB. Pt has an AWV scheduled 09/13/18 and I will be out of the office that day. Need to r/s apt and possible r/s CPE if pt wants apts back to back. -MM

## 2018-08-03 NOTE — Telephone Encounter (Signed)
Spoke with pt and she stated she wanted to move both apts to early morning so she can get to work on time. Pt scheduled AWV for 09/18/18 @ 8:$0 AM and CPE for 10/05/18 @ 9 AM. -MM

## 2018-09-13 ENCOUNTER — Ambulatory Visit: Payer: Self-pay

## 2018-09-13 ENCOUNTER — Encounter: Payer: Self-pay | Admitting: Family Medicine

## 2018-09-18 ENCOUNTER — Ambulatory Visit (INDEPENDENT_AMBULATORY_CARE_PROVIDER_SITE_OTHER): Payer: Medicare Other

## 2018-09-18 VITALS — BP 118/66 | HR 84 | Temp 98.0°F | Ht 60.0 in | Wt 213.2 lb

## 2018-09-18 DIAGNOSIS — Z0000 Encounter for general adult medical examination without abnormal findings: Secondary | ICD-10-CM | POA: Diagnosis not present

## 2018-09-18 DIAGNOSIS — Z23 Encounter for immunization: Secondary | ICD-10-CM | POA: Diagnosis not present

## 2018-09-18 NOTE — Progress Notes (Signed)
Subjective:   Carrie Wheeler is a 77 y.o. female who presents for Medicare Annual (Subsequent) preventive examination.  Review of Systems:  N/A  Cardiac Risk Factors include: advanced age (>75men, >73 women);dyslipidemia;hypertension;obesity (BMI >30kg/m2)     Objective:     Vitals: BP 118/66 (BP Location: Right Arm)   Pulse 84   Temp 98 F (36.7 C) (Oral)   Ht 5' (1.524 m)   Wt 213 lb 3.2 oz (96.7 kg)   BMI 41.64 kg/m   Body mass index is 41.64 kg/m.  Advanced Directives 09/18/2018 09/12/2017 11/27/2015 11/17/2015 09/19/2015  Does Patient Have a Medical Advance Directive? Yes Yes Yes No Yes  Type of Paramedic of [place];Living will [place];Living will [place];Living will - Living will;Healthcare Power of Attorney  Does patient want to make changes to medical advance directive? - - No - Patient declined - -  Copy of [place] in Chart? No - copy requested - No - copy requested - -  Would patient like information on creating a medical advance directive? - - - Yes - Scientist, clinical (histocompatibility and immunogenetics) given -    Tobacco Social History   Tobacco Use  Smoking Status Never Smoker  Smokeless Tobacco Never Used     Counseling given: Not Answered   Clinical Intake:  Pre-visit preparation completed: Yes  Pain : No/denies pain Pain Score: 0-No pain     Nutritional Status: BMI > 30  Obese Nutritional Risks: None Diabetes: No  How often do you need to have someone help you when you read instructions, pamphlets, or other written materials from your doctor or pharmacy?: 1 - Never  Interpreter Needed?: No  Information entered by :: [facility], LPN  Past Medical History:  Diagnosis Date  . Environmental allergies   . GERD (gastroesophageal reflux disease)    occasional  . Hyperlipidemia   . Hypertension    Past Surgical History:  Procedure Laterality Date  . COLONOSCOPY    . COLONOSCOPY  WITH PROPOFOL N/A 11/27/2015   Procedure: COLONOSCOPY WITH PROPOFOL;  Surgeon: Lucilla Lame, MD;  Location: [facility];  Service: Endoscopy;  Laterality: N/A;  . TONSILLECTOMY     Family History  Problem Relation Age of Onset  . Hypertension Father   . Colon cancer Father   . Alzheimer's disease Other   . Diabetes Other   . Colon cancer Other   . Liver disease Son   . Diabetes Mother   . Heart Problems Brother        Congential heart defect  . Healthy Brother    Social History   Socioeconomic History  . Marital status: Single    Spouse name: Not on file  . Number of children: 0  . Years of education: HS  . Highest education level: High school graduate  Occupational History    Employer: [employer]    Comment: part-time  Social Needs  . Financial resource strain: Not hard at all  . Food insecurity:    Worry: Never true    Inability: Never true  . Transportation needs:    Medical: No    Non-medical: No  Tobacco Use  . Smoking status: Never Smoker  . Smokeless tobacco: Never Used  Substance and Sexual Activity  . 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 Drug use: No  . Sexual activity: Not Currently  Lifestyle  .  Physical activity:    Days per week: Not on file    Minutes per session: Not on file  . Stress: Not at all  Relationships  . Social connections:    Talks on phone: Patient refused    Gets together: Patient refused    Attends religious service: Patient refused    Active member of club or organization: Patient refused    Attends meetings of clubs or organizations: Patient refused    Relationship status: Patient refused  Other Topics Concern  . Not on file  Social History Narrative   Had one child; a son who is now deceased due to liver disease.    Outpatient Encounter Medications as of 09/18/2018  Medication Sig  . amLODipine (NORVASC) 10 MG tablet Take 1 tablet (10 mg total) by mouth daily.  Marland Kitchen  aspirin (ASPIRIN CHILDRENS) 81 MG chewable tablet Chew 1 tablet (81 mg total) by mouth daily.  Marland Kitchen atorvastatin (LIPITOR) 40 MG tablet Take 1 tablet (40 mg total) by mouth daily.  Marland Kitchen Bioflavonoid Products (VITAMIN C) CHEW Chew by mouth. Reported on 11/27/2015  . calcium carbonate (OS-CAL) 600 MG TABS tablet Take 1 tablet by mouth daily.  . cetirizine (ZYRTEC) 10 MG tablet Take 1 tablet by mouth daily.  . Cholecalciferol (VITAMIN D) 2000 UNITS CAPS Take by mouth daily.   . fluticasone (FLONASE) 50 MCG/ACT nasal spray SHAKE LIQUID AND USE 2 SPRAYS IN EACH NOSTRIL EVERY DAY  . Lactobacillus (PROBIOTIC ACIDOPHILUS PO) Take 1 tablet by mouth daily.  . montelukast (SINGULAIR) 10 MG tablet TAKE 1 TABLET BY MOUTH AT BEDTIME  . Multiple Vitamins-Minerals (OCUVITE ADULT 50+ PO) Take by mouth daily.   . OMEGA-3 FATTY ACIDS PO Taking 1300 mg capsules- 3 capsules daily   No facility-administered encounter medications on file as of 09/18/2018.     Activities of Daily Living In your present state of health, do you have any difficulty performing the following activities: 09/18/2018  Hearing? N  Vision? N  Difficulty concentrating or making decisions? N  Walking or climbing stairs? Y  Comment Due to leg and lower back pain.   Dressing or bathing? N  Doing errands, shopping? N  Preparing Food and eating ? N  Using the Toilet? N  In the past six months, have you accidently leaked urine? Y  Comment Wears protection daily.   Do you have problems with loss of bowel control? N  Managing your Medications? N  Managing your Finances? N  Housekeeping or managing your Housekeeping? N  Some recent data might be hidden    Patient Care Team: Virginia Crews, MD as PCP - General (Family Medicine)    Assessment:   This is a routine wellness examination for Carrie Wheeler.  Exercise Activities and Dietary recommendations Current Exercise Habits: The patient does not participate in regular exercise at present,  Exercise limited by: None identified  Goals    . Exercise 3x per week (30 min per time)     Recommend to start some form of exercise 3 times a week for at least 30 minutes.          Fall Risk Fall Risk  09/18/2018 12/14/2017 09/12/2017 08/30/2016 05/19/2015  Falls in the past year? No No No No No   FALL RISK PREVENTION PERTAINING TO THE HOME:  Any stairs in or around the home WITH handrails? No  Home free of loose throw rugs in walkways, pet beds, electrical cords, etc? Yes  Adequate lighting in your home to  reduce risk of falls? Yes   ASSISTIVE DEVICES UTILIZED TO PREVENT FALLS:  Life alert? Yes  Use of a cane, walker or w/c? No  Grab bars in the bathroom? Yes  Shower chair or bench in shower? Yes  Elevated toilet seat or a handicapped toilet? No   DME ORDERS:  DME order needed?  No   TIMED UP AND GO:  Was the test performed? No .   Depression Screen PHQ 2/9 Scores 09/18/2018 12/14/2017 09/12/2017 08/30/2016  PHQ - 2 Score 0 0 0 0     Cognitive Function: Declined today.        Immunization History  Administered Date(s) Administered  . Influenza, High Dose Seasonal PF 09/12/2017, 09/18/2018  . Pneumococcal Conjugate-13 05/19/2015  . Pneumococcal Polysaccharide-23 04/02/2011  . Td 06/01/2006, 08/30/2016  . Tdap 06/01/2006    Qualifies for Shingles Vaccine? Yes . Due for Shingrix. Education has been provided regarding the importance of this vaccine. Pt has been advised to call insurance company to determine out of pocket expense. Advised may also receive vaccine at local pharmacy or Health Dept. Verbalized acceptance and understanding.  Tdap: Up to dateh Dept. Verbalized acceptance and understanding.  Flu Vaccine: Due for Flu vaccine. Does the patient want to receive this vaccine today?  Yes .   Pneumococcal Vaccine: Up to date  Screening Tests Health Maintenance  Topic Date Due  . MAMMOGRAM  09/14/2019  . COLONOSCOPY  11/26/2020  . TETANUS/TDAP  08/30/2026    . INFLUENZA VACCINE  Completed  . DEXA SCAN  Completed  . PNA vac Low Risk Adult  Completed   Cancer Screenings:  Colorectal Screening: Completed 11/27/15. Repeat every 5 years.  Mammogram: Completed 09/13/17.   Bone Density: Completed 10/30/14.   Lung Cancer Screening: (Low Dose CT Chest recommended if Age 43-80 years, 30 pack-year currently smoking OR have quit w/in 15years.) does not qualify.   Additional Screening:  Hepatitis C Screening: does not qualify.  Vision Screening: Recommended annual ophthalmology exams for early detection of glaucoma and other disorders of the eye.  Dental Screening: Recommended annual dental exams for proper oral hygiene  Community Resource Referral:  CRR required this visit?  No      Plan:  I have personally reviewed and addressed the Medicare Annual Wellness questionnaire and have noted the following in the patient's chart:  A. Medical and social history B. Use of alcohol, tobacco or illicit drugs  C. Current medications and supplements D. Functional ability and status E.  Nutritional status F.  Physical activity G. Advance directives H. List of other physicians I.  Hospitalizations, surgeries, and ER visits in previous 12 months J.  [place] such as hearing and vision if needed, cognitive and depression L. Referrals and appointments - none  In addition, I have reviewed and discussed with patient certain preventive protocols, quality metrics, and best practice recommendations. A written personalized care plan for preventive services as well as general preventive health recommendations were provided to patient.  See attached scanned questionnaire for additional information.   Signed,  Fabio Neighbors, LPN Nurse Health Advisor   Nurse Recommendations: None.

## 2018-09-18 NOTE — Patient Instructions (Addendum)
Carrie Wheeler , Thank you for taking time to come for your Medicare Wellness Visit. I appreciate your ongoing commitment to your health goals. Please review the following plan we discussed and let me know if I can assist you in the future.   Screening recommendations/referrals: Colonoscopy: Up to date Mammogram: Up to date Bone Density: Up to date Recommended yearly ophthalmology/optometry visit for glaucoma screening and checkup Recommended yearly dental visit for hygiene and checkup  Vaccinations: Influenza vaccine: Up to date Pneumococcal vaccine: Up to date Tdap vaccine: Up to date Shingles vaccine: Pt declines today.     Advanced directives: Please bring a copy of your POA (Power of Attorney) and/or Living Will to your next appointment.   Conditions/risks identified: Obesity- recommend to start some form of exercise 3 times a week for at least 30 minutes.   Next appointment: 10/05/18 @ 9 AM with Dr Brita Romp.   Preventive Care 75 Years and Older, Female Preventive care refers to lifestyle choices and visits with your health care provider that can promote health and wellness. What does preventive care include?  A yearly physical exam. This is also called an annual well check.  Dental exams once or twice a year.  Routine eye exams. Ask your health care provider how often you should have your eyes checked.  Personal lifestyle choices, including:  Daily care of your teeth and gums.  Regular physical activity.  Eating a healthy diet.  Avoiding tobacco and drug use.  Limiting alcohol use.  Practicing safe sex.  Taking low-dose aspirin every day.  Taking vitamin and mineral supplements as recommended by your health care provider. What happens during an annual well check? The services and screenings done by your health care provider during your annual well check will depend on your age, overall health, lifestyle risk factors, and family history of disease. Counseling    Your health care provider may ask you questions about your:  Alcohol use.  Tobacco use.  Drug use.  Emotional well-being.  Home and relationship well-being.  Sexual activity.  Eating habits.  History of falls.  Memory and ability to understand (cognition).  Work and work Statistician.  Reproductive health. Screening  You may have the following tests or measurements:  Height, weight, and BMI.  Blood pressure.  Lipid and cholesterol levels. These may be checked every 5 years, or more frequently if you are over 75 years old.  Skin check.  Lung cancer screening. You may have this screening every year starting at age 75 if you have a 30-pack-year history of smoking and currently smoke or have quit within the past 15 years.  Fecal occult blood test (FOBT) of the stool. You may have this test every year starting at age 75.  Flexible sigmoidoscopy or colonoscopy. You may have a sigmoidoscopy every 5 years or a colonoscopy every 10 years starting at age 75.  Hepatitis C blood test.  Hepatitis B blood test.  Sexually transmitted disease (STD) testing.  Diabetes screening. This is done by checking your blood sugar (glucose) after you have not eaten for a while (fasting). You may have this done every 1-3 years.  Bone density scan. This is done to screen for osteoporosis. You may have this done starting at age 75.  Mammogram. This may be done every 1-2 years. Talk to your health care provider about how often you should have regular mammograms. Talk with your health care provider about your test results, treatment options, and if necessary, the need for more  tests. Vaccines  Your health care provider may recommend certain vaccines, such as:  Influenza vaccine. This is recommended every year.  Tetanus, diphtheria, and acellular pertussis (Tdap, Td) vaccine. You may need a Td booster every 10 years.  Zoster vaccine. You may need this after age 75.  Pneumococcal 13-valent  conjugate (PCV13) vaccine. One dose is recommended after age 75.  Pneumococcal polysaccharide (PPSV23) vaccine. One dose is recommended after age 75. Talk to your health care provider about which screenings and vaccines you need and how often you need them. This information is not intended to replace advice given to you by your health care provider. Make sure you discuss any questions you have with your health care provider. Document Released: 12/19/2015 Document Revised: 08/11/2016 Document Reviewed: 09/23/2015 Elsevier Interactive Patient Education  2017 [place] Prevention in the Home Falls can cause injuries. They can happen to people of all ages. There are many things you can do to make your home safe and to help prevent falls. What can I do on the outside of my home?  Regularly fix the edges of walkways and driveways and fix any cracks.  Remove anything that might make you trip as you walk through a door, such as a raised step or threshold.  Trim any bushes or trees on the path to your home.  Use bright outdoor lighting.  Clear any walking paths of anything that might make someone trip, such as rocks or tools.  Regularly check to see if handrails are loose or broken. Make sure that both sides of any steps have handrails.  Any raised decks and porches should have guardrails on the edges.  Have any leaves, snow, or ice cleared regularly.  Use sand or salt on walking paths during winter.  Clean up any spills in your garage right away. This includes oil or grease spills. What can I do in the bathroom?  Use night lights.  Install grab bars by the toilet and in the tub and shower. Do not use towel bars as grab bars.  Use non-skid mats or decals in the tub or shower.  If you need to sit down in the shower, use a plastic, non-slip stool.  Keep the floor dry. Clean up any water that spills on the floor as soon as it happens.  Remove soap buildup in the tub or  shower regularly.  Attach bath mats securely with double-sided non-slip rug tape.  Do not have throw rugs and other things on the floor that can make you trip. What can I do in the bedroom?  Use night lights.  Make sure that you have a light by your bed that is easy to reach.  Do not use any sheets or blankets that are too big for your bed. They should not hang down onto the floor.  Have a firm chair that has side arms. You can use this for support while you get dressed.  Do not have throw rugs and other things on the floor that can make you trip. What can I do in the kitchen?  Clean up any spills right away.  Avoid walking on wet floors.  Keep items that you use a lot in easy-to-reach places.  If you need to reach something above you, use a strong step stool that has a grab bar.  Keep electrical cords out of the way.  Do not use floor polish or wax that makes floors slippery. If you must use wax, use non-skid floor  wax.  Do not have throw rugs and other things on the floor that can make you trip. What can I do with my stairs?  Do not leave any items on the stairs.  Make sure that there are handrails on both sides of the stairs and use them. Fix handrails that are broken or loose. Make sure that handrails are as long as the stairways.  Check any carpeting to make sure that it is firmly attached to the stairs. Fix any carpet that is loose or worn.  Avoid having throw rugs at the top or bottom of the stairs. If you do have throw rugs, attach them to the floor with carpet tape.  Make sure that you have a light switch at the top of the stairs and the bottom of the stairs. If you do not have them, ask someone to add them for you. What else can I do to help prevent falls?  Wear shoes that:  Do not have high heels.  Have rubber bottoms.  Are comfortable and fit you well.  Are closed at the toe. Do not wear sandals.  If you use a stepladder:  Make sure that it is fully  opened. Do not climb a closed stepladder.  Make sure that both sides of the stepladder are locked into place.  Ask someone to hold it for you, if possible.  Clearly mark and make sure that you can see:  Any grab bars or handrails.  First and last steps.  Where the edge of each step is.  Use tools that help you move around (mobility aids) if they are needed. These include:  Canes.  Walkers.  Scooters.  Crutches.  Turn on the lights when you go into a dark area. Replace any light bulbs as soon as they burn out.  Set up your furniture so you have a clear path. Avoid moving your furniture around.  If any of your floors are uneven, fix them.  If there are any pets around you, be aware of where they are.  Review your medicines with your doctor. Some medicines can make you feel dizzy. This can increase your chance of falling. Ask your doctor what other things that you can do to help prevent falls. This information is not intended to replace advice given to you by your health care provider. Make sure you discuss any questions you have with your health care provider. Document Released: 09/18/2009 Document Revised: 04/29/2016 Document Reviewed: 12/27/2014 Elsevier Interactive Patient Education  2017 [employer].

## 2018-10-05 ENCOUNTER — Ambulatory Visit (INDEPENDENT_AMBULATORY_CARE_PROVIDER_SITE_OTHER): Payer: Medicare Other | Admitting: Family Medicine

## 2018-10-05 ENCOUNTER — Encounter: Payer: Self-pay | Admitting: Family Medicine

## 2018-10-05 ENCOUNTER — Other Ambulatory Visit: Payer: Self-pay | Admitting: Family Medicine

## 2018-10-05 VITALS — BP 136/66 | HR 78 | Temp 98.2°F | Ht 60.0 in | Wt 213.0 lb

## 2018-10-05 DIAGNOSIS — R1310 Dysphagia, unspecified: Secondary | ICD-10-CM

## 2018-10-05 DIAGNOSIS — Z0000 Encounter for general adult medical examination without abnormal findings: Secondary | ICD-10-CM | POA: Diagnosis not present

## 2018-10-05 DIAGNOSIS — E559 Vitamin D deficiency, unspecified: Secondary | ICD-10-CM | POA: Diagnosis not present

## 2018-10-05 DIAGNOSIS — I10 Essential (primary) hypertension: Secondary | ICD-10-CM | POA: Diagnosis not present

## 2018-10-05 DIAGNOSIS — K219 Gastro-esophageal reflux disease without esophagitis: Secondary | ICD-10-CM | POA: Insufficient documentation

## 2018-10-05 DIAGNOSIS — E7800 Pure hypercholesterolemia, unspecified: Secondary | ICD-10-CM | POA: Diagnosis not present

## 2018-10-05 DIAGNOSIS — R1319 Other dysphagia: Secondary | ICD-10-CM

## 2018-10-05 MED ORDER — OMEPRAZOLE 20 MG PO CPDR
20.0000 mg | DELAYED_RELEASE_CAPSULE | Freq: Every day | ORAL | 1 refills | Status: DC
Start: 2018-10-05 — End: 2018-10-05

## 2018-10-05 NOTE — Progress Notes (Signed)
Patient: Carrie Wheeler, Female    DOB: [birth year], 77 y.o.   MRN: [id number] Visit Date: 10/05/2018  Today's Provider: [name], MD   Chief Complaint  Patient presents with  . Medicare Wellness   Subjective:  I, Tiburcio Pea, CMA, am acting as a scribe for [name], MD.   Patient had a AWE with [facility] on 09/18/2018   Complete Physical Carrie Wheeler is a 77 y.o. female. She feels fairly well. She reports exercising lightly doing yard work, but no regular exercise. She reports she is sleeping fairly well.  Globus sensation and dysphagia when eating occasionally.  Occurring for a few months.  Has vomited a few times.  First occurred with pills, now food.  Has never taken PPI, but Tums help  Last mammogram 09/13/2017 - BIRADs1, wants to repeat in 2 yrs Colonoscopy in 11/2015 repeat in 1yrs -----------------------------------------------------------   Review of Systems  Constitutional: Negative.   HENT: Negative.   Eyes: Negative.   Respiratory: Negative.   Cardiovascular: Negative.   Gastrointestinal: Negative.   Endocrine: Negative.   Genitourinary: Negative.   Musculoskeletal: Negative.   Skin: Negative.   Allergic/Immunologic: Negative.   Neurological: Negative.   Hematological: Negative.   Psychiatric/Behavioral: Negative.     Social History   Socioeconomic History  . Marital status: Single    Spouse name: Not on file  . Number of children: 0  . Years of education: HS  . Highest education level: High school graduate  Occupational History    Employer: [employer]    Comment: part-time  Social Needs  . Financial resource strain: Not hard at all  . Food insecurity:    Worry: Never true    Inability: Never true  . Transportation needs:    Medical: No    Non-medical: No  Tobacco Use  . Smoking status: Never Smoker  . Smokeless tobacco: Never Used  Substance and Sexual Activity  . 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 Drug use: No  . Sexual activity: Not Currently  Lifestyle  . Physical activity:    Days per week: Not on file    Minutes per session: Not on file  . Stress: Not at all  Relationships  . Social connections:    Talks on phone: Patient refused    Gets together: Patient refused    Attends religious service: Patient refused    Active member of club or organization: Patient refused    Attends meetings of clubs or organizations: Patient refused    Relationship status: Patient refused  . Intimate partner violence:    Fear of current or ex partner: Patient refused    Emotionally abused: Patient refused    Physically abused: Patient refused    Forced sexual activity: Patient refused  Other Topics Concern  . Not on file  Social History Narrative   Had one child; a son who is now deceased due to liver disease.    Past Medical History:  Diagnosis Date  . Environmental allergies   . GERD (gastroesophageal reflux disease)    occasional  . Hyperlipidemia   . Hypertension      Patient Active Problem List   Diagnosis Date Noted  . Blurry vision 06/23/2018  . Benign neoplasm of ascending colon   . Benign neoplasm of transverse colon   . Essential (primary) hypertension 03/31/2015  . Morbid obesity ([place]) 03/31/2015  .  Hyperlipidemia 01/02/2010  . Avitaminosis D 12/11/2008  . Family history of cancer of digestive system 10/31/2006  . Allergic rhinitis 12/20/2001    Past Surgical History:  Procedure Laterality Date  . COLONOSCOPY    . COLONOSCOPY WITH PROPOFOL N/A 11/27/2015   Procedure: COLONOSCOPY WITH PROPOFOL;  Surgeon: Lucilla Lame, MD;  Location: [facility];  Service: Endoscopy;  Laterality: N/A;  . TONSILLECTOMY      Her family history includes Alzheimer's disease in her other; Colon cancer in her father and other; Diabetes in her mother and other; Healthy in her brother; Heart Problems in her brother; Hypertension in  her father; Liver disease in her son.      Current Outpatient Medications:  .  amLODipine (NORVASC) 10 MG tablet, Take 1 tablet (10 mg total) by mouth daily., Disp: 30 tablet, Rfl: 5 .  aspirin (ASPIRIN CHILDRENS) 81 MG chewable tablet, Chew 1 tablet (81 mg total) by mouth daily., Disp: 1 tablet, Rfl: 0 .  atorvastatin (LIPITOR) 40 MG tablet, Take 1 tablet (40 mg total) by mouth daily., Disp: 30 tablet, Rfl: 5 .  Bioflavonoid Products (VITAMIN C) CHEW, Chew by mouth. Reported on 11/27/2015, Disp: , Rfl:  .  calcium carbonate (OS-CAL) 600 MG TABS tablet, Take 1 tablet by mouth daily., Disp: , Rfl:  .  cetirizine (ZYRTEC) 10 MG tablet, Take 1 tablet by mouth daily., Disp: , Rfl:  .  Cholecalciferol (VITAMIN D) 2000 UNITS CAPS, Take by mouth daily. , Disp: , Rfl:  .  fluticasone (FLONASE) 50 MCG/ACT nasal spray, SHAKE LIQUID AND USE 2 SPRAYS IN EACH NOSTRIL EVERY DAY, Disp: 16 g, Rfl: 11 .  Lactobacillus (PROBIOTIC ACIDOPHILUS PO), Take 1 tablet by mouth daily., Disp: , Rfl:  .  montelukast (SINGULAIR) 10 MG tablet, TAKE 1 TABLET BY MOUTH AT BEDTIME, Disp: 90 tablet, Rfl: 2 .  Multiple Vitamins-Minerals (OCUVITE ADULT 50+ PO), Take by mouth daily. , Disp: , Rfl:  .  OMEGA-3 FATTY ACIDS PO, Taking 1300 mg capsules- 3 capsules daily, Disp: , Rfl:   Patient Care Team: Virginia Crews, MD as PCP - General (Family Medicine)     Objective:   Vitals: BP 136/66 (BP Location: Left Arm, Patient Position: Sitting, Cuff Size: Large)   Pulse 78   Temp 98.2 F (36.8 C) (Oral)   Ht 5' (1.524 m)   Wt 213 lb (96.6 kg)   SpO2 97%   BMI 41.60 kg/m   Physical Exam  Constitutional: She is oriented to person, place, and time. She appears well-developed and well-nourished. No distress.  HENT:  Head: Normocephalic and atraumatic.  Right Ear: External ear normal.  Left Ear: External ear normal.  Nose: Nose normal.  Mouth/Throat: Oropharynx is clear and moist.  Eyes: Pupils are equal, round, and  reactive to light. Conjunctivae and EOM are normal. No scleral icterus.  Neck: Neck supple. No thyromegaly present.  Cardiovascular: Normal rate, regular rhythm, normal heart sounds and intact distal pulses.  No murmur heard. Pulmonary/Chest: Effort normal and breath sounds normal. No respiratory distress. She has no wheezes. She has no rales.  Abdominal: Soft. Bowel sounds are normal. She exhibits no distension. There is no tenderness. There is no rebound and no guarding.  Musculoskeletal: She exhibits no edema or deformity.  Lymphadenopathy:    She has no cervical adenopathy.  Neurological: She is alert and oriented to person, place, and time.  Skin: Skin is warm and dry. Capillary refill takes less than 2 seconds. No rash  noted.  Psychiatric: She has a normal mood and affect. Her behavior is normal.  Vitals reviewed.   Activities of Daily Living In your present state of health, do you have any difficulty performing the following activities: 09/18/2018  Hearing? N  Vision? N  Difficulty concentrating or making decisions? N  Walking or climbing stairs? Y  Comment Due to leg and lower back pain.   Dressing or bathing? N  Doing errands, shopping? N  Preparing Food and eating ? N  Using the Toilet? N  In the past six months, have you accidently leaked urine? Y  Comment Wears protection daily.   Do you have problems with loss of bowel control? N  Managing your Medications? N  Managing your Finances? N  Housekeeping or managing your Housekeeping? N  Some recent data might be hidden    Fall Risk Assessment Fall Risk  09/18/2018 12/14/2017 09/12/2017 08/30/2016 05/19/2015  Falls in the past year? No No No No No     Depression Screen PHQ 2/9 Scores 09/18/2018 12/14/2017 09/12/2017 08/30/2016  PHQ - 2 Score 0 0 0 0    Assessment & Plan:    Annual Physical Reviewed patient's Family Medical History Reviewed and updated list of patient's medical providers Assessment of cognitive  impairment was done Assessed patient's functional ability Established a written schedule for health screening [place] Completed and Reviewed  Exercise Activities and Dietary recommendations Goals    . Exercise 3x per week (30 min per time)     Recommend to start some form of exercise 3 times a week for at least 30 minutes.          Immunization History  Administered Date(s) Administered  . Influenza, High Dose Seasonal PF 09/12/2017, 09/18/2018  . Pneumococcal Conjugate-13 05/19/2015  . Pneumococcal Polysaccharide-23 04/02/2011  . Td 06/01/2006, 08/30/2016  . Tdap 06/01/2006    Health Maintenance  Topic Date Due  . MAMMOGRAM  09/14/2019  . COLONOSCOPY  11/26/2020  . TETANUS/TDAP  08/30/2026  . INFLUENZA VACCINE  Completed  . DEXA SCAN  Completed  . PNA vac Low Risk Adult  Completed     Discussed health benefits of physical activity, and encouraged her to engage in regular exercise appropriate for her age and condition.    ------------------------------------------------------------------------------------------------------------  Problem List Items Addressed This Visit      Cardiovascular and Mediastinum   Essential (primary) hypertension     Digestive   Esophageal dysphagia    New problem Likely GERD Will treat with PPI x1 month If continues despite PPI therapy, consider GI referral for possible EGD      Relevant Orders   CBC w/Diff/Platelet     Other   Hyperlipidemia   Relevant Orders   Lipid panel   Comprehensive metabolic panel   Morbid obesity ([place])   Relevant Orders   CBC w/Diff/Platelet   Avitaminosis D   Relevant Orders   VITAMIN D 25 Hydroxy (Vit-D Deficiency, Fractures)    Other Visit Diagnoses    Encounter for annual physical exam    -  Primary   Relevant Orders   Lipid panel   Comprehensive metabolic panel   CBC w/Diff/Platelet   VITAMIN D 25 Hydroxy (Vit-D Deficiency, Fractures)       Return in about 4  weeks (around 11/02/2018) for dysphagia.   The entirety of the information documented in the History of Present Illness, Review of Systems and Physical Exam were personally obtained by me. Portions of this information  were initially documented by Tiburcio Pea, CMA and reviewed by me for thoroughness and accuracy.    Virginia Crews, MD, MPH [facility] 10/05/2018 10:09 AM

## 2018-10-05 NOTE — Patient Instructions (Addendum)
The CDC recommends two doses of Shingrix (the shingles vaccine) separated by 2 to 6 months for adults age 75 years and older. I recommend checking with your insurance plan regarding coverage for this vaccine.     Dysphagia Dysphagia is trouble swallowing. This condition occurs when solids and liquids stick in a person's throat on the way down to the stomach, or when food takes longer to get to the stomach. You may have problems swallowing food, liquids, or both. You may also have pain while trying to swallow. It may take you more time and effort to swallow something. What are the causes? This condition is caused by:  Problems with the muscles. They may make it difficult for you to move food and liquids through the tube that connects your mouth to your stomach (esophagus). You may have ulcers, scar tissue, or inflammation that blocks the normal passage of food and liquids. Causes of these problems include: ? Acid reflux from your stomach into your esophagus (gastroesophageal reflux). ? Infections. ? Radiation treatment for cancer. ? Medicines taken without enough fluids to wash them down into your stomach.  Nerve problems. These prevent signals from being sent to the muscles of your esophagus to squeeze (contract) and move what you swallow down to your stomach.  Globus pharyngeus. This is a common problem that involves feeling like something is stuck in the throat or a sense of trouble with swallowing even though nothing is wrong with the swallowing passages.  Stroke. This can affect the nerves and make it difficult to swallow.  Certain conditions, such as cerebral palsy or Parkinson disease.  What are the signs or symptoms? Common symptoms of this condition include:  A feeling that solids or liquids are stuck in your throat on the way down to the stomach.  Food taking too long to get to the stomach.  Other symptoms include:  Food moving back from your stomach to your mouth  (regurgitation).  Noises coming from your throat.  Chest discomfort with swallowing.  A feeling of fullness when swallowing.  Drooling, especially when the throat is blocked.  Pain while swallowing.  Heartburn.  Coughing or gagging while trying to swallow.  How is this diagnosed? This condition is diagnosed by:  Barium X-ray. In this test, you swallow a white substance (contrast medium)that sticks to the inside of your esophagus. X-ray images are then taken.  Endoscopy. In this test, a flexible telescope is inserted down your throat to look at your esophagus and your stomach.  CT scans and MRI.  How is this treated? Treatment for dysphagia depends on the cause of the condition:  If the dysphagia is caused by acid reflux or infection, medicines may be used. They may include antibiotics and heartburn medicines.  If the dysphagia is caused by problems with your muscles, swallowing therapy may be used to help you strengthen your swallowing muscles. You may have to do specific exercises to strengthen the muscles or stretch them.  If the dysphagia is caused by a blockage or mass, procedures to remove the blockage may be done. You may need surgery and a feeding tube.  You may need to make diet changes. Ask your health care provider for specific instructions. Follow these instructions at home: Eating and drinking  Try to eat soft food that is easier to swallow.  Follow any diet changes as told by your health care provider.  Cut your food into small pieces and eat slowly.  Eat and drink only when  you are sitting upright.  Do not drink alcohol or caffeine. If you need help quitting, ask your health care provider. General instructions  Check your weight every day to make sure you are not losing weight.  Take over-the-counter and prescription medicines only as told by your health care provider.  If you were prescribed an antibiotic medicine, take it as told by your health  care provider. Do not stop taking the antibiotic even if you start to feel better.  Do not use any products that contain nicotine or tobacco, such as cigarettes and e-cigarettes. If you need help quitting, ask your health care provider.  Keep all follow-up visits as told by your health care provider. This is important. Contact a health care provider if:  You lose weight because you cannot swallow.  You cough when you drink liquids (aspiration).  You cough up partially digested food. Get help right away if:  You cannot swallow your saliva.  You have shortness of breath or a fever, or both.  You have a hoarse voice and also have trouble swallowing. Summary  Dysphagia is trouble swallowing. This condition occurs when solids and liquids stick in a person's throat on the way down to the stomach, or when food takes longer to get to the stomach.  Dysphagia has many possible causes and symptoms.  Treatment for dysphagia depends on the cause of the condition. This information is not intended to replace advice given to you by your health care provider. Make sure you discuss any questions you have with your health care provider. Document Released: 11/19/2000 Document Revised: 11/11/2016 Document Reviewed: 11/11/2016 Elsevier Interactive Patient Education  2017 [place] 65 Years and Older, Female Preventive care refers to lifestyle choices and visits with your health care provider that can promote health and wellness. What does preventive care include?  A yearly physical exam. This is also called an annual well check.  Dental exams once or twice a year.  Routine eye exams. Ask your health care provider how often you should have your eyes checked.  Personal lifestyle choices, including: ? Daily care of your teeth and gums. ? Regular physical activity. ? Eating a healthy diet. ? Avoiding tobacco and drug use. ? Limiting alcohol use. ? Practicing safe  sex. ? Taking low-dose aspirin every day. ? Taking vitamin and mineral supplements as recommended by your health care provider. What happens during an annual well check? The services and screenings done by your health care provider during your annual well check will depend on your age, overall health, lifestyle risk factors, and family history of disease. Counseling Your health care provider may ask you questions about your:  Alcohol use.  Tobacco use.  Drug use.  Emotional well-being.  Home and relationship well-being.  Sexual activity.  Eating habits.  History of falls.  Memory and ability to understand (cognition).  Work and work Statistician.  Reproductive health.  Screening You may have the following tests or measurements:  Height, weight, and BMI.  Blood pressure.  Lipid and cholesterol levels. These may be checked every 5 years, or more frequently if you are over 82 years old.  Skin check.  Lung cancer screening. You may have this screening every year starting at age 12 if you have a 30-pack-year history of smoking and currently smoke or have quit within the past 15 years.  Fecal occult blood test (FOBT) of the stool. You may have this test every year starting at age 37.  Flexible sigmoidoscopy  or colonoscopy. You may have a sigmoidoscopy every 5 years or a colonoscopy every 10 years starting at age 52.  Hepatitis C blood test.  Hepatitis B blood test.  Sexually transmitted disease (STD) testing.  Diabetes screening. This is done by checking your blood sugar (glucose) after you have not eaten for a while (fasting). You may have this done every 1-3 years.  Bone density scan. This is done to screen for osteoporosis. You may have this done starting at age 41.  Mammogram. This may be done every 1-2 years. Talk to your health care provider about how often you should have regular mammograms.  Talk with your health care provider about your test results,  treatment options, and if necessary, the need for more tests. Vaccines Your health care provider may recommend certain vaccines, such as:  Influenza vaccine. This is recommended every year.  Tetanus, diphtheria, and acellular pertussis (Tdap, Td) vaccine. You may need a Td booster every 10 years.  Varicella vaccine. You may need this if you have not been vaccinated.  Zoster vaccine. You may need this after age 73.  Measles, mumps, and rubella (MMR) vaccine. You may need at least one dose of MMR if you were born in 1957 or later. You may also need a second dose.  Pneumococcal 13-valent conjugate (PCV13) vaccine. One dose is recommended after age 26.  Pneumococcal polysaccharide (PPSV23) vaccine. One dose is recommended after age 8.  Meningococcal vaccine. You may need this if you have certain conditions.  Hepatitis A vaccine. You may need this if you have certain conditions or if you travel or work in places where you may be exposed to hepatitis A.  Hepatitis B vaccine. You may need this if you have certain conditions or if you travel or work in places where you may be exposed to hepatitis B.  Haemophilus influenzae type b (Hib) vaccine. You may need this if you have certain conditions.  Talk to your health care provider about which screenings and vaccines you need and how often you need them. This information is not intended to replace advice given to you by your health care provider. Make sure you discuss any questions you have with your health care provider. Document Released: 12/19/2015 Document Revised: 08/11/2016 Document Reviewed: 09/23/2015 Elsevier Interactive Patient Education  [employer].

## 2018-10-05 NOTE — Assessment & Plan Note (Signed)
New problem Likely GERD Will treat with PPI x1 month If continues despite PPI therapy, consider GI referral for possible EGD

## 2018-10-06 LAB — COMPREHENSIVE METABOLIC PANEL
ALBUMIN: 4.1 g/dL (ref 3.5–4.8)
ALT: 17 IU/L (ref 0–32)
AST: 19 IU/L (ref 0–40)
Albumin/Globulin Ratio: 1.8 (ref 1.2–2.2)
Alkaline Phosphatase: 128 IU/L — ABNORMAL HIGH (ref 39–117)
BILIRUBIN TOTAL: 0.7 mg/dL (ref 0.0–1.2)
BUN / CREAT RATIO: 17 (ref 12–28)
BUN: 15 mg/dL (ref 8–27)
CHLORIDE: 104 mmol/L (ref 96–106)
CO2: 22 mmol/L (ref 20–29)
CREATININE: 0.88 mg/dL (ref 0.57–1.00)
Calcium: 9.5 mg/dL (ref 8.7–10.3)
GFR calc Af Amer: 75 mL/min/{1.73_m2} (ref >59)
GFR calc non Af Amer: 65 mL/min/{1.73_m2} (ref >59)
GLOBULIN, TOTAL: 2.3 g/dL (ref 1.5–4.5)
GLUCOSE: 88 mg/dL (ref 65–99)
Potassium: 4.5 mmol/L (ref 3.5–5.2)
SODIUM: 142 mmol/L (ref 134–144)
Total Protein: 6.4 g/dL (ref 6.0–8.5)

## 2018-10-06 LAB — CBC WITH DIFFERENTIAL/PLATELET
Basophils Absolute: 0 10*3/uL (ref 0.0–0.2)
Basos: 1 %
EOS (ABSOLUTE): 0.3 10*3/uL (ref 0.0–0.4)
Eos: 3 %
HEMATOCRIT: 38 % (ref 34.0–46.6)
HEMOGLOBIN: 12.9 g/dL (ref 11.1–15.9)
Immature Grans (Abs): 0 10*3/uL (ref 0.0–0.1)
Immature Granulocytes: 0 %
LYMPHS ABS: 3.1 10*3/uL (ref 0.7–3.1)
Lymphs: 38 %
MCH: 30.2 pg (ref 26.6–33.0)
MCHC: 33.9 g/dL (ref 31.5–35.7)
MCV: 89 fL (ref 79–97)
MONOCYTES: 6 %
Monocytes Absolute: 0.5 10*3/uL (ref 0.1–0.9)
NEUTROS ABS: 4.3 10*3/uL (ref 1.4–7.0)
Neutrophils: 52 %
Platelets: 256 10*3/uL (ref 150–450)
RBC: 4.27 x10E6/uL (ref 3.77–5.28)
RDW: 13.4 % (ref 12.3–15.4)
WBC: 8.2 10*3/uL (ref 3.4–10.8)

## 2018-10-06 LAB — VITAMIN D 25 HYDROXY (VIT D DEFICIENCY, FRACTURES): VIT D 25 HYDROXY: 29.1 ng/mL — AB (ref 30.0–100.0)

## 2018-10-06 LAB — LIPID PANEL
CHOLESTEROL TOTAL: 129 mg/dL (ref 100–199)
Chol/HDL Ratio: 2.3 ratio (ref 0.0–4.4)
HDL: 57 mg/dL (ref >39)
LDL Calculated: 59 mg/dL (ref 0–99)
Triglycerides: 65 mg/dL (ref 0–149)
VLDL CHOLESTEROL CAL: 13 mg/dL (ref 5–40)

## 2018-11-08 ENCOUNTER — Encounter: Payer: Self-pay | Admitting: Family Medicine

## 2018-11-08 ENCOUNTER — Ambulatory Visit (INDEPENDENT_AMBULATORY_CARE_PROVIDER_SITE_OTHER): Payer: Medicare Other | Admitting: Family Medicine

## 2018-11-08 VITALS — BP 146/78 | HR 76 | Temp 98.1°F | Wt 217.2 lb

## 2018-11-08 DIAGNOSIS — K219 Gastro-esophageal reflux disease without esophagitis: Secondary | ICD-10-CM | POA: Diagnosis not present

## 2018-11-08 NOTE — Assessment & Plan Note (Signed)
Previously with dysphagia that is now resolved. Well-controlled Now off of PPI She will keep PPI around and if dysphasia or burning recurs, she can do a 2-week course of this In the future, if PPI is not working things get worse, we may consider GI referral for possible EGD, but this is not indicated at this time Return precautions discussed

## 2018-11-08 NOTE — Progress Notes (Signed)
Patient: Carrie Wheeler Female    DOB: 1941/08/30   77 y.o.   MRN: [id number] Visit Date: 11/08/2018  Today's Provider: [name], MD   Chief Complaint  Patient presents with  . Dysphagia   Subjective:    I, Tiburcio Pea, CMA, am acting as a scribe for [name], MD.   HPI Dysphagia:  Patient presents for a 1 month follow up. Last OV was on 10/05/2018. Patient diagnosed with GERD and treated with PPI for 1 month. She reports fair compliance with treatment plan. She states she stopped taking Omeprazole a few days ago. She states symptoms are improved.     No Known Allergies   Current Outpatient Medications:  .  amLODipine (NORVASC) 10 MG tablet, Take 1 tablet (10 mg total) by mouth daily., Disp: 30 tablet, Rfl: 5 .  aspirin (ASPIRIN CHILDRENS) 81 MG chewable tablet, Chew 1 tablet (81 mg total) by mouth daily., Disp: 1 tablet, Rfl: 0 .  atorvastatin (LIPITOR) 40 MG tablet, Take 1 tablet (40 mg total) by mouth daily., Disp: 30 tablet, Rfl: 5 .  Bioflavonoid Products (VITAMIN C) CHEW, Chew by mouth. Reported on 11/27/2015, Disp: , Rfl:  .  calcium carbonate (OS-CAL) 600 MG TABS tablet, Take 1 tablet by mouth daily., Disp: , Rfl:  .  cetirizine (ZYRTEC) 10 MG tablet, Take 1 tablet by mouth daily., Disp: , Rfl:  .  Cholecalciferol (VITAMIN D) 2000 UNITS CAPS, Take by mouth daily. , Disp: , Rfl:  .  fluticasone (FLONASE) 50 MCG/ACT nasal spray, SHAKE LIQUID AND USE 2 SPRAYS IN EACH NOSTRIL EVERY DAY, Disp: 16 g, Rfl: 11 .  Lactobacillus (PROBIOTIC ACIDOPHILUS PO), Take 1 tablet by mouth daily., Disp: , Rfl:  .  montelukast (SINGULAIR) 10 MG tablet, TAKE 1 TABLET BY MOUTH AT BEDTIME, Disp: 90 tablet, Rfl: 2 .  Multiple Vitamins-Minerals (OCUVITE ADULT 50+ PO), Take by mouth daily. , Disp: , Rfl:  .  OMEGA-3 FATTY ACIDS PO, Taking 1300 mg capsules- 3 capsules daily, Disp: , Rfl:  .  omeprazole (PRILOSEC) 20 MG capsule, TAKE 1 CAPSULE(20 MG) BY MOUTH DAILY (Patient  not taking: Reported on 11/08/2018), Disp: 90 capsule, Rfl: 1  Review of Systems  Constitutional: Negative.   HENT: Negative.   Respiratory: Negative.   Cardiovascular: Negative.   Gastrointestinal: Negative.   Musculoskeletal: Negative.   Neurological: Negative.     Social History   Tobacco Use  . Smoking status: Never Smoker  . Smokeless tobacco: Never Used  Substance Use Topics  . 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 She appears well-developed and well-nourished. No distress.  HENT:  Head: Normocephalic and atraumatic.  Mouth/Throat: Oropharynx is clear and moist.  Eyes: Conjunctivae are normal. No scleral icterus.  Neck: Neck supple. No thyromegaly present.  Cardiovascular: Normal rate, regular rhythm and normal heart sounds.  No murmur heard. Pulmonary/Chest: Effort normal and breath sounds normal. No respiratory distress. She has no wheezes. She has no rales.  Abdominal: Soft. She exhibits no distension. There is no tenderness.  Musculoskeletal: She exhibits no edema.  Lymphadenopathy:    She has no cervical adenopathy.  Neurological: She is alert and oriented to person, place, and time.  Skin: Skin is warm and dry. Capillary refill takes less than 2 seconds. No rash noted.  Psychiatric: She has a normal mood and affect. Her behavior is normal.  Vitals reviewed.       Assessment & Plan:   Problem List Items Addressed This Visit      Digestive   GERD (gastroesophageal reflux disease) - Primary      Previously with dysphagia that is now resolved. Well-controlled Now off of PPI She will keep PPI around and if dysphasia or burning recurs, she can do a 2-week course of this In the future, if PPI is not working things get worse, we may consider GI referral for possible EGD, but this is not indicated at this time Return precautions discussed          Return in about 5 months (around 04/09/2019) for BP f/u.   The entirety of the information documented in the History of Present Illness, Review of Systems and Physical Exam were personally obtained by me. Portions of this information were initially documented by Tiburcio Pea, CMA and reviewed by me for thoroughness and accuracy.    Virginia Crews, MD, MPH [facility] 11/08/2018 10:00 AM

## 2018-11-25 ENCOUNTER — Other Ambulatory Visit: Payer: Self-pay | Admitting: Family Medicine

## 2018-11-25 DIAGNOSIS — I10 Essential (primary) hypertension: Secondary | ICD-10-CM

## 2018-11-27 NOTE — Telephone Encounter (Signed)
Dr. Brita Romp patient.

## 2018-12-04 ENCOUNTER — Other Ambulatory Visit: Payer: Self-pay | Admitting: Family Medicine

## 2018-12-04 DIAGNOSIS — J302 Other seasonal allergic rhinitis: Secondary | ICD-10-CM

## 2018-12-31 ENCOUNTER — Other Ambulatory Visit: Payer: Self-pay | Admitting: Family Medicine

## 2019-03-01 ENCOUNTER — Other Ambulatory Visit: Payer: Self-pay | Admitting: Family Medicine

## 2019-03-01 DIAGNOSIS — J302 Other seasonal allergic rhinitis: Secondary | ICD-10-CM

## 2019-04-11 ENCOUNTER — Ambulatory Visit: Payer: Medicare Other | Admitting: Family Medicine

## 2019-04-26 ENCOUNTER — Ambulatory Visit: Payer: Self-pay | Admitting: Family Medicine

## 2019-05-12 ENCOUNTER — Other Ambulatory Visit: Payer: Self-pay | Admitting: Physician Assistant

## 2019-05-12 DIAGNOSIS — I10 Essential (primary) hypertension: Secondary | ICD-10-CM

## 2019-06-18 ENCOUNTER — Ambulatory Visit (INDEPENDENT_AMBULATORY_CARE_PROVIDER_SITE_OTHER): Payer: Medicare Other | Admitting: Family Medicine

## 2019-06-18 ENCOUNTER — Other Ambulatory Visit: Payer: Self-pay

## 2019-06-18 ENCOUNTER — Encounter: Payer: Self-pay | Admitting: Family Medicine

## 2019-06-18 VITALS — BP 138/81 | HR 60 | Temp 97.7°F | Resp 16 | Ht 60.0 in | Wt 210.0 lb

## 2019-06-18 DIAGNOSIS — E7800 Pure hypercholesterolemia, unspecified: Secondary | ICD-10-CM

## 2019-06-18 DIAGNOSIS — I10 Essential (primary) hypertension: Secondary | ICD-10-CM | POA: Diagnosis not present

## 2019-06-18 DIAGNOSIS — E559 Vitamin D deficiency, unspecified: Secondary | ICD-10-CM

## 2019-06-18 NOTE — Assessment & Plan Note (Signed)
Well controlled Continue current  meds Repeat metabolic panel F/u at CPE

## 2019-06-18 NOTE — Assessment & Plan Note (Signed)
Continue OTC supplement Recheck Vit D level today

## 2019-06-18 NOTE — Assessment & Plan Note (Signed)
Previously well controlled Continue atorvastatin Recheck FLP and CMP 

## 2019-06-18 NOTE — Progress Notes (Signed)
Patient: Carrie Wheeler Female    DOB: Dec 21, 1940   77 y.o.   MRN: [id number] Visit Date: 06/18/2019  Today's Provider: [name], MD   Chief Complaint  Patient presents with  . Follow-up  . Hypertension   Subjective:     HPI     Hypertension, follow-up:  BP Readings from Last 3 Encounters:  06/18/19 138/81  11/08/18 (!) 146/78  10/05/18 136/66    She was last seen for hypertension 8 months ago.  BP at that visit was 136/66. Management since that visit includesno changes, .She reports good compliance with treatment. She is not having side effects. none She is not exercising. She is adherent to low salt diet.   Outside blood pressures are not checking. She is experiencing none.  Patient denies none.   Cardiovascular risk factors include advanced age (older than 39 for men, 34 for women).  Use of agents associated with hypertension: none.   -----------------------------------------------------------------  Has been taking statin x1 yr. Tolerating it well. No side effects. Good compliance.  Takig Vit D supplement  No Known Allergies   Current Outpatient Medications:  .  amLODipine (NORVASC) 10 MG tablet, TAKE 1 TABLET(10 MG) BY MOUTH DAILY, Disp: 30 tablet, Rfl: 2 .  aspirin (ASPIRIN CHILDRENS) 81 MG chewable tablet, Chew 1 tablet (81 mg total) by mouth daily., Disp: 1 tablet, Rfl: 0 .  atorvastatin (LIPITOR) 40 MG tablet, TAKE 1 TABLET(40 MG) BY MOUTH DAILY, Disp: 90 tablet, Rfl: 3 .  Bioflavonoid Products (VITAMIN C) CHEW, Chew by mouth. Reported on 11/27/2015, Disp: , Rfl:  .  calcium carbonate (OS-CAL) 600 MG TABS tablet, Take 1 tablet by mouth daily., Disp: , Rfl:  .  Cholecalciferol (VITAMIN D) 2000 UNITS CAPS, Take by mouth daily. , Disp: , Rfl:  .  fluticasone (FLONASE) 50 MCG/ACT nasal spray, SHAKE LIQUID AND USE 2 SPRAYS IN EACH NOSTRIL EVERY DAY, Disp: 16 g, Rfl: 11 .  Lactobacillus (PROBIOTIC ACIDOPHILUS PO), Take 1 tablet by mouth  daily., Disp: , Rfl:  .  Multiple Vitamins-Minerals (OCUVITE ADULT 50+ PO), Take by mouth daily. , Disp: , Rfl:  .  cetirizine (ZYRTEC) 10 MG tablet, Take 1 tablet by mouth daily., Disp: , Rfl:  .  montelukast (SINGULAIR) 10 MG tablet, TAKE 1 TABLET BY MOUTH AT BEDTIME (Patient not taking: Reported on 06/18/2019), Disp: 90 tablet, Rfl: 2 .  OMEGA-3 FATTY ACIDS PO, Taking 1300 mg capsules- 3 capsules daily, Disp: , Rfl:   Review of Systems  Constitutional: Negative for appetite change, chills, fatigue and fever.  Respiratory: Negative for chest tightness and shortness of breath.   Cardiovascular: Negative for chest pain and palpitations.  Gastrointestinal: Negative for abdominal pain, nausea and vomiting.  Neurological: Negative for dizziness and weakness.    Social History   Tobacco Use  . Smoking status: Never Smoker  . Smokeless tobacco: Never Used  Substance Use Topics  . 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  Constitutional:      General: She is not in acute distress.    Appearance: Normal appearance. She is well-developed. She is not diaphoretic.  HENT:     Head: Normocephalic and atraumatic.  Eyes:     General: No scleral icterus.    Conjunctiva/sclera: Conjunctivae normal.  Neck:     Thyroid: No thyromegaly.  Cardiovascular:     Rate and Rhythm: Normal rate and regular rhythm.     Pulses: Normal pulses.     Heart sounds: Normal heart sounds. No murmur.  Pulmonary:     Effort: Pulmonary effort  is normal. No respiratory distress.     Breath sounds: Normal breath sounds. No wheezing, rhonchi or rales.  Musculoskeletal:     Right lower leg: No edema.     Left lower leg: No edema.  Skin:    General: Skin is warm and dry.     Capillary Refill: Capillary refill takes less than 2 seconds.     Findings: No rash.  Neurological:     Mental Status: She is alert and oriented to person, place, and time. Mental status is at baseline.  Psychiatric:        Mood and Affect: Mood normal.        Behavior: Behavior normal.      No results found for any visits on 06/18/19.     Assessment & Plan   Problem List Items Addressed This Visit      Cardiovascular and Mediastinum   Essential (primary) hypertension - Primary    Well controlled Continue current  meds Repeat metabolic panel F/u at CPE      Relevant Orders   Comprehensive metabolic panel     Other   Hyperlipidemia    Previously well controlled Continue atorvastatin Recheck FLP and CMP      Relevant Orders   Comprehensive metabolic panel   Lipid panel   Morbid obesity ([place])    Discussed importance of healthy weight management Discussed diet and exercise      Avitaminosis D    Continue OTC supplement Recheck Vit D level today      Relevant Orders   VITAMIN D 25 Hydroxy (Vit-D Deficiency, Fractures)       Return in about 4 months (around 10/19/2019) for CPE/AWV as scheduled.   The entirety of the information documented in the History of Present Illness, Review of Systems and Physical Exam were personally obtained by me. Portions of this information were initially documented by April Miller, CMA and reviewed by me for thoroughness and accuracy.    , Dionne Bucy, MD MPH [facility]

## 2019-06-18 NOTE — Assessment & Plan Note (Signed)
Discussed importance of healthy weight management Discussed diet and exercise  

## 2019-06-19 LAB — COMPREHENSIVE METABOLIC PANEL
ALT: 14 IU/L (ref 0–32)
AST: 16 IU/L (ref 0–40)
Albumin/Globulin Ratio: 1.9 (ref 1.2–2.2)
Albumin: 4.2 g/dL (ref 3.7–4.7)
Alkaline Phosphatase: 114 IU/L (ref 39–117)
BUN/Creatinine Ratio: 16 (ref 12–28)
BUN: 15 mg/dL (ref 8–27)
Bilirubin Total: 0.5 mg/dL (ref 0.0–1.2)
CO2: 24 mmol/L (ref 20–29)
Calcium: 9.4 mg/dL (ref 8.7–10.3)
Chloride: 107 mmol/L — ABNORMAL HIGH (ref 96–106)
Creatinine, Ser: 0.94 mg/dL (ref 0.57–1.00)
GFR calc Af Amer: 69 mL/min/{1.73_m2} (ref >59)
GFR calc non Af Amer: 60 mL/min/{1.73_m2} (ref >59)
Globulin, Total: 2.2 g/dL (ref 1.5–4.5)
Glucose: 96 mg/dL (ref 65–99)
Potassium: 5.2 mmol/L (ref 3.5–5.2)
Sodium: 145 mmol/L — ABNORMAL HIGH (ref 134–144)
Total Protein: 6.4 g/dL (ref 6.0–8.5)

## 2019-06-19 LAB — LIPID PANEL
Chol/HDL Ratio: 2.6 ratio (ref 0.0–4.4)
Cholesterol, Total: 133 mg/dL (ref 100–199)
HDL: 51 mg/dL (ref >39)
LDL Calculated: 64 mg/dL (ref 0–99)
Triglycerides: 88 mg/dL (ref 0–149)
VLDL Cholesterol Cal: 18 mg/dL (ref 5–40)

## 2019-06-19 LAB — VITAMIN D 25 HYDROXY (VIT D DEFICIENCY, FRACTURES): Vit D, 25-Hydroxy: 36.6 ng/mL (ref 30.0–100.0)

## 2019-08-10 ENCOUNTER — Other Ambulatory Visit: Payer: Self-pay | Admitting: Family Medicine

## 2019-08-10 DIAGNOSIS — I10 Essential (primary) hypertension: Secondary | ICD-10-CM

## 2019-08-10 MED ORDER — AMLODIPINE BESYLATE 10 MG PO TABS: ORAL_TABLET | ORAL | 1 refills | Status: DC

## 2019-08-10 NOTE — Telephone Encounter (Signed)
Pt needing a refill on:  amLODipine (NORVASC) 10 MG tablet   Please refill at:  [facility]  Thanks, [employer]

## 2019-09-20 ENCOUNTER — Ambulatory Visit: Payer: Medicare Other

## 2019-10-08 NOTE — Progress Notes (Signed)
Subjective:   Carrie Wheeler is a 78 y.o. female who presents for Medicare Annual (Subsequent) preventive examination.    This visit is being conducted through telemedicine due to the COVID-19 pandemic. This patient has given me verbal consent via doximity to conduct this visit, patient states they are participating from their home address. Some vital signs may be absent or patient reported.    Patient identification: identified by name, DOB, and current address  Review of Systems:  N/A  Cardiac Risk Factors include: advanced age (>47men, >76 women);dyslipidemia;hypertension     Objective:     Vitals: There were no vitals taken for this visit.  There is no height or weight on file to calculate BMI. Unable to obtain vitals due to visit being conducted via telephonically.   Advanced Directives 10/09/2019 09/18/2018 09/12/2017 11/27/2015 11/17/2015 09/19/2015  Does Patient Have a Medical Advance Directive? Yes Yes Yes Yes No Yes  Type of Paramedic of [place];Living will [place];Living will [place];Living will [place];Living will - Living will;Healthcare Power of Attorney  Does patient want to make changes to medical advance directive? - - - No - Patient declined - -  Copy of [place] in Chart? No - copy requested No - copy requested - No - copy requested - -  Would patient like information on creating a medical advance directive? - - - - Yes - Educational materials given -    Tobacco Social History   Tobacco Use  Smoking Status Never Smoker  Smokeless Tobacco Never Used     Counseling given: Not Answered   Clinical Intake:  Pre-visit preparation completed: Yes  Pain : No/denies pain Pain Score: 0-No pain     Nutritional Risks: None Diabetes: No  How often do you need to have someone help you when you read instructions, pamphlets, or other written materials  from your doctor or pharmacy?: 1 - Never  Interpreter Needed?: No  Information entered by :: [facility], LPN  Past Medical History:  Diagnosis Date  . Environmental allergies   . GERD (gastroesophageal reflux disease)    occasional  . Hyperlipidemia   . Hypertension    Past Surgical History:  Procedure Laterality Date  . COLONOSCOPY    . COLONOSCOPY WITH PROPOFOL N/A 11/27/2015   Procedure: COLONOSCOPY WITH PROPOFOL;  Surgeon: Lucilla Lame, MD;  Location: [place];  Service: Endoscopy;  Laterality: N/A;  . TONSILLECTOMY     Family History  Problem Relation Age of Onset  . Hypertension Father   . Colon cancer Father   . Alzheimer's disease Other   . Diabetes Other   . Colon cancer Other   . Liver disease Son   . Diabetes Mother   . Heart Problems Brother        Congential heart defect  . Healthy Brother    Social History   Socioeconomic History  . Marital status: Single    Spouse name: Not on file  . Number of children: 0  . Years of education: HS  . Highest education level: High school graduate  Occupational History    Employer: [employer]    Comment: part-time  Social Needs  . Financial resource strain: Not hard at all  . Food insecurity    Worry: Never true    Inability: Never true  . Transportation needs    Medical: No    Non-medical: No  Tobacco Use  . Smoking  status: Never Smoker  . Smokeless tobacco: Never Used  Substance and Sexual Activity  . 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 Drug use: No  . Sexual activity: Not Currently  Lifestyle  . Physical activity    Days per week: 0 days    Minutes per session: 0 min  . Stress: Not at all  Relationships  . Social Herbalist on phone: Patient refused    Gets together: Patient refused    Attends religious service: Patient refused    Active member of club or organization: Patient refused    Attends meetings of clubs or  organizations: Patient refused    Relationship status: Patient refused  Other Topics Concern  . Not on file  Social History Narrative   Had one child; a son who is now deceased due to liver disease.    Outpatient Encounter Medications as of 10/09/2019  Medication Sig  . amLODipine (NORVASC) 10 MG tablet TAKE 1 TABLET(10 MG) BY MOUTH DAILY  . aspirin (ASPIRIN CHILDRENS) 81 MG chewable tablet Chew 1 tablet (81 mg total) by mouth daily.  Marland Kitchen atorvastatin (LIPITOR) 40 MG tablet TAKE 1 TABLET(40 MG) BY MOUTH DAILY  . BIOTIN PO Take 2 tablets by mouth daily. Unsure dose  . calcium carbonate (OS-CAL) 600 MG TABS tablet Take 1 tablet by mouth daily.  . cetirizine (ZYRTEC) 10 MG tablet Take 1 tablet by mouth daily. As needed  . Cholecalciferol (VITAMIN D) 2000 UNITS CAPS Take by mouth daily.   . fluticasone (FLONASE) 50 MCG/ACT nasal spray SHAKE LIQUID AND USE 2 SPRAYS IN EACH NOSTRIL EVERY DAY  . Lactobacillus (PROBIOTIC ACIDOPHILUS PO) Take 1 tablet by mouth daily.  . montelukast (SINGULAIR) 10 MG tablet TAKE 1 TABLET BY MOUTH AT BEDTIME (Patient taking differently: As needed)  . Multiple Vitamins-Minerals (OCUVITE ADULT 50+ PO) Take by mouth daily.   . OMEGA-3 FATTY ACIDS PO Taking 1300 mg capsules- 3 capsules occasionally  . Bioflavonoid Products (VITAMIN C) CHEW Chew by mouth. Reported on 11/27/2015   No facility-administered encounter medications on file as of 10/09/2019.     Activities of Daily Living In your present state of health, do you have any difficulty performing the following activities: 10/09/2019  Hearing? N  Vision? N  Difficulty concentrating or making decisions? N  Walking or climbing stairs? N  Dressing or bathing? N  Doing errands, shopping? N  Preparing Food and eating ? N  Using the Toilet? N  In the past six months, have you accidently leaked urine? Y  Comment Often- wears protection daily.  Do you have problems with loss of bowel control? N  Managing your  Medications? N  Managing your Finances? N  Housekeeping or managing your Housekeeping? N  Some recent data might be hidden    Patient Care Team: Virginia Crews, MD as PCP - General (Family Medicine) Judee Clara, DO as Referring Physician (Chiropractic Medicine)    Assessment:   This is a routine wellness examination for Elinore.  Exercise Activities and Dietary recommendations Current Exercise Habits: The patient does not participate in regular exercise at present, Exercise limited by: None identified  Goals    . Exercise 3x per week (30 min per time)     Recommend to start some form of exercise 3 times a week for at least 30 minutes.          Fall Risk:  Fall Risk  10/09/2019 10/09/2019 09/18/2018 12/14/2017 09/12/2017  Falls in the past year? 1 1 No No No  Number falls in past yr: 0 0 - - -  Injury with Fall? 0 0 - - -  Follow up - Falls prevention discussed - - -    FALL RISK PREVENTION PERTAINING TO THE HOME:  Any stairs in or around the home? Yes  If so, are there any without handrails? No   Home free of loose throw rugs in walkways, pet beds, electrical cords, etc? Yes  Adequate lighting in your home to reduce risk of falls? Yes   ASSISTIVE DEVICES UTILIZED TO PREVENT FALLS:  Life alert? No  Use of a cane, walker or w/c? Yes  Grab bars in the bathroom? Yes  Shower chair or bench in shower? Yes  Elevated toilet seat or a handicapped toilet? No    TIMED UP AND GO:  Was the test performed? No .    Depression Screen PHQ 2/9 Scores 10/09/2019 09/18/2018 12/14/2017 09/12/2017  PHQ - 2 Score 0 0 0 0     Cognitive Function: Declined today.         Immunization History  Administered Date(s) Administered  . Influenza, High Dose Seasonal PF 09/12/2017, 09/18/2018, 10/03/2019  . Pneumococcal Conjugate-13 05/19/2015  . Pneumococcal Polysaccharide-23 04/02/2011  . Td 06/01/2006, 08/30/2016  . Tdap 06/01/2006    Qualifies for Shingles Vaccine? Yes . Due  for Shingrix. Education has been provided regarding the importance of this vaccine. Pt has been advised to call insurance company to determine out of pocket expense. Advised may also receive vaccine at local pharmacy or Health Dept. Verbalized acceptance and understanding.  Tdap: Up to date  Flu Vaccine: Up to date  Pneumococcal Vaccine: Completed series  Screening Tests Health Maintenance  Topic Date Due  . COLONOSCOPY  11/26/2020  . TETANUS/TDAP  08/30/2026  . INFLUENZA VACCINE  Completed  . DEXA SCAN  Completed  . PNA vac Low Risk Adult  Completed    Cancer Screenings:  Colorectal Screening: Completed 11/27/15. Repeat every 5 years.  Mammogram: No longer required.   Bone Density: Completed 10/30/14. Results reflect NORMAL. No repeat needed unless advised by a physician.  Lung Cancer Screening: (Low Dose CT Chest recommended if Age 59-80 years, 30 pack-year currently smoking OR have quit w/in 15years.) does not qualify.   Additional Screening:  Vision Screening: Recommended annual ophthalmology exams for early detection of glaucoma and other disorders of the eye.  Dental Screening: Recommended annual dental exams for proper oral hygiene  Community Resource Referral:  CRR required this visit?  No       Plan:  I have personally reviewed and addressed the Medicare Annual Wellness questionnaire and have noted the following in the patient's chart:  A. Medical and social history B. Use of alcohol, tobacco or illicit drugs  C. Current medications and supplements D. Functional ability and status E.  Nutritional status F.  Physical activity G. Advance directives H. List of other physicians I.  Hospitalizations, surgeries, and ER visits in previous 12 months J.  [place] such as hearing and vision if needed, cognitive and depression L. Referrals and appointments   In addition, I have reviewed and discussed with patient certain preventive protocols, quality  metrics, and best practice recommendations. A written personalized care plan for preventive services as well as general preventive health recommendations were provided to patient. Nurse Health Advisor  Signed,    Fabio Neighbors, LPN  10/09/2019 Nurse Health Advisor   Nurse Notes: None.

## 2019-10-09 ENCOUNTER — Other Ambulatory Visit: Payer: Self-pay

## 2019-10-09 ENCOUNTER — Ambulatory Visit (INDEPENDENT_AMBULATORY_CARE_PROVIDER_SITE_OTHER): Payer: Medicare Other

## 2019-10-09 DIAGNOSIS — Z0000 Encounter for general adult medical examination without abnormal findings: Secondary | ICD-10-CM

## 2019-10-09 NOTE — Patient Instructions (Signed)
Carrie Wheeler , Thank you for taking time to come for your Medicare Wellness Visit. I appreciate your ongoing commitment to your health goals. Please review the following plan we discussed and let me know if I can assist you in the future.   Screening recommendations/referrals: Colonoscopy: Up to date, due 11/2020 Mammogram: No longer required.  Bone Density: Up to date. Previous DEXA was normal. No repeat needed unless advised by a physician.  Recommended yearly ophthalmology/optometry visit for glaucoma screening and checkup Recommended yearly dental visit for hygiene and checkup  Vaccinations: Influenza vaccine: Up to date Pneumococcal vaccine: Completed series Tdap vaccine: Up to date, due 08/2026 Shingles vaccine: Pt declines today.     Advanced directives: Please bring a copy of your POA (Power of Attorney) and/or Living Will to your next appointment.   Conditions/risks identified: Recommend to start exercising 3 days a week for at least 30 minutes at a time.   Next appointment: 10/11/19 @ 9:20 AM with Dr Mariann Barter   Preventive Care 34 Years and Older, Female Preventive care refers to lifestyle choices and visits with your health care provider that can promote health and wellness. What does preventive care include?  A yearly physical exam. This is also called an annual well check.  Dental exams once or twice a year.  Routine eye exams. Ask your health care provider how often you should have your eyes checked.  Personal lifestyle choices, including:  Daily care of your teeth and gums.  Regular physical activity.  Eating a healthy diet.  Avoiding tobacco and drug use.  Limiting alcohol use.  Practicing safe sex.  Taking low-dose aspirin every day.  Taking vitamin and mineral supplements as recommended by your health care provider. What happens during an annual well check? The services and screenings done by your health care provider during your annual well check  will depend on your age, overall health, lifestyle risk factors, and family history of disease. Counseling  Your health care provider may ask you questions about your:  Alcohol use.  Tobacco use.  Drug use.  Emotional well-being.  Home and relationship well-being.  Sexual activity.  Eating habits.  History of falls.  Memory and ability to understand (cognition).  Work and work Statistician.  Reproductive health. Screening  You may have the following tests or measurements:  Height, weight, and BMI.  Blood pressure.  Lipid and cholesterol levels. These may be checked every 5 years, or more frequently if you are over 52 years old.  Skin check.  Lung cancer screening. You may have this screening every year starting at age 66 if you have a 30-pack-year history of smoking and currently smoke or have quit within the past 15 years.  Fecal occult blood test (FOBT) of the stool. You may have this test every year starting at age 30.  Flexible sigmoidoscopy or colonoscopy. You may have a sigmoidoscopy every 5 years or a colonoscopy every 10 years starting at age 54.  Hepatitis C blood test.  Hepatitis B blood test.  Sexually transmitted disease (STD) testing.  Diabetes screening. This is done by checking your blood sugar (glucose) after you have not eaten for a while (fasting). You may have this done every 1-3 years.  Bone density scan. This is done to screen for osteoporosis. You may have this done starting at age 26.  Mammogram. This may be done every 1-2 years. Talk to your health care provider about how often you should have regular mammograms. Talk with your health  care provider about your test results, treatment options, and if necessary, the need for more tests. Vaccines  Your health care provider may recommend certain vaccines, such as:  Influenza vaccine. This is recommended every year.  Tetanus, diphtheria, and acellular pertussis (Tdap, Td) vaccine. You may  need a Td booster every 10 years.  Zoster vaccine. You may need this after age 43.  Pneumococcal 13-valent conjugate (PCV13) vaccine. One dose is recommended after age 53.  Pneumococcal polysaccharide (PPSV23) vaccine. One dose is recommended after age 57. Talk to your health care provider about which screenings and vaccines you need and how often you need them. This information is not intended to replace advice given to you by your health care provider. Make sure you discuss any questions you have with your health care provider. Document Released: 12/19/2015 Document Revised: 08/11/2016 Document Reviewed: 09/23/2015 Elsevier Interactive Patient Education  2017 [place] Prevention in the Home Falls can cause injuries. They can happen to people of all ages. There are many things you can do to make your home safe and to help prevent falls. What can I do on the outside of my home?  Regularly fix the edges of walkways and driveways and fix any cracks.  Remove anything that might make you trip as you walk through a door, such as a raised step or threshold.  Trim any bushes or trees on the path to your home.  Use bright outdoor lighting.  Clear any walking paths of anything that might make someone trip, such as rocks or tools.  Regularly check to see if handrails are loose or broken. Make sure that both sides of any steps have handrails.  Any raised decks and porches should have guardrails on the edges.  Have any leaves, snow, or ice cleared regularly.  Use sand or salt on walking paths during winter.  Clean up any spills in your garage right away. This includes oil or grease spills. What can I do in the bathroom?  Use night lights.  Install grab bars by the toilet and in the tub and shower. Do not use towel bars as grab bars.  Use non-skid mats or decals in the tub or shower.  If you need to sit down in the shower, use a plastic, non-slip stool.  Keep the floor  dry. Clean up any water that spills on the floor as soon as it happens.  Remove soap buildup in the tub or shower regularly.  Attach bath mats securely with double-sided non-slip rug tape.  Do not have throw rugs and other things on the floor that can make you trip. What can I do in the bedroom?  Use night lights.  Make sure that you have a light by your bed that is easy to reach.  Do not use any sheets or blankets that are too big for your bed. They should not hang down onto the floor.  Have a firm chair that has side arms. You can use this for support while you get dressed.  Do not have throw rugs and other things on the floor that can make you trip. What can I do in the kitchen?  Clean up any spills right away.  Avoid walking on wet floors.  Keep items that you use a lot in easy-to-reach places.  If you need to reach something above you, use a strong step stool that has a grab bar.  Keep electrical cords out of the way.  Do not use floor  polish or wax that makes floors slippery. If you must use wax, use non-skid floor wax.  Do not have throw rugs and other things on the floor that can make you trip. What can I do with my stairs?  Do not leave any items on the stairs.  Make sure that there are handrails on both sides of the stairs and use them. Fix handrails that are broken or loose. Make sure that handrails are as long as the stairways.  Check any carpeting to make sure that it is firmly attached to the stairs. Fix any carpet that is loose or worn.  Avoid having throw rugs at the top or bottom of the stairs. If you do have throw rugs, attach them to the floor with carpet tape.  Make sure that you have a light switch at the top of the stairs and the bottom of the stairs. If you do not have them, ask someone to add them for you. What else can I do to help prevent falls?  Wear shoes that:  Do not have high heels.  Have rubber bottoms.  Are comfortable and fit you  well.  Are closed at the toe. Do not wear sandals.  If you use a stepladder:  Make sure that it is fully opened. Do not climb a closed stepladder.  Make sure that both sides of the stepladder are locked into place.  Ask someone to hold it for you, if possible.  Clearly mark and make sure that you can see:  Any grab bars or handrails.  First and last steps.  Where the edge of each step is.  Use tools that help you move around (mobility aids) if they are needed. These include:  Canes.  Walkers.  Scooters.  Crutches.  Turn on the lights when you go into a dark area. Replace any light bulbs as soon as they burn out.  Set up your furniture so you have a clear path. Avoid moving your furniture around.  If any of your floors are uneven, fix them.  If there are any pets around you, be aware of where they are.  Review your medicines with your doctor. Some medicines can make you feel dizzy. This can increase your chance of falling. Ask your doctor what other things that you can do to help prevent falls. This information is not intended to replace advice given to you by your health care provider. Make sure you discuss any questions you have with your health care provider. Document Released: 09/18/2009 Document Revised: 04/29/2016 Document Reviewed: 12/27/2014 Elsevier Interactive Patient Education  2017 [employer].

## 2019-10-11 ENCOUNTER — Ambulatory Visit (INDEPENDENT_AMBULATORY_CARE_PROVIDER_SITE_OTHER): Payer: Medicare Other | Admitting: Family Medicine

## 2019-10-11 ENCOUNTER — Encounter: Payer: Self-pay | Admitting: Family Medicine

## 2019-10-11 ENCOUNTER — Other Ambulatory Visit: Payer: Self-pay

## 2019-10-11 ENCOUNTER — Ambulatory Visit: Payer: Medicare Other

## 2019-10-11 VITALS — BP 138/80 | HR 59 | Temp 96.9°F | Resp 16 | Ht 60.0 in | Wt 212.6 lb

## 2019-10-11 DIAGNOSIS — Z0000 Encounter for general adult medical examination without abnormal findings: Secondary | ICD-10-CM | POA: Diagnosis not present

## 2019-10-11 DIAGNOSIS — Z1231 Encounter for screening mammogram for malignant neoplasm of breast: Secondary | ICD-10-CM

## 2019-10-11 NOTE — Patient Instructions (Signed)
 The CDC recommends two doses of Shingrix (the shingles vaccine) separated by 2 to 6 months for adults age 76 years and older. I recommend checking with your insurance plan regarding coverage for this vaccine.     Preventive Care 65 Years and Older, Female Preventive care refers to lifestyle choices and visits with your health care provider that can promote health and wellness. This includes:  A yearly physical exam. This is also called an annual well check.  Regular dental and eye exams.  Immunizations.  Screening for certain conditions.  Healthy lifestyle choices, such as diet and exercise. What can I expect for my preventive care visit? Physical exam Your health care provider will check:  Height and weight. These may be used to calculate body mass index (BMI), which is a measurement that tells if you are at a healthy weight.  Heart rate and blood pressure.  Your skin for abnormal spots. Counseling Your health care provider may ask you questions about:  Alcohol, tobacco, and drug use.  Emotional well-being.  Home and relationship well-being.  Sexual activity.  Eating habits.  History of falls.  Memory and ability to understand (cognition).  Work and work environment.  Pregnancy and menstrual history. What immunizations do I need?  Influenza (flu) vaccine  This is recommended every year. Tetanus, diphtheria, and pertussis (Tdap) vaccine  You may need a Td booster every 10 years. Varicella (chickenpox) vaccine  You may need this vaccine if you have not already been vaccinated. Zoster (shingles) vaccine  You may need this after age 60. Pneumococcal conjugate (PCV13) vaccine  One dose is recommended after age 65. Pneumococcal polysaccharide (PPSV23) vaccine  One dose is recommended after age 65. Measles, mumps, and rubella (MMR) vaccine  You may need at least one dose of MMR if you were born in 1957 or later. You may also need a second  dose. Meningococcal conjugate (MenACWY) vaccine  You may need this if you have certain conditions. Hepatitis A vaccine  You may need this if you have certain conditions or if you travel or work in places where you may be exposed to hepatitis A. Hepatitis B vaccine  You may need this if you have certain conditions or if you travel or work in places where you may be exposed to hepatitis B. Haemophilus influenzae type b (Hib) vaccine  You may need this if you have certain conditions. You may receive vaccines as individual doses or as more than one vaccine together in one shot (combination vaccines). Talk with your health care provider about the risks and benefits of combination vaccines. What tests do I need? Blood tests  Lipid and cholesterol levels. These may be checked every 5 years, or more frequently depending on your overall health.  Hepatitis C test.  Hepatitis B test. Screening  Lung cancer screening. You may have this screening every year starting at age 55 if you have a 30-pack-year history of smoking and currently smoke or have quit within the past 15 years.  Colorectal cancer screening. All adults should have this screening starting at age 76 and continuing until age 75. Your health care provider may recommend screening at age 45 if you are at increased risk. You will have tests every 1-10 years, depending on your results and the type of screening test.  Diabetes screening. This is done by checking your blood sugar (glucose) after you have not eaten for a while (fasting). You may have this done every 1-3 years.  Mammogram. This may   be done every 1-2 years. Talk with your health care provider about how often you should have regular mammograms.  BRCA-related cancer screening. This may be done if you have a family history of breast, ovarian, tubal, or peritoneal cancers. Other tests  Sexually transmitted disease (STD) testing.  Bone density scan. This is done to screen for  osteoporosis. You may have this done starting at age 65. Follow these instructions at home: Eating and drinking  Eat a diet that includes fresh fruits and vegetables, whole grains, lean protein, and low-fat dairy products. Limit your intake of foods with high amounts of sugar, saturated fats, and salt.  Take vitamin and mineral supplements as recommended by your health care provider.  Do not drink alcohol if your health care provider tells you not to drink.  If you drink alcohol: ? Limit how much you have to 0-1 drink a day. ? Be aware of how much alcohol is in your drink. In the [place] one drink equals one 12 oz bottle of beer (355 mL), one 5 oz glass of wine (148 mL), or one 1 oz glass of hard liquor (44 mL). Lifestyle  Take daily care of your teeth and gums.  Stay active. Exercise for at least 30 minutes on 5 or more days each week.  Do not use any products that contain nicotine or tobacco, such as cigarettes, e-cigarettes, and chewing tobacco. If you need help quitting, ask your health care provider.  If you are sexually active, practice safe sex. Use a condom or other form of protection in order to prevent STIs (sexually transmitted infections).  Talk with your health care provider about taking a low-dose aspirin or statin. What's next?  Go to your health care provider once a year for a well check visit.  Ask your health care provider how often you should have your eyes and teeth checked.  Stay up to date on all vaccines. This information is not intended to replace advice given to you by your health care provider. Make sure you discuss any questions you have with your health care provider. Document Released: 12/19/2015 Document Revised: 11/16/2018 Document Reviewed: 11/16/2018 Elsevier Patient Education  2020 [employer]  

## 2019-10-11 NOTE — Progress Notes (Signed)
Patient: Carrie Wheeler, Female    DOB: 08-25-1941, 78 y.o.   MRN: [id number] Visit Date: 10/11/2019  Today's Provider: [name], MD   Chief Complaint  Patient presents with  . Annual Exam   Subjective:     Complete Physical Carrie Wheeler is a 78 y.o. female. She feels fairly well. She reports exercising not regularly. She reports she is sleeping fairly well. 10/09/2019 AWV-with Mckenzie 10/05/2018 CPE 09/13/2017 Mammogram-BI-RADS 1 10/30/2014 BMD-Normal 11/27/2015 Colonoscopy-Polyps-Tubular Adenoma -----------------------------------------------------------   Review of Systems  Constitutional: Negative.   HENT: Negative.   Eyes: Negative.   Respiratory: Negative.   Cardiovascular: Negative.   Gastrointestinal: Negative.   Endocrine: Negative.   Musculoskeletal: Positive for arthralgias.  Skin: Negative.   Allergic/Immunologic: Negative.   Neurological: Negative.   Hematological: Negative.   Psychiatric/Behavioral: Negative.     Social History   Socioeconomic History  . Marital status: Single    Spouse name: Not on file  . Number of children: 0  . Years of education: HS  . Highest education level: High school graduate  Occupational History    Employer: [employer]    Comment: part-time  Social Needs  . Financial resource strain: Not hard at all  . Food insecurity    Worry: Never true    Inability: Never true  . Transportation needs    Medical: No    Non-medical: No  Tobacco Use  . Smoking status: Never Smoker  . Smokeless tobacco: Never Used  Substance and Sexual Activity  . 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 Drug use: No  . Sexual activity: Not Currently  Lifestyle  . Physical activity    Days per week: 0 days    Minutes per session: 0 min  . Stress: Not at all  Relationships  . Social Herbalist on phone: Patient refused    Gets together: Patient refused    Attends  religious service: Patient refused    Active member of club or organization: Patient refused    Attends meetings of clubs or organizations: Patient refused    Relationship status: Patient refused  . Intimate partner violence    Fear of current or ex partner: Patient refused    Emotionally abused: Patient refused    Physically abused: Patient refused    Forced sexual activity: Patient refused  Other Topics Concern  . Not on file  Social History Narrative   Had one child; a son who is now deceased due to liver disease.    Past Medical History:  Diagnosis Date  . Environmental allergies   . GERD (gastroesophageal reflux disease)    occasional  . Hyperlipidemia   . Hypertension      Patient Active Problem List   Diagnosis Date Noted  . GERD (gastroesophageal reflux disease) 10/05/2018  . Blurry vision 06/23/2018  . Benign neoplasm of ascending colon   . Benign neoplasm of transverse colon   . Essential (primary) hypertension 03/31/2015  . Morbid obesity ([place]) 03/31/2015  . Hyperlipidemia 01/02/2010  . Avitaminosis D 12/11/2008  . Family history of cancer of digestive system 10/31/2006  . Allergic rhinitis 12/20/2001    Past Surgical History:  Procedure Laterality Date  . COLONOSCOPY    . COLONOSCOPY WITH PROPOFOL N/A 11/27/2015   Procedure: COLONOSCOPY WITH PROPOFOL;  Surgeon: Lucilla Lame, MD;  Location: [facility];  Service: Endoscopy;  Laterality:  N/A;  . TONSILLECTOMY      Her family history includes Alzheimer's disease in an other family member; Colon cancer in her father and another family member; Diabetes in her mother and another family member; Healthy in her brother; Heart Problems in her brother; Hypertension in her father; Liver disease in her son.   Current Outpatient Medications:  .  amLODipine (NORVASC) 10 MG tablet, TAKE 1 TABLET(10 MG) BY MOUTH DAILY, Disp: 90 tablet, Rfl: 1 .  aspirin (ASPIRIN CHILDRENS) 81 MG chewable tablet, Chew 1 tablet (81  mg total) by mouth daily., Disp: 1 tablet, Rfl: 0 .  atorvastatin (LIPITOR) 40 MG tablet, TAKE 1 TABLET(40 MG) BY MOUTH DAILY, Disp: 90 tablet, Rfl: 3 .  Bioflavonoid Products (VITAMIN C) CHEW, Chew by mouth. Reported on 11/27/2015, Disp: , Rfl:  .  BIOTIN PO, Take 2 tablets by mouth daily. Unsure dose, Disp: , Rfl:  .  calcium carbonate (OS-CAL) 600 MG TABS tablet, Take 1 tablet by mouth daily., Disp: , Rfl:  .  cetirizine (ZYRTEC) 10 MG tablet, Take 1 tablet by mouth daily. As needed, Disp: , Rfl:  .  Cholecalciferol (VITAMIN D) 2000 UNITS CAPS, Take by mouth daily. , Disp: , Rfl:  .  fluticasone (FLONASE) 50 MCG/ACT nasal spray, SHAKE LIQUID AND USE 2 SPRAYS IN EACH NOSTRIL EVERY DAY, Disp: 16 g, Rfl: 11 .  Lactobacillus (PROBIOTIC ACIDOPHILUS PO), Take 1 tablet by mouth daily., Disp: , Rfl:  .  montelukast (SINGULAIR) 10 MG tablet, TAKE 1 TABLET BY MOUTH AT BEDTIME (Patient taking differently: As needed), Disp: 90 tablet, Rfl: 2 .  Multiple Vitamins-Minerals (OCUVITE ADULT 50+ PO), Take by mouth daily. , Disp: , Rfl:  .  OMEGA-3 FATTY ACIDS PO, Taking 1300 mg capsules- 3 capsules occasionally, Disp: , Rfl:   Patient Care Team: Virginia Crews, MD as PCP - General (Family Medicine) Judee Clara, DO as Referring Physician (Chiropractic Medicine)     Objective:    Vitals: BP 138/80 (BP Location: Left Arm, Patient Position: Sitting, Cuff Size: Normal)   Pulse (!) 59   Temp (!) 96.9 F (36.1 C) (Temporal)   Resp 16   Ht 5' (1.524 m)   Wt 212 lb 9.6 oz (96.4 kg)   BMI 41.52 kg/m   Physical Exam Vitals signs reviewed.  Constitutional:      General: She is not in acute distress.    Appearance: Normal appearance. She is well-developed. She is not diaphoretic.  HENT:     Head: Normocephalic and atraumatic.     Right Ear: Tympanic membrane, ear canal and external ear normal.     Left Ear: Tympanic membrane, ear canal and external ear normal.  Eyes:     General: No scleral  icterus.    Conjunctiva/sclera: Conjunctivae normal.     Pupils: Pupils are equal, round, and reactive to light.  Neck:     Musculoskeletal: Neck supple.     Thyroid: No thyromegaly.  Cardiovascular:     Rate and Rhythm: Normal rate and regular rhythm.     Pulses: Normal pulses.     Heart sounds: Normal heart sounds. No murmur.  Pulmonary:     Effort: Pulmonary effort is normal. No respiratory distress.     Breath sounds: Normal breath sounds. No wheezing or rales.  Abdominal:     General: There is no distension.     Palpations: Abdomen is soft.     Tenderness: There is no abdominal tenderness.  Musculoskeletal:  General: No deformity.     Right lower leg: No edema.     Left lower leg: No edema.  Lymphadenopathy:     Cervical: No cervical adenopathy.  Skin:    General: Skin is warm and dry.     Capillary Refill: Capillary refill takes less than 2 seconds.     Findings: No rash.  Neurological:     Mental Status: She is alert and oriented to person, place, and time. Mental status is at baseline.  Psychiatric:        Mood and Affect: Mood normal.        Behavior: Behavior normal.        Thought Content: Thought content normal.     Activities of Daily Living In your present state of health, do you have any difficulty performing the following activities: 10/09/2019  Hearing? N  Vision? N  Difficulty concentrating or making decisions? N  Walking or climbing stairs? N  Dressing or bathing? N  Doing errands, shopping? N  Preparing Food and eating ? N  Using the Toilet? N  In the past six months, have you accidently leaked urine? Y  Comment Often- wears protection daily.  Do you have problems with loss of bowel control? N  Managing your Medications? N  Managing your Finances? N  Housekeeping or managing your Housekeeping? N  Some recent data might be hidden    Fall Risk Assessment Fall Risk  10/09/2019 10/09/2019 09/18/2018 12/14/2017 09/12/2017  Falls in the past  year? 1 1 No No No  Number falls in past yr: 0 0 - - -  Injury with Fall? 0 0 - - -  Follow up - Falls prevention discussed - - -     Depression Screen PHQ 2/9 Scores 10/09/2019 09/18/2018 12/14/2017 09/12/2017  PHQ - 2 Score 0 0 0 0    No flowsheet data found.     Assessment & Plan:    Annual Physical Reviewed patient's Family Medical History Reviewed and updated list of patient's medical providers Assessment of cognitive impairment was done Assessed patient's functional ability Established a written schedule for health screening [place] Completed and Reviewed  Exercise Activities and Dietary recommendations Goals    . Exercise 3x per week (30 min per time)     Recommend to start some form of exercise 3 times a week for at least 30 minutes.          Immunization History  Administered Date(s) Administered  . Influenza, High Dose Seasonal PF 09/12/2017, 09/18/2018, 10/03/2019  . Pneumococcal Conjugate-13 05/19/2015  . Pneumococcal Polysaccharide-23 04/02/2011  . Td 06/01/2006, 08/30/2016  . Tdap 06/01/2006    Health Maintenance  Topic Date Due  . COLONOSCOPY  11/26/2020  . TETANUS/TDAP  08/30/2026  . INFLUENZA VACCINE  Completed  . DEXA SCAN  Completed  . PNA vac Low Risk Adult  Completed     Discussed health benefits of physical activity, and encouraged her to engage in regular exercise appropriate for her age and condition.    ------------------------------------------------------------------------------------------------------------  Problem List Items Addressed This Visit      Other   Morbid obesity ([place])    Associated with HTN, HLD, GERD, OA Discussed importance of healthy weight management Discussed diet and exercise        Other Visit Diagnoses    Encounter for annual physical exam    -  Primary   Encounter for screening mammogram for malignant neoplasm of breast  Relevant Orders   MM 3D SCREEN BREAST BILATERAL        Return in about 6 months (around 04/09/2020) for chronic disease f/u.   The entirety of the information documented in the History of Present Illness, Review of Systems and Physical Exam were personally obtained by me. Portions of this information were initially documented by Lynford Humphrey , CMA and reviewed by me for thoroughness and accuracy.    B[place], Dionne Bucy, MD MPH [facility]

## 2019-10-11 NOTE — Assessment & Plan Note (Signed)
Associated with HTN, HLD, GERD, OA Discussed importance of healthy weight management Discussed diet and exercise

## 2019-11-08 ENCOUNTER — Other Ambulatory Visit: Payer: Self-pay | Admitting: Family Medicine

## 2019-11-08 MED ORDER — ATORVASTATIN CALCIUM 40 MG PO TABS: 40.0000 mg | ORAL_TABLET | Freq: Every day | ORAL | 1 refills | Status: DC

## 2019-11-08 NOTE — Telephone Encounter (Signed)
[facility] faxed refill request for the following medications:  atorvastatin (LIPITOR) 40 MG tablet   Please advise.  

## 2020-02-06 ENCOUNTER — Other Ambulatory Visit: Payer: Self-pay | Admitting: Family Medicine

## 2020-02-06 DIAGNOSIS — I10 Essential (primary) hypertension: Secondary | ICD-10-CM

## 2020-02-06 DIAGNOSIS — J302 Other seasonal allergic rhinitis: Secondary | ICD-10-CM

## 2020-02-06 MED ORDER — AMLODIPINE BESYLATE 10 MG PO TABS: ORAL_TABLET | ORAL | 1 refills | Status: DC

## 2020-02-06 MED ORDER — MONTELUKAST SODIUM 10 MG PO TABS: 10.0000 mg | ORAL_TABLET | Freq: Every day | ORAL | 2 refills | Status: DC

## 2020-02-06 NOTE — Telephone Encounter (Signed)
[facility] faxed refill request for the following medications:  amLODipine (NORVASC) 10 MG tablet montelukast (SINGULAIR) 10 MG tablet  Please advise.  Thanks, [employer]

## 2020-04-09 NOTE — Progress Notes (Signed)
Established patient visit   Patient: Carrie Wheeler   DOB: 1941/01/21   79 y.o. Female  MRN: [id number] Visit Date: 04/10/2020  I,[name],acting as a scribe for [name], MD.,have documented all relevant documentation on the behalf of [name], MD,as directed by  [name], MD while in the presence of [name], MD.  Today's healthcare provider: [name], MD   Chief Complaint  Patient presents with  . Hypertension  . Hypothyroidism  . Foot Pain   Subjective    Subjective:  Carrie Wheeler is a 79 y.o. female who presents with bilateral foot pain. Onset of the symptoms was 4 week ago. Precipitating event: none known. Current symptoms include: ability to bear weight, but with some pain. Aggravating factors: standing and walking. Symptoms have stabilized. Patient has had no prior foot problems. Evaluation to date: none. Treatment to date: none.  Hypertension, follow-up  BP Readings from Last 3 Encounters:  04/10/20 116/76  10/11/19 138/80  06/18/19 138/81   Wt Readings from Last 3 Encounters:  04/10/20 213 lb 9.6 oz (96.9 kg)  10/11/19 212 lb 9.6 oz (96.4 kg)  06/18/19 210 lb (95.3 kg)     She was last seen for hypertension 6 months ago.  BP at that visit was 138/81. Management since that visit includes no changes.  She reports excellent compliance with treatment. She is not having side effects.  She is following a Low Sodium diet. She is not exercising. She does not smoke.  Use of agents associated with hypertension: none.   Outside blood pressures are not being checked. Symptoms: No chest pain No chest pressure No palpitations No dyspnea No orthopnea No paroxysmal nocturnal dyspnea Yes lower extremity edema No syncope   Pertinent labs: Lab Results  Component Value Date   CHOL 133 06/18/2019   HDL 51 06/18/2019   LDLCALC 64 06/18/2019   TRIG 88 06/18/2019   CHOLHDL 2.6 06/18/2019   Lab Results    Component Value Date   NA 145 (H) 06/18/2019   K 5.2 06/18/2019   CO2 24 06/18/2019   GLUCOSE 96 06/18/2019   BUN 15 06/18/2019   CREATININE 0.94 06/18/2019   CALCIUM 9.4 06/18/2019   GFRNONAA 60 06/18/2019   GFRAA 69 06/18/2019     The 10-year ASCVD risk score Mikey Bussing DC Jr., et al., 2013) is: 19.1%   ---------------------------------------------------------------------------------------------------  Lipid/Cholesterol, Follow-up  Last lipid panel Other pertinent labs  Lab Results  Component Value Date   CHOL 133 06/18/2019   HDL 51 06/18/2019   LDLCALC 64 06/18/2019   TRIG 88 06/18/2019   CHOLHDL 2.6 06/18/2019   Lab Results  Component Value Date   ALT 14 06/18/2019   AST 16 06/18/2019   PLT 256 10/05/2018   TSH 2.590 08/30/2016     She was last seen for this 6 months ago.  Management since that visit includes no changes.  She reports excellent compliance with treatment. She is not having side effects.  Symptoms: No chest pain No chest pressure/discomfort No dyspnea No lower extremity edema No numbness or tingling of extremity No orthopnea No palpitations No paroxysmal nocturnal dyspnea No speech difficulty No syncope  Current diet: in general, a "healthy" diet   Current exercise: yard work  [facility] Readings from Last 3 Encounters:  04/10/20 213 lb 9.6 oz (96.9 kg)  10/11/19 212 lb 9.6 oz (96.4 kg)  06/18/19 210 lb (95.3 kg)   The 10-year ASCVD risk score Mikey Bussing  DC Brooke Bonito., et al., 2013) is: 19.1%  -----------------------------------------------------------------------------------------   Patient Active Problem List   Diagnosis Date Noted  . Neuropathy 04/10/2020  . GERD (gastroesophageal reflux disease) 10/05/2018  . Blurry vision 06/23/2018  . Benign neoplasm of ascending colon   . Benign neoplasm of transverse colon   . Essential (primary) hypertension 03/31/2015  . Morbid obesity ([place]) 03/31/2015  . Hyperlipidemia 01/02/2010  . Avitaminosis D  12/11/2008  . Family history of cancer of digestive system 10/31/2006  . Allergic rhinitis 12/20/2001   Social History   Tobacco Use  . Smoking status: Never Smoker  . Smokeless tobacco: Never Used  Substance Use Topics  . 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 Drug use: No       Medications: Outpatient Medications Prior to Visit  Medication Sig  . amLODipine (NORVASC) 10 MG tablet TAKE 1 TABLET(10 MG) BY MOUTH DAILY  . aspirin (ASPIRIN CHILDRENS) 81 MG chewable tablet Chew 1 tablet (81 mg total) by mouth daily.  Marland Kitchen atorvastatin (LIPITOR) 40 MG tablet Take 1 tablet (40 mg total) by mouth daily.  Marland Kitchen BIOTIN PO Take 2 tablets by mouth daily. Unsure dose  . calcium carbonate (OS-CAL) 600 MG TABS tablet Take 1 tablet by mouth daily.  . Cholecalciferol (VITAMIN D) 2000 UNITS CAPS Take by mouth daily.   . fluticasone (FLONASE) 50 MCG/ACT nasal spray SHAKE LIQUID AND USE 2 SPRAYS IN EACH NOSTRIL EVERY DAY  . Lactobacillus (PROBIOTIC ACIDOPHILUS PO) Take 1 tablet by mouth daily.  . cetirizine (ZYRTEC) 10 MG tablet Take 1 tablet by mouth daily. As needed  . [DISCONTINUED] Bioflavonoid Products (VITAMIN C) CHEW Chew by mouth. Reported on 11/27/2015  . [DISCONTINUED] montelukast (SINGULAIR) 10 MG tablet Take 1 tablet (10 mg total) by mouth at bedtime.  . [DISCONTINUED] Multiple Vitamins-Minerals (OCUVITE ADULT 50+ PO) Take by mouth daily.   . [DISCONTINUED] OMEGA-3 FATTY ACIDS PO Taking 1300 mg capsules- 3 capsules occasionally   No facility-administered medications prior to visit.    Review of Systems  Constitutional: Negative.   Respiratory: Negative.   Cardiovascular: Negative.   Neurological: Negative.   Psychiatric/Behavioral: Negative.     Last metabolic panel Lab Results  Component Value Date   GLUCOSE 96 06/18/2019   NA 145 (H) 06/18/2019   K 5.2 06/18/2019   CL 107 (H) 06/18/2019   CO2 24 06/18/2019   BUN 15 06/18/2019    CREATININE 0.94 06/18/2019   GFRNONAA 60 06/18/2019   GFRAA 69 06/18/2019   CALCIUM 9.4 06/18/2019   PROT 6.4 06/18/2019   ALBUMIN 4.2 06/18/2019   LABGLOB 2.2 06/18/2019   AGRATIO 1.9 06/18/2019   BILITOT 0.5 06/18/2019   ALKPHOS 114 06/18/2019   AST 16 06/18/2019   ALT 14 06/18/2019   ANIONGAP 6 11/17/2015   Last lipids Lab Results  Component Value Date   CHOL 133 06/18/2019   HDL 51 06/18/2019   LDLCALC 64 06/18/2019   TRIG 88 06/18/2019   CHOLHDL 2.6 06/18/2019      Objective    BP 116/76 (BP Location: Right Arm, Patient Position: Sitting, Cuff Size: Large)   Pulse 74   Temp (!) 97.5 F (36.4 C) (Temporal)   Resp 16   Ht 4\' 11"  (1.499 m)   Wt 213 lb 9.6 oz (96.9 kg)   BMI 43.14 kg/m  BP Readings from Last 3 Encounters:  04/10/20 116/76  10/11/19 138/80  06/18/19  138/81   Wt Readings from Last 3 Encounters:  04/10/20 213 lb 9.6 oz (96.9 kg)  10/11/19 212 lb 9.6 oz (96.4 kg)  06/18/19 210 lb (95.3 kg)      Physical Exam Vitals reviewed.  Constitutional:      General: She is not in acute distress.    Appearance: Normal appearance. She is well-developed. She is not diaphoretic.  HENT:     Head: Normocephalic and atraumatic.  Eyes:     General: No scleral icterus.    Conjunctiva/sclera: Conjunctivae normal.  Neck:     Thyroid: No thyromegaly.  Cardiovascular:     Rate and Rhythm: Normal rate and regular rhythm.     Pulses: Normal pulses.     Heart sounds: Normal heart sounds. No murmur.  Pulmonary:     Effort: Pulmonary effort is normal. No respiratory distress.     Breath sounds: Normal breath sounds. No wheezing, rhonchi or rales.  Musculoskeletal:     Cervical back: Neck supple.     Right lower leg: No edema.     Left lower leg: No edema.  Feet:     Right foot:     Skin integrity: Skin integrity normal.     Left foot:     Skin integrity: Skin integrity normal.     Comments:     Lymphadenopathy:     Cervical: No cervical adenopathy.    Skin:    General: Skin is warm and dry.     Findings: No rash.  Neurological:     Mental Status: She is alert and oriented to person, place, and time. Mental status is at baseline.  Psychiatric:        Mood and Affect: Mood normal.        Behavior: Behavior normal.     No results found for any visits on 04/10/20.  Assessment & Plan     Problem List Items Addressed This Visit      Cardiovascular and Mediastinum   Essential (primary) hypertension - Primary    Well controlled Continue current medications Recheck metabolic panel F/u in 6 months with CPE       Relevant Orders   Comprehensive metabolic panel     Nervous and Auditory   Neuropathy    New problem Check lab as below Patient to call back about starting Gabapentin       Relevant Orders   Vitamin B12     Other   Hyperlipidemia    Previously well controlled Continue statin Repeat FLP and CMP Goal LDL < 100       Relevant Orders   Lipid Panel With LDL/HDL Ratio   Morbid obesity ([place])    Discussed importance of healthy weight management Discussed diet and exercise       Avitaminosis D    Will check labs      Relevant Orders   VITAMIN D 25 Hydroxy (Vit-D Deficiency, Fractures)       Return in about 6 months (around 10/11/2020) for CPE, chronic disease f/u.       I, [name], MD, have reviewed all documentation for this visit. The documentation on 04/10/20 for the exam, diagnosis, procedures, and orders are all accurate and complete.   Dianara Smullen, Dionne Bucy, MD, MPH [facility]

## 2020-04-10 ENCOUNTER — Other Ambulatory Visit: Payer: Self-pay

## 2020-04-10 ENCOUNTER — Encounter: Payer: Self-pay | Admitting: Family Medicine

## 2020-04-10 ENCOUNTER — Ambulatory Visit (INDEPENDENT_AMBULATORY_CARE_PROVIDER_SITE_OTHER): Payer: Medicare Other | Admitting: Family Medicine

## 2020-04-10 VITALS — BP 116/76 | HR 74 | Temp 97.5°F | Resp 16 | Ht 59.0 in | Wt 213.6 lb

## 2020-04-10 DIAGNOSIS — E559 Vitamin D deficiency, unspecified: Secondary | ICD-10-CM | POA: Diagnosis not present

## 2020-04-10 DIAGNOSIS — G629 Polyneuropathy, unspecified: Secondary | ICD-10-CM

## 2020-04-10 DIAGNOSIS — E7800 Pure hypercholesterolemia, unspecified: Secondary | ICD-10-CM | POA: Diagnosis not present

## 2020-04-10 DIAGNOSIS — I10 Essential (primary) hypertension: Secondary | ICD-10-CM

## 2020-04-10 DIAGNOSIS — R200 Anesthesia of skin: Secondary | ICD-10-CM | POA: Diagnosis not present

## 2020-04-10 NOTE — Assessment & Plan Note (Addendum)
Well controlled Continue current medications Recheck metabolic panel F/u in 6 months with CPE

## 2020-04-10 NOTE — Assessment & Plan Note (Addendum)
Previously well controlled Continue statin Repeat FLP and CMP Goal LDL < 100 

## 2020-04-10 NOTE — Assessment & Plan Note (Signed)
Discussed importance of healthy weight management Discussed diet and exercise  

## 2020-04-10 NOTE — Assessment & Plan Note (Signed)
Will check labs

## 2020-04-10 NOTE — Assessment & Plan Note (Signed)
New problem Check lab as below Patient to call back about starting Gabapentin

## 2020-04-10 NOTE — Patient Instructions (Addendum)
Gabapentin for nerve pain of feet. Please call if you would like to start medication before your next visit.  Hypertension, Adult High blood pressure (hypertension) is when the force of blood pumping through the arteries is too strong. The arteries are the blood vessels that carry blood from the heart throughout the body. Hypertension forces the heart to work harder to pump blood and may cause arteries to become narrow or stiff. Untreated or uncontrolled hypertension can cause a heart attack, heart failure, a stroke, kidney disease, and other problems. A blood pressure reading consists of a higher number over a lower number. Ideally, your blood pressure should be below 120/80. The first ("top") number is called the systolic pressure. It is a measure of the pressure in your arteries as your heart beats. The second ("bottom") number is called the diastolic pressure. It is a measure of the pressure in your arteries as the heart relaxes. What are the causes? The exact cause of this condition is not known. There are some conditions that result in or are related to high blood pressure. What increases the risk? Some risk factors for high blood pressure are under your control. The following factors may make you more likely to develop this condition:  Smoking.  Having type 2 diabetes mellitus, high cholesterol, or both.  Not getting enough exercise or physical activity.  Being overweight.  Having too much fat, sugar, calories, or salt (sodium) in your diet.  Drinking too much alcohol. Some risk factors for high blood pressure may be difficult or impossible to change. Some of these factors include:  Having chronic kidney disease.  Having a family history of high blood pressure.  Age. Risk increases with age.  Race. You may be at higher risk if you are African American.  Gender. Men are at higher risk than women before age 37. After age 41, women are at higher risk than men.  Having obstructive  sleep apnea.  Stress. What are the signs or symptoms? High blood pressure may not cause symptoms. Very high blood pressure (hypertensive crisis) may cause:  Headache.  Anxiety.  Shortness of breath.  Nosebleed.  Nausea and vomiting.  Vision changes.  Severe chest pain.  Seizures. How is this diagnosed? This condition is diagnosed by measuring your blood pressure while you are seated, with your arm resting on a flat surface, your legs uncrossed, and your feet flat on the floor. The cuff of the blood pressure monitor will be placed directly against the skin of your upper arm at the level of your heart. It should be measured at least twice using the same arm. Certain conditions can cause a difference in blood pressure between your right and left arms. Certain factors can cause blood pressure readings to be lower or higher than normal for a short period of time:  When your blood pressure is higher when you are in a health care provider's office than when you are at home, this is called white coat hypertension. Most people with this condition do not need medicines.  When your blood pressure is higher at home than when you are in a health care provider's office, this is called masked hypertension. Most people with this condition may need medicines to control blood pressure. If you have a high blood pressure reading during one visit or you have normal blood pressure with other risk factors, you may be asked to:  Return on a different day to have your blood pressure checked again.  Monitor your blood  pressure at home for 1 week or longer. If you are diagnosed with hypertension, you may have other blood or imaging tests to help your health care provider understand your overall risk for other conditions. How is this treated? This condition is treated by making healthy lifestyle changes, such as eating healthy foods, exercising more, and reducing your alcohol intake. Your health care provider  may prescribe medicine if lifestyle changes are not enough to get your blood pressure under control, and if:  Your systolic blood pressure is above 130.  Your diastolic blood pressure is above 80. Your personal target blood pressure may vary depending on your medical conditions, your age, and other factors. Follow these instructions at home: Eating and drinking   Eat a diet that is high in fiber and potassium, and low in sodium, added sugar, and fat. An example eating plan is called the DASH (Dietary Approaches to Stop Hypertension) diet. To eat this way: ? Eat plenty of fresh fruits and vegetables. Try to fill one half of your plate at each meal with fruits and vegetables. ? Eat whole grains, such as whole-wheat pasta, brown rice, or whole-grain bread. Fill about one fourth of your plate with whole grains. ? Eat or drink low-fat dairy products, such as skim milk or low-fat yogurt. ? Avoid fatty cuts of meat, processed or cured meats, and poultry with skin. Fill about one fourth of your plate with lean proteins, such as fish, chicken without skin, beans, eggs, or tofu. ? Avoid pre-made and processed foods. These tend to be higher in sodium, added sugar, and fat.  Reduce your daily sodium intake. Most people with hypertension should eat less than 1,500 mg of sodium a day.  Do not drink alcohol if: ? Your health care provider tells you not to drink. ? You are pregnant, may be pregnant, or are planning to become pregnant.  If you drink alcohol: ? Limit how much you use to:  0-1 drink a day for women.  0-2 drinks a day for men. ? Be aware of how much alcohol is in your drink. In the [place] one drink equals one 12 oz bottle of beer (355 mL), one 5 oz glass of wine (148 mL), or one 1 oz glass of hard liquor (44 mL). Lifestyle   Work with your health care provider to maintain a healthy body weight or to lose weight. Ask what an ideal weight is for you.  Get at least 30 minutes of exercise  most days of the week. Activities may include walking, swimming, or biking.  Include exercise to strengthen your muscles (resistance exercise), such as Pilates or lifting weights, as part of your weekly exercise routine. Try to do these types of exercises for 30 minutes at least 3 days a week.  Do not use any products that contain nicotine or tobacco, such as cigarettes, e-cigarettes, and chewing tobacco. If you need help quitting, ask your health care provider.  Monitor your blood pressure at home as told by your health care provider.  Keep all follow-up visits as told by your health care provider. This is important. Medicines  Take over-the-counter and prescription medicines only as told by your health care provider. Follow directions carefully. Blood pressure medicines must be taken as prescribed.  Do not skip doses of blood pressure medicine. Doing this puts you at risk for problems and can make the medicine less effective.  Ask your health care provider about side effects or reactions to medicines that you should  watch for. Contact a health care provider if you:  Think you are having a reaction to a medicine you are taking.  Have headaches that keep coming back (recurring).  Feel dizzy.  Have swelling in your ankles.  Have trouble with your vision. Get help right away if you:  Develop a severe headache or confusion.  Have unusual weakness or numbness.  Feel faint.  Have severe pain in your chest or abdomen.  Vomit repeatedly.  Have trouble breathing. Summary  Hypertension is when the force of blood pumping through your arteries is too strong. If this condition is not controlled, it may put you at risk for serious complications.  Your personal target blood pressure may vary depending on your medical conditions, your age, and other factors. For most people, a normal blood pressure is less than 120/80.  Hypertension is treated with lifestyle changes, medicines, or a  combination of both. Lifestyle changes include losing weight, eating a healthy, low-sodium diet, exercising more, and limiting alcohol. This information is not intended to replace advice given to you by your health care provider. Make sure you discuss any questions you have with your health care provider. Document Revised: 08/02/2018 Document Reviewed: 08/02/2018 Elsevier Patient Education  2020 [employer].

## 2020-04-11 ENCOUNTER — Telehealth: Payer: Self-pay

## 2020-04-11 LAB — VITAMIN B12: Vitamin B-12: 398 pg/mL (ref 232–1245)

## 2020-04-11 LAB — COMPREHENSIVE METABOLIC PANEL
ALT: 12 IU/L (ref 0–32)
AST: 14 IU/L (ref 0–40)
Albumin/Globulin Ratio: 1.9 (ref 1.2–2.2)
Albumin: 4.3 g/dL (ref 3.7–4.7)
Alkaline Phosphatase: 134 IU/L — ABNORMAL HIGH (ref 39–117)
BUN/Creatinine Ratio: 20 (ref 12–28)
BUN: 18 mg/dL (ref 8–27)
Bilirubin Total: 0.7 mg/dL (ref 0.0–1.2)
CO2: 25 mmol/L (ref 20–29)
Calcium: 9.7 mg/dL (ref 8.7–10.3)
Chloride: 102 mmol/L (ref 96–106)
Creatinine, Ser: 0.89 mg/dL (ref 0.57–1.00)
GFR calc Af Amer: 73 mL/min/{1.73_m2} (ref >59)
GFR calc non Af Amer: 63 mL/min/{1.73_m2} (ref >59)
Globulin, Total: 2.3 g/dL (ref 1.5–4.5)
Glucose: 95 mg/dL (ref 65–99)
Potassium: 4.5 mmol/L (ref 3.5–5.2)
Sodium: 141 mmol/L (ref 134–144)
Total Protein: 6.6 g/dL (ref 6.0–8.5)

## 2020-04-11 LAB — LIPID PANEL WITH LDL/HDL RATIO
Cholesterol, Total: 134 mg/dL (ref 100–199)
HDL: 53 mg/dL (ref >39)
LDL Chol Calc (NIH): 66 mg/dL (ref 0–99)
LDL/HDL Ratio: 1.2 ratio (ref 0.0–3.2)
Triglycerides: 74 mg/dL (ref 0–149)
VLDL Cholesterol Cal: 15 mg/dL (ref 5–40)

## 2020-04-11 LAB — VITAMIN D 25 HYDROXY (VIT D DEFICIENCY, FRACTURES): Vit D, 25-Hydroxy: 33.8 ng/mL (ref 30.0–100.0)

## 2020-04-11 NOTE — Telephone Encounter (Signed)
Patient advised.

## 2020-04-11 NOTE — Telephone Encounter (Signed)
-----   Message from Virginia Crews, MD sent at 04/11/2020 10:36 AM EDT ----- Normal labs

## 2020-05-06 ENCOUNTER — Other Ambulatory Visit: Payer: Self-pay | Admitting: Family Medicine

## 2020-05-06 DIAGNOSIS — J302 Other seasonal allergic rhinitis: Secondary | ICD-10-CM

## 2020-05-06 NOTE — Telephone Encounter (Signed)
[facility] faxed refill request for the following medications:  atorvastatin (LIPITOR) 40 MG tablet  90 DAY SUPPLY  Last Rx: 11/08/19 LOV: 04/10/2020 Please advise. Thanks TNP

## 2020-05-06 NOTE — Telephone Encounter (Signed)
      [facility] faxed refill request for the following medications:  1. atorvastatin (LIPITOR) 40 MG tablet   2. fluticasone (FLONASE) 50 MCG/ACT nasal spray  90 DAY SUPPLY  LOV: 04/10/2020 Please advise. Thanks TNP

## 2020-05-07 MED ORDER — FLUTICASONE PROPIONATE 50 MCG/ACT NA SUSP: NASAL | 11 refills | Status: DC

## 2020-05-07 MED ORDER — ATORVASTATIN CALCIUM 40 MG PO TABS: 40.0000 mg | ORAL_TABLET | Freq: Every day | ORAL | 1 refills | Status: DC

## 2020-09-24 ENCOUNTER — Other Ambulatory Visit: Payer: Self-pay | Admitting: Family Medicine

## 2020-09-24 DIAGNOSIS — I10 Essential (primary) hypertension: Secondary | ICD-10-CM

## 2020-09-24 MED ORDER — AMLODIPINE BESYLATE 10 MG PO TABS: ORAL_TABLET | ORAL | 1 refills | Status: DC

## 2020-09-24 NOTE — Telephone Encounter (Signed)
[facility] faxed refill request for the following medications: ° °amLODipine (NORVASC) 10 MG tablet  ° ° °Please advise. °

## 2020-10-13 NOTE — Progress Notes (Signed)
Subjective:   Carrie Wheeler is a 79 y.o. female who presents for Medicare Annual (Subsequent) preventive examination.  Review of Systems    N/A  Cardiac Risk Factors include: advanced age (>52men, >30 women);dyslipidemia;hypertension;obesity (BMI >30kg/m2)     Objective:    Today's Vitals   10/14/20 0819  BP: 138/68  Pulse: 74  Temp: 97.7 F (36.5 C)  TempSrc: Oral  SpO2: 95%  Weight: 199 lb 6.4 oz (90.4 kg)  Height: 4\' 11"  (1.499 m)  PainSc: 0-No pain   Body mass index is 40.27 kg/m.  Advanced Directives 10/14/2020 10/09/2019 09/18/2018 09/12/2017 11/27/2015 11/17/2015 09/19/2015  Does Patient Have a Medical Advance Directive? Yes Yes Yes Yes Yes No Yes  Type of Paramedic of [place];Living will [place];Living will [place];Living will [place];Living will [place];Living will - Living will;Healthcare Power of Attorney  Does patient want to make changes to medical advance directive? - - - - No - Patient declined - -  Copy of [place] in Chart? Yes - validated most recent copy scanned in chart (See row information) No - copy requested No - copy requested - No - copy requested - -  Would patient like information on creating a medical advance directive? - - - - - Yes - Educational materials given -    Current Medications (verified) Outpatient Encounter Medications as of 10/14/2020  Medication Sig  . amLODipine (NORVASC) 10 MG tablet TAKE 1 TABLET(10 MG) BY MOUTH DAILY  . atorvastatin (LIPITOR) 40 MG tablet Take 1 tablet (40 mg total) by mouth daily.  Marland Kitchen BIOTIN PO Take 2 tablets by mouth daily. Unsure dose  . calcium carbonate (OS-CAL) 600 MG TABS tablet Take 1 tablet by mouth daily.  . cetirizine (ZYRTEC) 10 MG tablet Take 1 tablet by mouth daily. As needed  . Cholecalciferol (VITAMIN D) 2000 UNITS CAPS Take by mouth daily.   . fluticasone (FLONASE)  50 MCG/ACT nasal spray SHAKE LIQUID AND USE 2 SPRAYS IN EACH NOSTRIL EVERY DAY  . Lactobacillus (PROBIOTIC ACIDOPHILUS PO) Take 1 tablet by mouth daily.  . Tetrahydrozoline HCl (VISINE RED EYE COMFORT OP) Place 1 drop into both eyes daily.  Marland Kitchen aspirin (ASPIRIN CHILDRENS) 81 MG chewable tablet Chew 1 tablet (81 mg total) by mouth daily. (Patient not taking: Reported on 10/14/2020)   No facility-administered encounter medications on file as of 10/14/2020.    Allergies (verified) Patient has no known allergies.   History: Past Medical History:  Diagnosis Date  . Environmental allergies   . GERD (gastroesophageal reflux disease)    occasional  . Hyperlipidemia   . Hypertension    Past Surgical History:  Procedure Laterality Date  . COLONOSCOPY    . COLONOSCOPY WITH PROPOFOL N/A 11/27/2015   Procedure: COLONOSCOPY WITH PROPOFOL;  Surgeon: Lucilla Lame, MD;  Location: [facility];  Service: Endoscopy;  Laterality: N/A;  . TONSILLECTOMY     Family History  Problem Relation Age of Onset  . Hypertension Father   . Colon cancer Father   . Alzheimer's disease Other   . Diabetes Other   . Colon cancer Other   . Liver disease Son   . Diabetes Mother   . Heart Problems Brother        Congential heart defect  . Healthy Brother    Social History   Socioeconomic History  . Marital status: Widowed    Spouse name: Not on  file  . Number of children: 0  . Years of education: HS  . Highest education level: High school graduate  Occupational History    Employer: [employer]    Comment: part-time  Tobacco Use  . Smoking status: Never Smoker  . Smokeless tobacco: Never Used  Vaping Use  . Vaping Use: Never used  Substance and Sexual Activity  . 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 Drug use: No  . Sexual activity: Not Currently  Other Topics Concern  . Not on file  Social History Narrative   Had one child; a son who is now  deceased due to liver disease.   Social Determinants of Health   Financial Resource Strain: Low Risk   . Difficulty of Paying Living Expenses: Not hard at all  Food Insecurity: No Food Insecurity  . Worried About Charity fundraiser in the Last Year: Never true  . Ran Out of Food in the Last Year: Never true  Transportation Needs: No Transportation Needs  . Lack of Transportation (Medical): No  . Lack of Transportation (Non-Medical): No  Physical Activity: Inactive  . Days of Exercise per Week: 0 days  . Minutes of Exercise per Session: 0 min  Stress: No Stress Concern Present  . Feeling of Stress : Not at all  Social Connections: Moderately Isolated  . Frequency of Communication with Friends and Family: More than three times a week  . Frequency of Social Gatherings with Friends and Family: More than three times a week  . Attends Religious Services: More than 4 times per year  . Active Member of Clubs or Organizations: No  . Attends Archivist Meetings: Never  . Marital Status: Widowed    Tobacco Counseling Counseling given: Not Answered   Clinical Intake:  Pre-visit preparation completed: Yes  Pain : No/denies pain Pain Score: 0-No pain     Nutritional Status: BMI > 30  Obese Nutritional Risks: None Diabetes: No  How often do you need to have someone help you when you read instructions, pamphlets, or other written materials from your doctor or pharmacy?: 1 - Never  Diabetic? No  Interpreter Needed?: No  Information entered by :: [facility], LPN   Activities of Daily Living In your present state of health, do you have any difficulty performing the following activities: 10/14/2020  Hearing? N  Vision? N  Difficulty concentrating or making decisions? N  Walking or climbing stairs? Y  Comment Due to back pain.  Dressing or bathing? N  Doing errands, shopping? N  Preparing Food and eating ? N  Using the Toilet? N  In the past six months, have you  accidently leaked urine? Y  Comment Often, wears protection at all times.  Do you have problems with loss of bowel control? N  Managing your Medications? N  Managing your Finances? N  Housekeeping or managing your Housekeeping? N  Some recent data might be hidden    Patient Care Team: Virginia Crews, MD as PCP - General (Family Medicine) Judee Clara, DO as Referring Physician (Chiropractic Medicine) Dingeldein, Remo Lipps, MD (Ophthalmology)  Indicate any recent Medical Services you may have received from other than [facility] providers in the past year (date may be approximate).     Assessment:   This is a routine wellness examination for Lella.  Hearing/Vision screen No exam data present  Dietary issues and exercise activities discussed: Current Exercise  Habits: Home exercise routine, Type of exercise: stretching, Time (Minutes): 30, Frequency (Times/Week): 3, Weekly Exercise (Minutes/Week): 90, Intensity: Mild, Exercise limited by: orthopedic condition(s)  Goals    . Weight (lb) < 175 lb (79.4 kg)     Recommend to continue current diet plans of eating healthier options and avoiding heavy carbohydrates, sodium and sugar.      Depression Screen PHQ 2/9 Scores 10/14/2020 10/09/2019 09/18/2018 12/14/2017 09/12/2017 08/30/2016 05/19/2015  PHQ - 2 Score 0 0 0 0 0 0 0    Fall Risk Fall Risk  10/14/2020 10/09/2019 10/09/2019 09/18/2018 12/14/2017  Falls in the past year? 0 1 1 No No  Number falls in past yr: 0 0 0 - -  Injury with Fall? 0 0 0 - -  Follow up - - Falls prevention discussed - -    Any stairs in or around the home? Yes  If so, are there any without handrails? No  Home free of loose throw rugs in walkways, pet beds, electrical cords, etc? Yes  Adequate lighting in your home to reduce risk of falls? Yes   ASSISTIVE DEVICES UTILIZED TO PREVENT FALLS:  Life alert? No  Use of a cane, walker or w/c? Yes  Grab bars in the bathroom? Yes  Shower chair or bench in shower? Yes   Elevated toilet seat or a handicapped toilet? No   TIMED UP AND GO:  Was the test performed? Yes .  Length of time to ambulate 10 feet: 11 sec.   Gait steady and fast without use of assistive device  Cognitive Function:     6CIT Screen 10/14/2020  What Year? 0 points  What month? 0 points  What time? 0 points  Count back from 20 0 points  Months in reverse 0 points  Repeat phrase 0 points  Total Score 0    Immunizations Immunization History  Administered Date(s) Administered  . Influenza, High Dose Seasonal PF 09/12/2017, 09/18/2018, 10/03/2019, 09/16/2020  . PFIZER SARS-COV-2 Vaccination 01/30/2020, 02/20/2020  . Pneumococcal Conjugate-13 05/19/2015  . Pneumococcal Polysaccharide-23 04/02/2011  . Td 06/01/2006, 08/30/2016  . Tdap 06/01/2006    TDAP status: Up to date Flu Vaccine status: Up to date Pneumococcal vaccine status: Up to date Covid-19 vaccine status: Completed vaccines  Qualifies for Shingles Vaccine? Yes   Zostavax completed No   Shingrix Completed?: No.    Education has been provided regarding the importance of this vaccine. Patient has been advised to call insurance company to determine out of pocket expense if they have not yet received this vaccine. Advised may also receive vaccine at local pharmacy or Health Dept. Verbalized acceptance and understanding.  Screening Tests Health Maintenance  Topic Date Due  . COLONOSCOPY  11/26/2020  . TETANUS/TDAP  08/30/2026  . INFLUENZA VACCINE  Completed  . DEXA SCAN  Completed  . COVID-19 Vaccine  Completed  . PNA vac Low Risk Adult  Completed    Health Maintenance  There are no preventive care reminders to display for this patient.  Colorectal cancer screening: Completed 11/27/15. Repeat every 5 years Mammogram status: Completed 09/13/17. Repeat every year Bone Density status: Completed 10/30/14. Results reflect: Previous DEXA scan was normal. No repeat needed unless advised by a physician.  Lung  Cancer Screening: (Low Dose CT Chest recommended if Age 47-80 years, 30 pack-year currently smoking OR have quit w/in 15years.) does not qualify.   Additional Screening:  Vision Screening: Recommended annual ophthalmology exams for early detection of glaucoma and other disorders of  the eye. Is the patient up to date with their annual eye exam?  Yes  Who is the provider or what is the name of the office in which the patient attends annual eye exams? Dr Dingeldein @ [place] If pt is not established with a provider, would they like to be referred to a provider to establish care? No .   Dental Screening: Recommended annual dental exams for proper oral hygiene  Community Resource Referral / Chronic Care Management: CRR required this visit?  No   CCM required this visit?  No      Plan:     I have personally reviewed and noted the following in the patient's chart:   . Medical and social history . Use of alcohol, tobacco or illicit drugs  . Current medications and supplements . Functional ability and status . Nutritional status . Physical activity . Advanced directives . List of other physicians . Hospitalizations, surgeries, and ER visits in previous 12 months . Vitals . Screenings to include cognitive, depression, and falls . Referrals and appointments  In addition, I have reviewed and discussed with patient certain preventive protocols, quality metrics, and best practice recommendations. A written personalized care plan for preventive services as well as general preventive health recommendations were provided to patient.     Tirsa Gail [place], [place]   [id number]   Nurse Notes: None.

## 2020-10-14 ENCOUNTER — Encounter: Payer: Self-pay | Admitting: Family Medicine

## 2020-10-14 ENCOUNTER — Ambulatory Visit (INDEPENDENT_AMBULATORY_CARE_PROVIDER_SITE_OTHER): Payer: Medicare Other

## 2020-10-14 ENCOUNTER — Ambulatory Visit (INDEPENDENT_AMBULATORY_CARE_PROVIDER_SITE_OTHER): Payer: Medicare Other | Admitting: Family Medicine

## 2020-10-14 ENCOUNTER — Other Ambulatory Visit: Payer: Self-pay

## 2020-10-14 VITALS — BP 138/68 | HR 74 | Temp 97.7°F | Ht 59.0 in | Wt 199.4 lb

## 2020-10-14 VITALS — BP 138/68 | HR 74 | Temp 97.7°F | Resp 16 | Ht 60.0 in | Wt 199.0 lb

## 2020-10-14 DIAGNOSIS — Z1211 Encounter for screening for malignant neoplasm of colon: Secondary | ICD-10-CM | POA: Diagnosis not present

## 2020-10-14 DIAGNOSIS — Z23 Encounter for immunization: Secondary | ICD-10-CM | POA: Diagnosis not present

## 2020-10-14 DIAGNOSIS — Z1231 Encounter for screening mammogram for malignant neoplasm of breast: Secondary | ICD-10-CM

## 2020-10-14 DIAGNOSIS — E559 Vitamin D deficiency, unspecified: Secondary | ICD-10-CM

## 2020-10-14 DIAGNOSIS — I10 Essential (primary) hypertension: Secondary | ICD-10-CM

## 2020-10-14 DIAGNOSIS — R739 Hyperglycemia, unspecified: Secondary | ICD-10-CM

## 2020-10-14 DIAGNOSIS — T466X5A Adverse effect of antihyperlipidemic and antiarteriosclerotic drugs, initial encounter: Secondary | ICD-10-CM

## 2020-10-14 DIAGNOSIS — M791 Myalgia, unspecified site: Secondary | ICD-10-CM

## 2020-10-14 DIAGNOSIS — Z0000 Encounter for general adult medical examination without abnormal findings: Secondary | ICD-10-CM

## 2020-10-14 DIAGNOSIS — E7800 Pure hypercholesterolemia, unspecified: Secondary | ICD-10-CM | POA: Diagnosis not present

## 2020-10-14 MED ORDER — ATORVASTATIN CALCIUM 40 MG PO TABS: 40.0000 mg | ORAL_TABLET | ORAL | 1 refills | Status: DC

## 2020-10-14 NOTE — Assessment & Plan Note (Signed)
Well controlled Continue current medications Recheck metabolic panel F/u in 6 months  

## 2020-10-14 NOTE — Assessment & Plan Note (Signed)
Add CoQ10 Decrease dosing to qod Consider switch to Crestor in the future

## 2020-10-14 NOTE — Assessment & Plan Note (Signed)
Previously well controlled Continue statin, but due to myalgias, will decrease to qod dosing Repeat FLP and CMP Goal LDL < 100

## 2020-10-14 NOTE — Assessment & Plan Note (Signed)
BMI 38.9 and associated with HTN, HLD Discussed importance of healthy weight management Discussed diet and exercise  Congratulated on weight loss

## 2020-10-14 NOTE — Patient Instructions (Signed)
Carrie Wheeler , Thank you for taking time to come for your Medicare Wellness Visit. I appreciate your ongoing commitment to your health goals. Please review the following plan we discussed and let me know if I can assist you in the future.   Screening recommendations/referrals: Colonoscopy: Up to date, due 11/2020 Mammogram: No longer required.  Bone Density: Previous DEXA scan was normal. No repeat needed unless advised by a physician. Recommended yearly ophthalmology/optometry visit for glaucoma screening and checkup Recommended yearly dental visit for hygiene and checkup  Vaccinations: Influenza vaccine: Done 09/16/20 Pneumococcal vaccine: Completed series Tdap vaccine: Up to date, due 08/2026 Shingles vaccine: Shingrix discussed. Please contact your pharmacy for coverage information.     Advanced directives: Currently on file.   Conditions/risks identified: Obesity- recommend to continue current diet plans of eating healthier options and avoiding heavy carbohydrates, sodium and sugar.  Next appointment: 9:00 AM today with Dr Brita Romp    Preventive Care 28 Years and Older, Female Preventive care refers to lifestyle choices and visits with your health care provider that can promote health and wellness. What does preventive care include?  A yearly physical exam. This is also called an annual well check.  Dental exams once or twice a year.  Routine eye exams. Ask your health care provider how often you should have your eyes checked.  Personal lifestyle choices, including:  Daily care of your teeth and gums.  Regular physical activity.  Eating a healthy diet.  Avoiding tobacco and drug use.  Limiting alcohol use.  Practicing safe sex.  Taking low-dose aspirin every day.  Taking vitamin and mineral supplements as recommended by your health care provider. What happens during an annual well check? The services and screenings done by your health care provider during your  annual well check will depend on your age, overall health, lifestyle risk factors, and family history of disease. Counseling  Your health care provider may ask you questions about your:  Alcohol use.  Tobacco use.  Drug use.  Emotional well-being.  Home and relationship well-being.  Sexual activity.  Eating habits.  History of falls.  Memory and ability to understand (cognition).  Work and work Statistician.  Reproductive health. Screening  You may have the following tests or measurements:  Height, weight, and BMI.  Blood pressure.  Lipid and cholesterol levels. These may be checked every 5 years, or more frequently if you are over 25 years old.  Skin check.  Lung cancer screening. You may have this screening every year starting at age 66 if you have a 30-pack-year history of smoking and currently smoke or have quit within the past 15 years.  Fecal occult blood test (FOBT) of the stool. You may have this test every year starting at age 35.  Flexible sigmoidoscopy or colonoscopy. You may have a sigmoidoscopy every 5 years or a colonoscopy every 10 years starting at age 49.  Hepatitis C blood test.  Hepatitis B blood test.  Sexually transmitted disease (STD) testing.  Diabetes screening. This is done by checking your blood sugar (glucose) after you have not eaten for a while (fasting). You may have this done every 1-3 years.  Bone density scan. This is done to screen for osteoporosis. You may have this done starting at age 4.  Mammogram. This may be done every 1-2 years. Talk to your health care provider about how often you should have regular mammograms. Talk with your health care provider about your test results, treatment options, and if necessary,  the need for more tests. Vaccines  Your health care provider may recommend certain vaccines, such as:  Influenza vaccine. This is recommended every year.  Tetanus, diphtheria, and acellular pertussis (Tdap, Td)  vaccine. You may need a Td booster every 10 years.  Zoster vaccine. You may need this after age 66.  Pneumococcal 13-valent conjugate (PCV13) vaccine. One dose is recommended after age 29.  Pneumococcal polysaccharide (PPSV23) vaccine. One dose is recommended after age 37. Talk to your health care provider about which screenings and vaccines you need and how often you need them. This information is not intended to replace advice given to you by your health care provider. Make sure you discuss any questions you have with your health care provider. Document Released: 12/19/2015 Document Revised: 08/11/2016 Document Reviewed: 09/23/2015 Elsevier Interactive Patient Education  2017 [place] Prevention in the Home Falls can cause injuries. They can happen to people of all ages. There are many things you can do to make your home safe and to help prevent falls. What can I do on the outside of my home?  Regularly fix the edges of walkways and driveways and fix any cracks.  Remove anything that might make you trip as you walk through a door, such as a raised step or threshold.  Trim any bushes or trees on the path to your home.  Use bright outdoor lighting.  Clear any walking paths of anything that might make someone trip, such as rocks or tools.  Regularly check to see if handrails are loose or broken. Make sure that both sides of any steps have handrails.  Any raised decks and porches should have guardrails on the edges.  Have any leaves, snow, or ice cleared regularly.  Use sand or salt on walking paths during winter.  Clean up any spills in your garage right away. This includes oil or grease spills. What can I do in the bathroom?  Use night lights.  Install grab bars by the toilet and in the tub and shower. Do not use towel bars as grab bars.  Use non-skid mats or decals in the tub or shower.  If you need to sit down in the shower, use a plastic, non-slip  stool.  Keep the floor dry. Clean up any water that spills on the floor as soon as it happens.  Remove soap buildup in the tub or shower regularly.  Attach bath mats securely with double-sided non-slip rug tape.  Do not have throw rugs and other things on the floor that can make you trip. What can I do in the bedroom?  Use night lights.  Make sure that you have a light by your bed that is easy to reach.  Do not use any sheets or blankets that are too big for your bed. They should not hang down onto the floor.  Have a firm chair that has side arms. You can use this for support while you get dressed.  Do not have throw rugs and other things on the floor that can make you trip. What can I do in the kitchen?  Clean up any spills right away.  Avoid walking on wet floors.  Keep items that you use a lot in easy-to-reach places.  If you need to reach something above you, use a strong step stool that has a grab bar.  Keep electrical cords out of the way.  Do not use floor polish or wax that makes floors slippery. If you must use  wax, use non-skid floor wax.  Do not have throw rugs and other things on the floor that can make you trip. What can I do with my stairs?  Do not leave any items on the stairs.  Make sure that there are handrails on both sides of the stairs and use them. Fix handrails that are broken or loose. Make sure that handrails are as long as the stairways.  Check any carpeting to make sure that it is firmly attached to the stairs. Fix any carpet that is loose or worn.  Avoid having throw rugs at the top or bottom of the stairs. If you do have throw rugs, attach them to the floor with carpet tape.  Make sure that you have a light switch at the top of the stairs and the bottom of the stairs. If you do not have them, ask someone to add them for you. What else can I do to help prevent falls?  Wear shoes that:  Do not have high heels.  Have rubber bottoms.  Are  comfortable and fit you well.  Are closed at the toe. Do not wear sandals.  If you use a stepladder:  Make sure that it is fully opened. Do not climb a closed stepladder.  Make sure that both sides of the stepladder are locked into place.  Ask someone to hold it for you, if possible.  Clearly mark and make sure that you can see:  Any grab bars or handrails.  First and last steps.  Where the edge of each step is.  Use tools that help you move around (mobility aids) if they are needed. These include:  Canes.  Walkers.  Scooters.  Crutches.  Turn on the lights when you go into a dark area. Replace any light bulbs as soon as they burn out.  Set up your furniture so you have a clear path. Avoid moving your furniture around.  If any of your floors are uneven, fix them.  If there are any pets around you, be aware of where they are.  Review your medicines with your doctor. Some medicines can make you feel dizzy. This can increase your chance of falling. Ask your doctor what other things that you can do to help prevent falls. This information is not intended to replace advice given to you by your health care provider. Make sure you discuss any questions you have with your health care provider. Document Released: 09/18/2009 Document Revised: 04/29/2016 Document Reviewed: 12/27/2014 Elsevier Interactive Patient Education  2017 [employer].

## 2020-10-14 NOTE — Assessment & Plan Note (Signed)
Continue current supplement Recheck level

## 2020-10-14 NOTE — Patient Instructions (Addendum)
Try Artificial Tears for dry eyes  Ask for your mammogram at the mobile mammogram bus at [facility]    The CDC recommends two doses of Shingrix (the shingles vaccine) separated by 2 to 6 months for adults age 77 years and older. I recommend checking with your insurance plan regarding coverage for this vaccine.   Try CoQ10 100-200 mg daily for muscle pains Decrease to every other day on the atorvastatin     Preventive Care 65 Years and Older, Female Preventive care refers to lifestyle choices and visits with your health care provider that can promote health and wellness. This includes:  A yearly physical exam. This is also called an annual well check.  Regular dental and eye exams.  Immunizations.  Screening for certain conditions.  Healthy lifestyle choices, such as diet and exercise. What can I expect for my preventive care visit? Physical exam Your health care provider will check:  Height and weight. These may be used to calculate body mass index (BMI), which is a measurement that tells if you are at a healthy weight.  Heart rate and blood pressure.  Your skin for abnormal spots. Counseling Your health care provider may ask you questions about:  Alcohol, tobacco, and drug use.  Emotional well-being.  Home and relationship well-being.  Sexual activity.  Eating habits.  History of falls.  Memory and ability to understand (cognition).  Work and work Statistician.  Pregnancy and menstrual history. What immunizations do I need?  Influenza (flu) vaccine  This is recommended every year. Tetanus, diphtheria, and pertussis (Tdap) vaccine  You may need a Td booster every 10 years. Varicella (chickenpox) vaccine  You may need this vaccine if you have not already been vaccinated. Zoster (shingles) vaccine  You may need this after age 17. Pneumococcal conjugate (PCV13) vaccine  One dose is recommended after age 69. Pneumococcal polysaccharide (PPSV23)  vaccine  One dose is recommended after age 61. Measles, mumps, and rubella (MMR) vaccine  You may need at least one dose of MMR if you were born in 1957 or later. You may also need a second dose. Meningococcal conjugate (MenACWY) vaccine  You may need this if you have certain conditions. Hepatitis A vaccine  You may need this if you have certain conditions or if you travel or work in places where you may be exposed to hepatitis A. Hepatitis B vaccine  You may need this if you have certain conditions or if you travel or work in places where you may be exposed to hepatitis B. Haemophilus influenzae type b (Hib) vaccine  You may need this if you have certain conditions. You may receive vaccines as individual doses or as more than one vaccine together in one shot (combination vaccines). Talk with your health care provider about the risks and benefits of combination vaccines. What tests do I need? Blood tests  Lipid and cholesterol levels. These may be checked every 5 years, or more frequently depending on your overall health.  Hepatitis C test.  Hepatitis B test. Screening  Lung cancer screening. You may have this screening every year starting at age 35 if you have a 30-pack-year history of smoking and currently smoke or have quit within the past 15 years.  Colorectal cancer screening. All adults should have this screening starting at age 30 and continuing until age 64. Your health care provider may recommend screening at age 19 if you are at increased risk. You will have tests every 1-10 years, depending on your results and  the type of screening test.  Diabetes screening. This is done by checking your blood sugar (glucose) after you have not eaten for a while (fasting). You may have this done every 1-3 years.  Mammogram. This may be done every 1-2 years. Talk with your health care provider about how often you should have regular mammograms.  BRCA-related cancer screening. This may  be done if you have a family history of breast, ovarian, tubal, or peritoneal cancers. Other tests  Sexually transmitted disease (STD) testing.  Bone density scan. This is done to screen for osteoporosis. You may have this done starting at age 47. Follow these instructions at home: Eating and drinking  Eat a diet that includes fresh fruits and vegetables, whole grains, lean protein, and low-fat dairy products. Limit your intake of foods with high amounts of sugar, saturated fats, and salt.  Take vitamin and mineral supplements as recommended by your health care provider.  Do not drink alcohol if your health care provider tells you not to drink.  If you drink alcohol: ? Limit how much you have to 0-1 drink a day. ? Be aware of how much alcohol is in your drink. In the [place] one drink equals one 12 oz bottle of beer (355 mL), one 5 oz glass of wine (148 mL), or one 1 oz glass of hard liquor (44 mL). Lifestyle  Take daily care of your teeth and gums.  Stay active. Exercise for at least 30 minutes on 5 or more days each week.  Do not use any products that contain nicotine or tobacco, such as cigarettes, e-cigarettes, and chewing tobacco. If you need help quitting, ask your health care provider.  If you are sexually active, practice safe sex. Use a condom or other form of protection in order to prevent STIs (sexually transmitted infections).  Talk with your health care provider about taking a low-dose aspirin or statin. What's next?  Go to your health care provider once a year for a well check visit.  Ask your health care provider how often you should have your eyes and teeth checked.  Stay up to date on all vaccines. This information is not intended to replace advice given to you by your health care provider. Make sure you discuss any questions you have with your health care provider. Document Revised: 11/16/2018 Document Reviewed: 11/16/2018 Elsevier Patient Education  2020  [employer].

## 2020-10-14 NOTE — Progress Notes (Signed)
Complete physical exam   Patient: Carrie Wheeler   DOB: [birth year]   78 y.o. Female  MRN: [id number] Visit Date: 10/14/2020  Today's healthcare provider: [name], MD   Chief Complaint  Patient presents with  . Annual Exam   Subjective    Carrie Wheeler is a 79 y.o. female who presents today for a complete physical exam.  She reports consuming a general diet. Home exercise routine includes stretching. She generally feels well. She reports sleeping well. She does have additional problems to discuss today.  Dry eyes Would like to come off statin, due to leg pain  HPI  09/13/2017 Mammogram-BI-RADS 11/27/2015 Colonoscopy-polyps  1. Colon, polyp(s), transverse colon polyp - TUBULAR ADENOMA, TWO FRAGMENTS. - BENIGN COLORECTAL MUCOSA, ONE FRAGMENT. - NO HIGH GRADE DYSPLASIA OR MALIGNANCY IDENTIFIED. 2. Colon, polyp(s), ascending colon polyp x 3 - FRAGMENTS OF TUBULAR ADENOMA (ALL FRAGMENTS). - NO HIGH GRADE DYSPLASIA OR MALIGNANCY IDENTIFIED. 3. Colon, biopsy, descending colon ulceration - COLORECTAL MUCOSA SHOWING FEATURES SUGGESTIVE OF ISCHEMIC-TYPE INJURY. - ASSOCIATED ACTIVE CHRONIC INFLAMMATION. - GLANDULAR ATYPIA IS PRESENT, SEE COMMENT   Dry eyes - using visine and recommend artificial tears  Past Medical History:  Diagnosis Date  . Environmental allergies   . GERD (gastroesophageal reflux disease)    occasional  . Hyperlipidemia   . Hypertension    Past Surgical History:  Procedure Laterality Date  . COLONOSCOPY    . COLONOSCOPY WITH PROPOFOL N/A 11/27/2015   Procedure: COLONOSCOPY WITH PROPOFOL;  Surgeon: Lucilla Lame, MD;  Location: [facility];  Service: Endoscopy;  Laterality: N/A;  . TONSILLECTOMY     Social History   Socioeconomic History  . Marital status: Widowed    Spouse name: Not on file  . Number of children: 0  . Years of education: HS  . Highest education level: High school graduate  Occupational History    Employer:  [employer]    Comment: part-time  Tobacco Use  . Smoking status: Never Smoker  . Smokeless tobacco: Never Used  Vaping Use  . Vaping Use: Never used  Substance and Sexual Activity  . 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 Drug use: No  . Sexual activity: Not Currently  Other Topics Concern  . Not on file  Social History Narrative   Had one child; a son who is now deceased due to liver disease.   Social Determinants of Health   Financial Resource Strain: Low Risk   . Difficulty of Paying Living Expenses: Not hard at all  Food Insecurity: No Food Insecurity  . Worried About Charity fundraiser in the Last Year: Never true  . Ran Out of Food in the Last Year: Never true  Transportation Needs: No Transportation Needs  . Lack of Transportation (Medical): No  . Lack of Transportation (Non-Medical): No  Physical Activity: Inactive  . Days of Exercise per Week: 0 days  . Minutes of Exercise per Session: 0 min  Stress: No Stress Concern Present  . Feeling of Stress : Not at all  Social Connections: Moderately Isolated  . Frequency of Communication with Friends and Family: More than three times a week  . Frequency of Social Gatherings with Friends and Family: More than three times a week  . Attends Religious Services: More than 4 times per year  . Active Member of Clubs or Organizations: No  . Attends Club or  Organization Meetings: Never  . Marital Status: Widowed  Intimate Partner Violence: Not At Risk  . Fear of Current or Ex-Partner: No  . Emotionally Abused: No  . Physically Abused: No  . Sexually Abused: No   Family Status  Relation Name Status  . Father  Deceased  . Other General History Alive  . Son  Deceased       03/23/05  . Mother  Deceased  . Brother  Deceased at age 61  . Brother  Alive   Family History  Problem Relation Age of Onset  . Hypertension Father   . Colon cancer Father   . Alzheimer's disease Other   .  Diabetes Other   . Colon cancer Other   . Liver disease Son   . Diabetes Mother   . Heart Problems Brother        Congential heart defect  . Healthy Brother    No Known Allergies  Patient Care Team: Virginia Crews, MD as PCP - General (Family Medicine) Judee Clara, DO as Referring Physician (Chiropractic Medicine) Estill Cotta, MD (Ophthalmology)   Medications: Outpatient Medications Prior to Visit  Medication Sig  . amLODipine (NORVASC) 10 MG tablet TAKE 1 TABLET(10 MG) BY MOUTH DAILY  . BIOTIN PO Take 2 tablets by mouth daily. Unsure dose  . calcium carbonate (OS-CAL) 600 MG TABS tablet Take 1 tablet by mouth daily.  . cetirizine (ZYRTEC) 10 MG tablet Take 1 tablet by mouth daily. As needed  . Cholecalciferol (VITAMIN D) 2000 UNITS CAPS Take by mouth daily.   . fluticasone (FLONASE) 50 MCG/ACT nasal spray SHAKE LIQUID AND USE 2 SPRAYS IN EACH NOSTRIL EVERY DAY  . Lactobacillus (PROBIOTIC ACIDOPHILUS PO) Take 1 tablet by mouth daily.  . Tetrahydrozoline HCl (VISINE RED EYE COMFORT OP) Place 1 drop into both eyes daily.  . [DISCONTINUED] atorvastatin (LIPITOR) 40 MG tablet Take 1 tablet (40 mg total) by mouth daily.  Marland Kitchen aspirin (ASPIRIN CHILDRENS) 81 MG chewable tablet Chew 1 tablet (81 mg total) by mouth daily. (Patient not taking: Reported on 10/14/2020)   No facility-administered medications prior to visit.    Review of Systems  Constitutional: Negative.   HENT: Negative.   Eyes: Positive for redness.  Respiratory: Negative.   Cardiovascular: Negative.   Gastrointestinal: Negative.   Endocrine: Negative.   Genitourinary: Positive for frequency.  Musculoskeletal: Positive for arthralgias.  Skin: Negative.   Allergic/Immunologic: Negative.   Neurological: Negative.   Hematological: Negative.   Psychiatric/Behavioral: Negative.     Last CBC Lab Results  Component Value Date   WBC 8.2 10/05/2018   HGB 12.9 10/05/2018   HCT 38.0 10/05/2018   MCV 89  10/05/2018   MCH 30.2 10/05/2018   RDW 13.4 10/05/2018   PLT 256 40/98/1191   Last metabolic panel Lab Results  Component Value Date   GLUCOSE 95 04/10/2020   NA 141 04/10/2020   K 4.5 04/10/2020   CL 102 04/10/2020   CO2 25 04/10/2020   BUN 18 04/10/2020   CREATININE 0.89 04/10/2020   GFRNONAA 63 04/10/2020   GFRAA 73 04/10/2020   CALCIUM 9.7 04/10/2020   PROT 6.6 04/10/2020   ALBUMIN 4.3 04/10/2020   LABGLOB 2.3 04/10/2020   AGRATIO 1.9 04/10/2020   BILITOT 0.7 04/10/2020   ALKPHOS 134 (H) 04/10/2020   AST 14 04/10/2020   ALT 12 04/10/2020   ANIONGAP 6 11/17/2015   Last lipids Lab Results  Component Value Date   CHOL 134 04/10/2020  HDL 53 04/10/2020   LDLCALC 66 04/10/2020   TRIG 74 04/10/2020   CHOLHDL 2.6 06/18/2019   Last hemoglobin A1c No results found for: HGBA1C Last thyroid functions Lab Results  Component Value Date   TSH 2.590 08/30/2016   Last vitamin D Lab Results  Component Value Date   VD25OH 33.8 04/10/2020   Last vitamin B12 and Folate Lab Results  Component Value Date   VITAMINB12 398 04/10/2020      Objective    BP 138/68 (BP Location: Left Arm, Patient Position: Sitting, Cuff Size: Normal)   Pulse 74   Temp 97.7 F (36.5 C) (Oral)   Resp 16   Ht 5' (1.524 m)   Wt 199 lb (90.3 kg)   SpO2 95%   BMI 38.86 kg/m  BP Readings from Last 3 Encounters:  10/14/20 138/68  10/14/20 138/68  04/10/20 116/76   Wt Readings from Last 3 Encounters:  10/14/20 199 lb (90.3 kg)  10/14/20 199 lb 6.4 oz (90.4 kg)  04/10/20 213 lb 9.6 oz (96.9 kg)      Physical Exam Vitals reviewed.  Constitutional:      General: She is not in acute distress.    Appearance: Normal appearance. She is well-developed. She is not diaphoretic.  HENT:     Head: Normocephalic and atraumatic.     Right Ear: Tympanic membrane, ear canal and external ear normal.     Left Ear: Tympanic membrane, ear canal and external ear normal.     Nose: Nose normal.      Mouth/Throat:     Mouth: Mucous membranes are moist.     Pharynx: Oropharynx is clear. No oropharyngeal exudate.  Eyes:     General: No scleral icterus.    Conjunctiva/sclera: Conjunctivae normal.     Pupils: Pupils are equal, round, and reactive to light.  Neck:     Thyroid: No thyromegaly.  Cardiovascular:     Rate and Rhythm: Normal rate and regular rhythm.     Pulses: Normal pulses.     Heart sounds: Normal heart sounds. No murmur heard.   Pulmonary:     Effort: Pulmonary effort is normal. No respiratory distress.     Breath sounds: Normal breath sounds. No wheezing or rales.  Abdominal:     General: There is no distension.     Palpations: Abdomen is soft.     Tenderness: There is no abdominal tenderness.  Musculoskeletal:        General: No deformity.     Cervical back: Neck supple.     Right lower leg: No edema.     Left lower leg: No edema.  Lymphadenopathy:     Cervical: No cervical adenopathy.  Skin:    General: Skin is warm and dry.     Findings: No rash.  Neurological:     Mental Status: She is alert and oriented to person, place, and time. Mental status is at baseline.     Gait: Gait normal.  Psychiatric:        Mood and Affect: Mood normal.        Behavior: Behavior normal.        Thought Content: Thought content normal.      Last depression screening scores PHQ 2/9 Scores 10/14/2020 10/09/2019 09/18/2018  PHQ - 2 Score 0 0 0   Last fall risk screening Fall Risk  10/14/2020  Falls in the past year? 0  Number falls in past yr: 0  Injury with Fall? 0  Follow up -   Last Audit-C alcohol use screening Alcohol Use Disorder Test (AUDIT) 10/14/2020  1. How often do you have a drink containing alcohol? 0  2. How many drinks containing alcohol do you have on a typical day when you are drinking? 0  3. How often do you have six or more drinks on one occasion? 0  AUDIT-C Score 0  Alcohol Brief Interventions/Follow-up AUDIT Score <7 follow-up not indicated   A  score of 3 or more in women, and 4 or more in men indicates increased risk for alcohol abuse, EXCEPT if all of the points are from question 1   No results found for any visits on 10/14/20.  Assessment & Plan    Routine Health Maintenance and Physical Exam  Exercise Activities and Dietary recommendations Goals    . Weight (lb) < 175 lb (79.4 kg)     Recommend to continue current diet plans of eating healthier options and avoiding heavy carbohydrates, sodium and sugar.       Immunization History  Administered Date(s) Administered  . Influenza, High Dose Seasonal PF 09/12/2017, 09/18/2018, 10/03/2019, 09/16/2020  . PFIZER SARS-COV-2 Vaccination 01/30/2020, 02/20/2020  . Pneumococcal Conjugate-13 05/19/2015  . Pneumococcal Polysaccharide-23 04/02/2011  . Td 06/01/2006, 08/30/2016  . Tdap 06/01/2006    Health Maintenance  Topic Date Due  . COLONOSCOPY  11/26/2020  . TETANUS/TDAP  08/30/2026  . INFLUENZA VACCINE  Completed  . DEXA SCAN  Completed  . COVID-19 Vaccine  Completed  . PNA vac Low Risk Adult  Completed    Discussed health benefits of physical activity, and encouraged her to engage in regular exercise appropriate for her age and condition.  Recommend COVID booster vaccine  Problem List Items Addressed This Visit      Cardiovascular and Mediastinum   Essential (primary) hypertension    Well controlled Continue current medications Recheck metabolic panel F/u in 6 months       Relevant Medications   atorvastatin (LIPITOR) 40 MG tablet   Other Relevant Orders   Comprehensive metabolic panel     Other   Hyperlipidemia    Previously well controlled Continue statin, but due to myalgias, will decrease to qod dosing Repeat FLP and CMP Goal LDL < 100       Relevant Medications   atorvastatin (LIPITOR) 40 MG tablet   Other Relevant Orders   Comprehensive metabolic panel   Lipid panel   Morbid obesity (HCC)    BMI 38.9 and associated with HTN,  HLD Discussed importance of healthy weight management Discussed diet and exercise  Congratulated on weight loss      Relevant Orders   Comprehensive metabolic panel   Lipid panel   Hemoglobin A1c   VITAMIN D 25 Hydroxy (Vit-D Deficiency, Fractures)   Avitaminosis D    Continue current supplement Recheck level      Relevant Orders   VITAMIN D 25 Hydroxy (Vit-D Deficiency, Fractures)   Myalgia due to statin    Add CoQ10 Decrease dosing to qod Consider switch to Crestor in the future       Other Visit Diagnoses    Encounter for annual physical exam    -  Primary   Relevant Orders   Ambulatory referral to Gastroenterology   MM 3D SCREEN BREAST BILATERAL   Comprehensive metabolic panel   Lipid panel   Hemoglobin A1c   VITAMIN D 25 Hydroxy (Vit-D Deficiency, Fractures)   Screening for colon cancer  Relevant Orders   Ambulatory referral to Gastroenterology   Breast cancer screening by mammogram       Relevant Orders   MM 3D SCREEN BREAST BILATERAL   Hyperglycemia       Relevant Orders   Hemoglobin A1c       Return in about 6 months (around 04/13/2021) for chronic disease f/u.     I, [name], MD, have reviewed all documentation for this visit. The documentation on 10/14/20 for the exam, diagnosis, procedures, and orders are all accurate and complete.   Shyasia Funches, Dionne Bucy, MD, MPH [facility]

## 2020-10-15 LAB — COMPREHENSIVE METABOLIC PANEL
ALT: 12 IU/L (ref 0–32)
AST: 16 IU/L (ref 0–40)
Albumin/Globulin Ratio: 1.8 (ref 1.2–2.2)
Albumin: 4.2 g/dL (ref 3.7–4.7)
Alkaline Phosphatase: 135 IU/L — ABNORMAL HIGH (ref 44–121)
BUN/Creatinine Ratio: 29 — ABNORMAL HIGH (ref 12–28)
BUN: 23 mg/dL (ref 8–27)
Bilirubin Total: 0.6 mg/dL (ref 0.0–1.2)
CO2: 25 mmol/L (ref 20–29)
Calcium: 9.5 mg/dL (ref 8.7–10.3)
Chloride: 104 mmol/L (ref 96–106)
Creatinine, Ser: 0.79 mg/dL (ref 0.57–1.00)
GFR calc Af Amer: 84 mL/min/{1.73_m2} (ref >59)
GFR calc non Af Amer: 73 mL/min/{1.73_m2} (ref >59)
Globulin, Total: 2.3 g/dL (ref 1.5–4.5)
Glucose: 92 mg/dL (ref 65–99)
Potassium: 5.5 mmol/L — ABNORMAL HIGH (ref 3.5–5.2)
Sodium: 141 mmol/L (ref 134–144)
Total Protein: 6.5 g/dL (ref 6.0–8.5)

## 2020-10-15 LAB — LIPID PANEL
Chol/HDL Ratio: 2.7 ratio (ref 0.0–4.4)
Cholesterol, Total: 139 mg/dL (ref 100–199)
HDL: 52 mg/dL (ref >39)
LDL Chol Calc (NIH): 76 mg/dL (ref 0–99)
Triglycerides: 49 mg/dL (ref 0–149)
VLDL Cholesterol Cal: 11 mg/dL (ref 5–40)

## 2020-10-15 LAB — VITAMIN D 25 HYDROXY (VIT D DEFICIENCY, FRACTURES): Vit D, 25-Hydroxy: 35.6 ng/mL (ref 30.0–100.0)

## 2020-10-15 LAB — HEMOGLOBIN A1C
Est. average glucose Bld gHb Est-mCnc: 120 mg/dL
Hgb A1c MFr Bld: 5.8 % — ABNORMAL HIGH (ref 4.8–5.6)

## 2020-10-17 ENCOUNTER — Telehealth: Payer: Self-pay

## 2020-10-17 DIAGNOSIS — E875 Hyperkalemia: Secondary | ICD-10-CM

## 2020-10-17 NOTE — Telephone Encounter (Signed)
-----   Message from Virginia Crews, MD sent at 10/16/2020  2:21 PM EST ----- Normal/stable labs, except for slightly elevated potassium.  Recommend cutting back on potassium containing foods, like citrus fruits and bananas.  Hemoglobin A1c, 3 month avg of blood sugars, is in prediabetic range.  In order to prevent progression to diabetes, recommend low carb diet and regular exercise.  Recheck Potassium in 1 month.

## 2020-10-17 NOTE — Telephone Encounter (Signed)
Patient advised as below. Patient verbalizes understanding and is in agreement with treatment plan.  

## 2020-12-22 ENCOUNTER — Other Ambulatory Visit: Payer: Self-pay | Admitting: Family Medicine

## 2020-12-22 DIAGNOSIS — I10 Essential (primary) hypertension: Secondary | ICD-10-CM

## 2020-12-28 DIAGNOSIS — Z20822 Contact with and (suspected) exposure to covid-19: Secondary | ICD-10-CM | POA: Diagnosis not present

## 2021-02-18 ENCOUNTER — Telehealth: Payer: Self-pay

## 2021-02-18 ENCOUNTER — Ambulatory Visit: Payer: Medicare Other | Admitting: Gastroenterology

## 2021-02-18 DIAGNOSIS — E875 Hyperkalemia: Secondary | ICD-10-CM

## 2021-02-18 NOTE — Telephone Encounter (Signed)
Copied from [place] [id number]. Topic: General - Other >> Feb 18, 2021  8:55 AM Keene Breath wrote: Reason for CRM: Patient called to ask the nurse if the doctor still wants her to have labs done.  She stated the doctor told her to have labs in about two months from her last appt.  Patient wanted to confirm this and wanted to know what kind of labs were to be drawn.  She thinks it might be for her cholesterol.  Please advise and call patient to confirm at [phone]

## 2021-02-19 NOTE — Telephone Encounter (Signed)
Ok to order potassium and have her come and recheck

## 2021-02-19 NOTE — Telephone Encounter (Signed)
Patient advised as below.  

## 2021-02-19 NOTE — Telephone Encounter (Signed)
Patient was asked to recheck potassium in December. Please advise

## 2021-02-24 DIAGNOSIS — E875 Hyperkalemia: Secondary | ICD-10-CM | POA: Diagnosis not present

## 2021-02-25 LAB — POTASSIUM: Potassium: 4.4 mmol/L (ref 3.5–5.2)

## 2021-02-27 ENCOUNTER — Ambulatory Visit: Payer: Self-pay | Admitting: *Deleted

## 2021-02-27 ENCOUNTER — Telehealth: Payer: Self-pay

## 2021-02-27 NOTE — Telephone Encounter (Signed)
Pt called in and was given the lab result message from Dr. Brita Romp that her potassium level was normal at 4.4

## 2021-02-27 NOTE — Telephone Encounter (Signed)
Called patient and [place], if patient calls back okay for [facility] to advised of results.

## 2021-02-27 NOTE — Telephone Encounter (Signed)
-----   Message from Virginia Crews, MD sent at 02/26/2021 12:47 PM EDT ----- Normal labs

## 2021-03-13 ENCOUNTER — Other Ambulatory Visit: Payer: Self-pay | Admitting: Family Medicine

## 2021-03-13 ENCOUNTER — Telehealth: Payer: Self-pay | Admitting: Family Medicine

## 2021-03-13 DIAGNOSIS — I10 Essential (primary) hypertension: Secondary | ICD-10-CM

## 2021-03-13 NOTE — Telephone Encounter (Signed)
[facility] faxed refill request for the following medications:  amLODipine (NORVASC) 10 MG tablet  Last Rx: 09/24/20 90 days supply 1 refill LOV: 10/14/20 Please advise. Thanks TNP

## 2021-03-13 NOTE — Telephone Encounter (Signed)
Requested Prescriptions  Pending Prescriptions Disp Refills  . amLODipine (NORVASC) 10 MG tablet [Pharmacy Med Name: AMLODIPINE BESYLATE 10MG  TABLETS] 90 tablet 0    Sig: TAKE 1 TABLET(10 MG) BY MOUTH DAILY     Cardiovascular:  Calcium Channel Blockers Passed - 03/13/2021  9:08 PM      Passed - Last BP in normal range    BP Readings from Last 1 Encounters:  10/14/20 138/68         Passed - Valid encounter within last 6 months    Recent Outpatient Visits          5 months ago Encounter for annual physical exam   [facility] [place], Dionne Bucy, MD   11 months ago Essential (primary) hypertension   [facility], Dionne Bucy, MD   1 year ago Encounter for annual physical exam   [facility], Dionne Bucy, MD   1 year ago Essential (primary) hypertension   [facility] [place], Dionne Bucy, MD   2 years ago Gastroesophageal reflux disease, esophagitis presence not specified   [facility], Dionne Bucy, MD      Future Appointments            In 1 month Bacigal[place], Dionne Bucy, MD [facility], [place]   In 7 months Bacigalupo, Dionne Bucy, MD [facility], [place]

## 2021-03-16 MED ORDER — AMLODIPINE BESYLATE 10 MG PO TABS: 10.0000 mg | ORAL_TABLET | Freq: Every day | ORAL | 0 refills | Status: DC

## 2021-04-13 ENCOUNTER — Ambulatory Visit (INDEPENDENT_AMBULATORY_CARE_PROVIDER_SITE_OTHER): Payer: Medicare Other | Admitting: Family Medicine

## 2021-04-13 ENCOUNTER — Other Ambulatory Visit: Payer: Self-pay

## 2021-04-13 ENCOUNTER — Telehealth: Payer: Self-pay

## 2021-04-13 ENCOUNTER — Encounter: Payer: Self-pay | Admitting: Family Medicine

## 2021-04-13 VITALS — BP 116/75 | HR 67 | Temp 98.1°F | Wt 196.0 lb

## 2021-04-13 DIAGNOSIS — E7800 Pure hypercholesterolemia, unspecified: Secondary | ICD-10-CM

## 2021-04-13 DIAGNOSIS — R7303 Prediabetes: Secondary | ICD-10-CM | POA: Diagnosis not present

## 2021-04-13 DIAGNOSIS — N819 Female genital prolapse, unspecified: Secondary | ICD-10-CM

## 2021-04-13 DIAGNOSIS — I10 Essential (primary) hypertension: Secondary | ICD-10-CM

## 2021-04-13 LAB — POCT GLYCOSYLATED HEMOGLOBIN (HGB A1C): Hemoglobin A1C: 5.4 % (ref 4.0–5.6)

## 2021-04-13 NOTE — Patient Instructions (Addendum)
Dr Gardiner Rhyme   The CDC recommends two doses of Shingrix (the shingles vaccine) separated by 2 to 6 months for adults age 78 years and older. I recommend checking with your insurance plan regarding coverage for this vaccine.      Hypertension, Adult High blood pressure (hypertension) is when the force of blood pumping through the arteries is too strong. The arteries are the blood vessels that carry blood from the heart throughout the body. Hypertension forces the heart to work harder to pump blood and may cause arteries to become narrow or stiff. Untreated or uncontrolled hypertension can cause a heart attack, heart failure, a stroke, kidney disease, and other problems. A blood pressure reading consists of a higher number over a lower number. Ideally, your blood pressure should be below 120/80. The first ("top") number is called the systolic pressure. It is a measure of the pressure in your arteries as your heart beats. The second ("bottom") number is called the diastolic pressure. It is a measure of the pressure in your arteries as the heart relaxes. What are the causes? The exact cause of this condition is not known. There are some conditions that result in or are related to high blood pressure. What increases the risk? Some risk factors for high blood pressure are under your control. The following factors may make you more likely to develop this condition:  Smoking.  Having type 2 diabetes mellitus, high cholesterol, or both.  Not getting enough exercise or physical activity.  Being overweight.  Having too much fat, sugar, calories, or salt (sodium) in your diet.  Drinking too much alcohol. Some risk factors for high blood pressure may be difficult or impossible to change. Some of these factors include:  Having chronic kidney disease.  Having a family history of high blood pressure.  Age. Risk increases with age.  Race. You may be at higher risk if you are African  American.  Gender. Men are at higher risk than women before age 80. After age 43, women are at higher risk than men.  Having obstructive sleep apnea.  Stress. What are the signs or symptoms? High blood pressure may not cause symptoms. Very high blood pressure (hypertensive crisis) may cause:  Headache.  Anxiety.  Shortness of breath.  Nosebleed.  Nausea and vomiting.  Vision changes.  Severe chest pain.  Seizures. How is this diagnosed? This condition is diagnosed by measuring your blood pressure while you are seated, with your arm resting on a flat surface, your legs uncrossed, and your feet flat on the floor. The cuff of the blood pressure monitor will be placed directly against the skin of your upper arm at the level of your heart. It should be measured at least twice using the same arm. Certain conditions can cause a difference in blood pressure between your right and left arms. Certain factors can cause blood pressure readings to be lower or higher than normal for a short period of time:  When your blood pressure is higher when you are in a health care provider's office than when you are at home, this is called white coat hypertension. Most people with this condition do not need medicines.  When your blood pressure is higher at home than when you are in a health care provider's office, this is called masked hypertension. Most people with this condition may need medicines to control blood pressure. If you have a high blood pressure reading during one visit or you have normal blood pressure with other  risk factors, you may be asked to:  Return on a different day to have your blood pressure checked again.  Monitor your blood pressure at home for 1 week or longer. If you are diagnosed with hypertension, you may have other blood or imaging tests to help your health care provider understand your overall risk for other conditions. How is this treated? This condition is treated by  making healthy lifestyle changes, such as eating healthy foods, exercising more, and reducing your alcohol intake. Your health care provider may prescribe medicine if lifestyle changes are not enough to get your blood pressure under control, and if:  Your systolic blood pressure is above 130.  Your diastolic blood pressure is above 80. Your personal target blood pressure may vary depending on your medical conditions, your age, and other factors. Follow these instructions at home: Eating and drinking  Eat a diet that is high in fiber and potassium, and low in sodium, added sugar, and fat. An example eating plan is called the DASH (Dietary Approaches to Stop Hypertension) diet. To eat this way: ? Eat plenty of fresh fruits and vegetables. Try to fill one half of your plate at each meal with fruits and vegetables. ? Eat whole grains, such as whole-wheat pasta, brown rice, or whole-grain bread. Fill about one fourth of your plate with whole grains. ? Eat or drink low-fat dairy products, such as skim milk or low-fat yogurt. ? Avoid fatty cuts of meat, processed or cured meats, and poultry with skin. Fill about one fourth of your plate with lean proteins, such as fish, chicken without skin, beans, eggs, or tofu. ? Avoid pre-made and processed foods. These tend to be higher in sodium, added sugar, and fat.  Reduce your daily sodium intake. Most people with hypertension should eat less than 1,500 mg of sodium a day.  Do not drink alcohol if: ? Your health care provider tells you not to drink. ? You are pregnant, may be pregnant, or are planning to become pregnant.  If you drink alcohol: ? Limit how much you use to:  0-1 drink a day for women.  0-2 drinks a day for men. ? Be aware of how much alcohol is in your drink. In the [place] one drink equals one 12 oz bottle of beer (355 mL), one 5 oz glass of wine (148 mL), or one 1 oz glass of hard liquor (44 mL).   Lifestyle  Work with your health  care provider to maintain a healthy body weight or to lose weight. Ask what an ideal weight is for you.  Get at least 30 minutes of exercise most days of the week. Activities may include walking, swimming, or biking.  Include exercise to strengthen your muscles (resistance exercise), such as Pilates or lifting weights, as part of your weekly exercise routine. Try to do these types of exercises for 30 minutes at least 3 days a week.  Do not use any products that contain nicotine or tobacco, such as cigarettes, e-cigarettes, and chewing tobacco. If you need help quitting, ask your health care provider.  Monitor your blood pressure at home as told by your health care provider.  Keep all follow-up visits as told by your health care provider. This is important.   Medicines  Take over-the-counter and prescription medicines only as told by your health care provider. Follow directions carefully. Blood pressure medicines must be taken as prescribed.  Do not skip doses of blood pressure medicine. Doing this puts you at  risk for problems and can make the medicine less effective.  Ask your health care provider about side effects or reactions to medicines that you should watch for. Contact a health care provider if you:  Think you are having a reaction to a medicine you are taking.  Have headaches that keep coming back (recurring).  Feel dizzy.  Have swelling in your ankles.  Have trouble with your vision. Get help right away if you:  Develop a severe headache or confusion.  Have unusual weakness or numbness.  Feel faint.  Have severe pain in your chest or abdomen.  Vomit repeatedly.  Have trouble breathing. Summary  Hypertension is when the force of blood pumping through your arteries is too strong. If this condition is not controlled, it may put you at risk for serious complications.  Your personal target blood pressure may vary depending on your medical conditions, your age, and  other factors. For most people, a normal blood pressure is less than 120/80.  Hypertension is treated with lifestyle changes, medicines, or a combination of both. Lifestyle changes include losing weight, eating a healthy, low-sodium diet, exercising more, and limiting alcohol. This information is not intended to replace advice given to you by your health care provider. Make sure you discuss any questions you have with your health care provider. Document Revised: 08/02/2018 Document Reviewed: 08/02/2018 Elsevier Patient Education  2021 [place], 44(Suppl 1), [phone]. https://[email or web address]/https://[email or web address]/[email or web address]-[email or web address]">  Prediabetes Prediabetes is when your blood sugar (blood glucose) level is higher than normal but not high enough for you to be diagnosed with type 2 diabetes. Having prediabetes puts you at risk for developing type 2 diabetes (type 2 diabetes mellitus). With certain lifestyle changes, you may be able to prevent or delay the onset of type 2 diabetes. This is important because type 2 diabetes can lead to serious complications, such as:  Heart disease.  Stroke.  Blindness.  Kidney disease.  Depression.  Poor circulation in the feet and legs. In severe cases, this could lead to surgical removal of a leg (amputation). What are the causes? The exact cause of prediabetes is not known. It may result from insulin resistance. Insulin resistance develops when cells in the body do not respond properly to insulin that the body makes. This can cause excess glucose to build up in the blood. High blood glucose (hyperglycemia) can develop. What increases the risk? The following factors may make you more likely to develop this condition:  You have a family member with type 2 diabetes.  You are older than 45 years.  You had a temporary form of diabetes during a pregnancy (gestational diabetes).  You had polycystic ovary syndrome (PCOS).  You are overweight or  obese.  You are inactive (sedentary).  You have a history of heart disease, including problems with cholesterol levels, high levels of blood fats, or high blood pressure. What are the signs or symptoms? You may have no symptoms. If you do have symptoms, they may include:  Increased hunger.  Increased thirst.  Increased urination.  Vision changes, such as blurry vision.  Tiredness (fatigue). How is this diagnosed? This condition can be diagnosed with blood tests. Your blood glucose may be checked with one or more of the following tests:  A fasting blood glucose (FBG) test. You will not be allowed to eat (you will fast) for at least 8 hours before a blood sample is taken.  An A1C blood test (hemoglobin A1C). This test provides information about blood glucose  levels over the previous 2?3 months.  An oral glucose tolerance test (OGTT). This test measures your blood glucose at two points in time: ? After fasting. This is your baseline level. ? Two hours after you drink a beverage that contains glucose. You may be diagnosed with prediabetes if:  Your FBG is 100?125 mg/dL (5.6-6.9 mmol/L).  Your A1C level is 5.7?6.4% (39-46 mmol/mol).  Your OGTT result is 140?199 mg/dL (7.8-11 mmol/L). These blood tests may be repeated to confirm your diagnosis.   How is this treated? Treatment may include dietary and lifestyle changes to help lower your blood glucose and prevent type 2 diabetes from developing. In some cases, medicine may be prescribed to help lower the risk of type 2 diabetes. Follow these instructions at home: Nutrition  Follow a healthy meal plan. This includes eating lean proteins, whole grains, legumes, fresh fruits and vegetables, low-fat dairy products, and healthy fats.  Follow instructions from your health care provider about eating or drinking restrictions.  Meet with a dietitian to create a healthy eating plan that is right for you.   Lifestyle  Do  moderate-intensity exercise for at least 30 minutes a day on 5 or more days each week, or as told by your health care provider. A mix of activities may be best, such as: ? Brisk walking, swimming, biking, and weight lifting.  Lose weight as told by your health care provider. Losing 5-7% of your body weight can reverse insulin resistance.  Do not drink alcohol if: ? Your health care provider tells you not to drink. ? You are pregnant, may be pregnant, or are planning to become pregnant.  If you drink alcohol: ? Limit how much you use to:  0-1 drink a day for women.  0-2 drinks a day for men. ? Be aware of how much alcohol is in your drink. In the [place] one drink equals one 12 oz bottle of beer (355 mL), one 5 oz glass of wine (148 mL), or one 1 oz glass of hard liquor (44 mL). General instructions  Take over-the-counter and prescription medicines only as told by your health care provider. You may be prescribed medicines that help lower the risk of type 2 diabetes.  Do not use any products that contain nicotine or tobacco, such as cigarettes, e-cigarettes, and chewing tobacco. If you need help quitting, ask your health care provider.  Keep all follow-up visits. This is important. Where to find more information  American Diabetes Association: www.diabetes.org  Academy of Nutrition and Dietetics: www.eatright.org  American Heart Association: www.heart.org Contact a health care provider if:  You have any of these symptoms: ? Increased hunger. ? Increased urination. ? Increased thirst. ? Fatigue. ? Vision changes, such as blurry vision. Get help right away if you:  Have shortness of breath.  Feel confused.  Vomit or feel like you may vomit. Summary  Prediabetes is when your blood sugar (blood glucose)level is higher than normal but not high enough for you to be diagnosed with type 2 diabetes.  Having prediabetes puts you at risk for developing type 2 diabetes (type 2  diabetes mellitus).  Make lifestyle changes such as eating a healthy diet and exercising regularly to help prevent diabetes. Lose weight as told by your health care provider. This information is not intended to replace advice given to you by your health care provider. Make sure you discuss any questions you have with your health care provider. Document Revised: 02/21/2020 Document Reviewed: 02/21/2020 Elsevier Patient Education  [place]. High Cholesterol  High cholesterol is a condition in which the blood has high levels of a white, waxy substance similar to fat (cholesterol). The liver makes all the cholesterol that the body needs. The human body needs small amounts of cholesterol to help build cells. A person gets extra or excess cholesterol from the food that he or she eats. The blood carries cholesterol from the liver to the rest of the body. If you have high cholesterol, deposits (plaques) may build up on the walls of your arteries. Arteries are the blood vessels that carry blood away from your heart. These plaques make the arteries narrow and stiff. Cholesterol plaques increase your risk for heart attack and stroke. Work with your health care provider to keep your cholesterol levels in a healthy range. What increases the risk? The following factors may make you more likely to develop this condition:  Eating foods that are high in animal fat (saturated fat) or cholesterol.  Being overweight.  Not getting enough exercise.  A family history of high cholesterol (familial hypercholesterolemia).  Use of tobacco products.  Having diabetes. What are the signs or symptoms? There are no symptoms of this condition. How is this diagnosed? This condition may be diagnosed based on the results of a blood test.  If you are older than 78 years of age, your health care provider may check your cholesterol levels every 4-6 years.  You may be checked more often if you have high  cholesterol or other risk factors for heart disease. The blood test for cholesterol measures:  "Bad" cholesterol, or LDL cholesterol. This is the main type of cholesterol that causes heart disease. The desired level is less than 100 mg/dL.  "Good" cholesterol, or HDL cholesterol. HDL helps protect against heart disease by cleaning the arteries and carrying the LDL to the liver for processing. The desired level for HDL is 60 mg/dL or higher.  Triglycerides. These are fats that your body can store or burn for energy. The desired level is less than 150 mg/dL.  Total cholesterol. This measures the total amount of cholesterol in your blood and includes LDL, HDL, and triglycerides. The desired level is less than 200 mg/dL. How is this treated? This condition may be treated with:  Diet changes. You may be asked to eat foods that have more fiber and less saturated fats or added sugar.  Lifestyle changes. These may include regular exercise, maintaining a healthy weight, and quitting use of tobacco products.  Medicines. These are given when diet and lifestyle changes have not worked. You may be prescribed a statin medicine to help lower your cholesterol levels. Follow these instructions at home: Eating and drinking  Eat a healthy, balanced diet. This diet includes: ? Daily servings of a variety of fresh, frozen, or canned fruits and vegetables. ? Daily servings of whole grain foods that are rich in fiber. ? Foods that are low in saturated fats and trans fats. These include poultry and fish without skin, lean cuts of meat, and low-fat dairy products. ? A variety of fish, especially oily fish that contain omega-3 fatty acids. Aim to eat fish at least 2 times a week.  Avoid foods and drinks that have added sugar.  Use healthy cooking methods, such as roasting, grilling, broiling, baking, poaching, steaming, and stir-frying. Do not fry your food except for stir-frying.   Lifestyle  Get regular  exercise. Aim to exercise for a total of 150 minutes a week. Increase your activity  level by doing activities such as gardening, walking, and taking the stairs.  Do not use any products that contain nicotine or tobacco, such as cigarettes, e-cigarettes, and chewing tobacco. If you need help quitting, ask your health care provider.   General instructions  Take over-the-counter and prescription medicines only as told by your health care provider.  Keep all follow-up visits as told by your health care provider. This is important. Where to find more information  American Heart Association: www.heart.org  National Heart, Lung, and Blood Institute: https://wilson-[place].com/ Contact a health care provider if:  You have trouble achieving or maintaining a healthy diet or weight.  You are starting an exercise program.  You are unable to stop smoking. Get help right away if:  You have chest pain.  You have trouble breathing.  You have any symptoms of a stroke. "BE FAST" is an easy way to remember the main warning signs of a stroke: ? B - Balance. Signs are dizziness, sudden trouble walking, or loss of balance. ? E - Eyes. Signs are trouble seeing or a sudden change in vision. ? F - Face. Signs are sudden weakness or numbness of the face, or the face or eyelid drooping on one side. ? A - Arms. Signs are weakness or numbness in an arm. This happens suddenly and usually on one side of the body. ? S - Speech. Signs are sudden trouble speaking, slurred speech, or trouble understanding what people say. ? T - Time. Time to call emergency services. Write down what time symptoms started.  You have other signs of a stroke, such as: ? A sudden, severe headache with no known cause. ? Nausea or vomiting. ? Seizure. These symptoms may represent a serious problem that is an emergency. Do not wait to see if the symptoms will go away. Get medical help right away. Call your local emergency services (911 in the  [place] Do not drive yourself to the hospital. Summary  Cholesterol plaques increase your risk for heart attack and stroke. Work with your health care provider to keep your cholesterol levels in a healthy range.  Eat a healthy, balanced diet, get regular exercise, and maintain a healthy weight.  Do not use any products that contain nicotine or tobacco, such as cigarettes, e-cigarettes, and chewing tobacco.  Get help right away if you have any symptoms of a stroke. This information is not intended to replace advice given to you by your health care provider. Make sure you discuss any questions you have with your health care provider. Document Revised: 10/22/2019 Document Reviewed: 10/22/2019 Elsevier Patient Education  2021 [employer].

## 2021-04-13 NOTE — Progress Notes (Signed)
Established patient visit   Patient: Carrie Wheeler   DOB: [birth year]   80 y.o. Female  MRN: [id number] Visit Date: 04/13/2021  Today's healthcare provider: [name], MD   Chief Complaint  Patient presents with  . Hyperlipidemia  . Hypertension  . Hyperglycemia   Subjective    Hyperlipidemia Pertinent negatives include no chest pain or shortness of breath.  Hypertension Pertinent negatives include no chest pain, headaches, palpitations or shortness of breath.  Hyperglycemia Pertinent negatives include no abdominal pain, chest pain, chills, congestion, coughing, fatigue, fever, headaches, nausea, numbness, sore throat or vomiting.    Hypertension, follow-up  BP Readings from Last 3 Encounters:  04/13/21 116/75  10/14/20 138/68  10/14/20 138/68   Wt Readings from Last 3 Encounters:  04/13/21 196 lb (88.9 kg)  10/14/20 199 lb (90.3 kg)  10/14/20 199 lb 6.4 oz (90.4 kg)     She was last seen for hypertension 6 months ago.  BP at that visit was 138/68. Management since that visit includes no changes.  She reports excellent compliance with treatment. She is not having side effects.  She is following a Low Sodium diet. She is exercising. She does not smoke.  Use of agents associated with hypertension: none.   Outside blood pressures are not being checked at home. Symptoms: No chest pain No chest pressure  No palpitations No syncope  No dyspnea No orthopnea  No paroxysmal nocturnal dyspnea No lower extremity edema    Pertinent labs: Lab Results  Component Value Date   CHOL 139 10/14/2020   HDL 52 10/14/2020   LDLCALC 76 10/14/2020   TRIG 49 10/14/2020   CHOLHDL 2.7 10/14/2020   Lab Results  Component Value Date   NA 141 10/14/2020   K 4.4 02/24/2021   CREATININE 0.79 10/14/2020   GFRNONAA 73 10/14/2020   GFRAA 84 10/14/2020   GLUCOSE 92 10/14/2020     The 10-year ASCVD risk score Mikey Bussing DC Jr., et al., 2013) is: 21.4%    --------------------------------------------------------------------------------------------------- Lipid/Cholesterol, Follow-up  Last lipid panel Other pertinent labs  Lab Results  Component Value Date   CHOL 139 10/14/2020   HDL 52 10/14/2020   LDLCALC 76 10/14/2020   TRIG 49 10/14/2020   CHOLHDL 2.7 10/14/2020   Lab Results  Component Value Date   ALT 12 10/14/2020   AST 16 10/14/2020   PLT 256 10/05/2018   TSH 2.590 08/30/2016     She was last seen for this 6 months ago.  Management since that visit includes decrease to QOD due to myalgias.  She reports excellent compliance with treatment. She is not having side effects.   Symptoms: No chest pain No chest pressure/discomfort  No dyspnea No lower extremity edema  No numbness or tingling of extremity No orthopnea  No palpitations No paroxysmal nocturnal dyspnea  No speech difficulty No syncope   Current diet: in general, a "healthy" diet   Current exercise: yard work  The 10-year ASCVD risk score Mikey Bussing DC Jr., et al., 2013) is: 21.4%  --------------------------------------------------------------------------------------------------- Prediabetes, Follow-up  Lab Results  Component Value Date   HGBA1C 5.4 04/13/2021   HGBA1C 5.8 (H) 10/14/2020   GLUCOSE 92 10/14/2020   GLUCOSE 95 04/10/2020   GLUCOSE 96 06/18/2019    Last seen for this 6 months ago.  Management since that visit includes no changes. Current symptoms include none and have been stable.   Uterine Prolapse She states that when she finished using the bathroom, when she  began to wipe she noticed that "her uterus was misplaced." She states that this has never happened before. She states that in the past she gave birth to one child and it was a full breach pregnancy.    Pertinent Labs:    Component Value Date/Time   CHOL 139 10/14/2020 1034   TRIG 49 10/14/2020 1034   CHOLHDL 2.7 10/14/2020 1034   CREATININE 0.79 10/14/2020 1034   CREATININE  0.67 03/02/2015 1207    Wt Readings from Last 3 Encounters:  04/13/21 196 lb (88.9 kg)  10/14/20 199 lb (90.3 kg)  10/14/20 199 lb 6.4 oz (90.4 kg)    -----------------------------------------------------------------------------------------   Patient Active Problem List   Diagnosis Date Noted  . Prediabetes 04/13/2021  . Female genital prolapse 04/13/2021  . Myalgia due to statin 10/14/2020  . Neuropathy 04/10/2020  . GERD (gastroesophageal reflux disease) 10/05/2018  . Blurry vision 06/23/2018  . Benign neoplasm of ascending colon   . Benign neoplasm of transverse colon   . Essential (primary) hypertension 03/31/2015  . Morbid obesity ([place]) 03/31/2015  . Hyperlipidemia 01/02/2010  . Avitaminosis D 12/11/2008  . Family history of cancer of digestive system 10/31/2006  . Allergic rhinitis 12/20/2001   Social History   Tobacco Use  . Smoking status: Never Smoker  . Smokeless tobacco: Never Used  Vaping Use  . Vaping Use: Never used  Substance Use Topics  . 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 Drug use: No   No Known Allergies     Medications: Outpatient Medications Prior to Visit  Medication Sig  . amLODipine (NORVASC) 10 MG tablet Take 1 tablet (10 mg total) by mouth daily.  Marland Kitchen aspirin (ASPIRIN CHILDRENS) 81 MG chewable tablet Chew 1 tablet (81 mg total) by mouth daily.  Marland Kitchen atorvastatin (LIPITOR) 40 MG tablet Take 1 tablet (40 mg total) by mouth every other day.  Marland Kitchen BIOTIN PO Take 2 tablets by mouth daily. Unsure dose  . calcium carbonate (OS-CAL) 600 MG TABS tablet Take 1 tablet by mouth daily.  . cetirizine (ZYRTEC) 10 MG tablet Take 1 tablet by mouth daily. As needed  . Cholecalciferol (VITAMIN D) 2000 UNITS CAPS Take by mouth daily.   . fluticasone (FLONASE) 50 MCG/ACT nasal spray SHAKE LIQUID AND USE 2 SPRAYS IN EACH NOSTRIL EVERY DAY  . Lactobacillus (PROBIOTIC ACIDOPHILUS PO) Take 1 tablet by mouth daily.  .  Tetrahydrozoline HCl (VISINE RED EYE COMFORT OP) Place 1 drop into both eyes daily.   No facility-administered medications prior to visit.    Review of Systems  Constitutional: Negative.  Negative for activity change, appetite change, chills, fatigue and fever.  HENT: Negative for congestion, ear pain, rhinorrhea, sinus pain and sore throat.   Respiratory: Negative.  Negative for cough, chest tightness, shortness of breath and wheezing.   Cardiovascular: Negative.  Negative for chest pain, palpitations and leg swelling.  Gastrointestinal: Negative.  Negative for abdominal pain, blood in stool, diarrhea, nausea and vomiting.  Genitourinary: Negative for dysuria, flank pain, frequency and urgency.  Neurological: Negative for dizziness, light-headedness, numbness and headaches.       Objective    BP 116/75 (BP Location: Left Arm, Patient Position: Sitting, Cuff Size: Large)   Pulse 67   Temp 98.1 F (36.7 C) (Oral)   Wt 196 lb (88.9 kg)   BMI 38.28 kg/m  BP Readings from Last 3 Encounters:  04/13/21 116/75  10/14/20  138/68  10/14/20 138/68   Wt Readings from Last 3 Encounters:  04/13/21 196 lb (88.9 kg)  10/14/20 199 lb (90.3 kg)  10/14/20 199 lb 6.4 oz (90.4 kg)      Physical Exam Vitals reviewed.  Constitutional:      General: She is not in acute distress.    Appearance: Normal appearance. She is well-developed. She is not diaphoretic.  HENT:     Head: Normocephalic and atraumatic.  Eyes:     General: No scleral icterus.    Conjunctiva/sclera: Conjunctivae normal.  Neck:     Thyroid: No thyromegaly.  Cardiovascular:     Rate and Rhythm: Normal rate and regular rhythm.     Pulses: Normal pulses.     Heart sounds: Normal heart sounds. No murmur heard.   Pulmonary:     Effort: Pulmonary effort is normal. No respiratory distress.     Breath sounds: Normal breath sounds. No wheezing, rhonchi or rales.  Musculoskeletal:     Cervical back: Neck supple.     Right  lower leg: No edema.     Left lower leg: No edema.  Lymphadenopathy:     Cervical: No cervical adenopathy.  Skin:    General: Skin is warm and dry.     Findings: No rash.  Neurological:     Mental Status: She is alert and oriented to person, place, and time. Mental status is at baseline.  Psychiatric:        Mood and Affect: Mood normal.        Behavior: Behavior normal.       Results for orders placed or performed in visit on 04/13/21  POCT glycosylated hemoglobin (Hb A1C)  Result Value Ref Range   Hemoglobin A1C 5.4 4.0 - 5.6 %    Assessment & Plan     Problem List Items Addressed This Visit      Cardiovascular and Mediastinum   Essential (primary) hypertension - Primary    Well controlled Continue current medications Recheck metabolic panel F/u in 6 months        Genitourinary   Female genital prolapse    Unable to perform GU exam today due to injury Patient describes prolapse Referral to GYN for further evaluation and management      Relevant Orders   Ambulatory referral to Obstetrics / Gynecology     Other   Hyperlipidemia    Previously well controlled Continue statin every other day dosing Goal LDL less than 100 Repeat FLP and CMP at next visit      Morbid obesity ([place])    BMI 38 and associated with HTN and HLD Discussed importance of healthy weight management Discussed diet and exercise       Prediabetes    Well-controlled today Encourage low-carb diet      Relevant Orders   POCT glycosylated hemoglobin (Hb A1C) (Completed)       Return in about 6 months (around 10/14/2021) for AWV, CPE with Dr Jacinto Reap.       Frederic Jericho [place],acting as a scribe for [name], MD.,have documented all relevant documentation on the behalf of [name], MD,as directed by  [name], MD while in the presence of [name], MD.   I, [name], MD, have reviewed all documentation for this visit. The documentation on  04/13/21 for the exam, diagnosis, procedures, and orders are all accurate and complete.   Henryk Ursin, Dionne Bucy, MD, MPH [facility]

## 2021-04-13 NOTE — Assessment & Plan Note (Signed)
BMI 38 and associated with HTN and HLD Discussed importance of healthy weight management Discussed diet and exercise

## 2021-04-13 NOTE — Assessment & Plan Note (Signed)
Well-controlled today Encourage low-carb diet

## 2021-04-13 NOTE — Assessment & Plan Note (Signed)
Well controlled Continue current medications Recheck metabolic panel F/u in 6 months  

## 2021-04-13 NOTE — Telephone Encounter (Signed)
[facility] referring for Female genital prolapse, unspecified type with Dr. Gilman Schmidt. Called and left voicemail for patient to call back to be scheduled.

## 2021-04-13 NOTE — Assessment & Plan Note (Signed)
Previously well controlled Continue statin every other day dosing Goal LDL less than 100 Repeat FLP and CMP at next visit

## 2021-04-13 NOTE — Telephone Encounter (Signed)
Pt. Returned your call at 1:15 on 5/9.

## 2021-04-13 NOTE — Assessment & Plan Note (Signed)
Unable to perform GU exam today due to injury Patient describes prolapse Referral to GYN for further evaluation and management

## 2021-04-22 ENCOUNTER — Ambulatory Visit (INDEPENDENT_AMBULATORY_CARE_PROVIDER_SITE_OTHER): Payer: Medicare Other | Admitting: Gastroenterology

## 2021-04-22 ENCOUNTER — Encounter: Payer: Self-pay | Admitting: Gastroenterology

## 2021-04-22 ENCOUNTER — Other Ambulatory Visit: Payer: Self-pay

## 2021-04-22 VITALS — BP 136/69 | HR 88 | Temp 97.2°F | Ht 60.0 in | Wt 198.8 lb

## 2021-04-22 DIAGNOSIS — R1319 Other dysphagia: Secondary | ICD-10-CM | POA: Diagnosis not present

## 2021-04-22 DIAGNOSIS — Z86010 Personal history of colonic polyps: Secondary | ICD-10-CM

## 2021-04-22 MED ORDER — SUPREP BOWEL PREP KIT 17.5-3.13-1.6 GM/177ML PO SOLN: 1.0000 | ORAL | 0 refills | Status: DC

## 2021-04-22 NOTE — Progress Notes (Signed)
Gastroenterology Consultation  Referring Provider:     Virginia Crews, MD Primary Care Physician:  Virginia Crews, MD Primary Gastroenterologist:  Dr. Allen Norris     Reason for Consultation:     Dysphagia and history of colon polyps        HPI:   Carrie Wheeler is a 80 y.o. y/o female referred for consultation & management of dysphagia and history of colon polyps by Dr. Brita Romp, Dionne Bucy, MD.  This patient comes in after being seen in 2016 for colonoscopy and had 4 polyps removed that were adenomatous.  The patient was told to repeat a colonoscopy in 5 years.  The patient now comes in with a history of dysphagia.  The patient was seen back in November by her primary care provider and a referral was made to me for the history of polyps. The patient reports that her problems with swallowing were much worse in the past but now she states that she gets food stuck and has to vomit up only once a month once every 2 months.  There is no report of any unexplained weight loss fevers chills black stools or bloody stools.  The patient does have constipation which has resulted in some vaginal prolapse and she also reports it to have caused some bladder problems.  There is no report of any abdominal pain.  Past Medical History:  Diagnosis Date  . Environmental allergies   . GERD (gastroesophageal reflux disease)    occasional  . Hyperlipidemia   . Hypertension     Past Surgical History:  Procedure Laterality Date  . COLONOSCOPY    . COLONOSCOPY WITH PROPOFOL N/A 11/27/2015   Procedure: COLONOSCOPY WITH PROPOFOL;  Surgeon: Lucilla Lame, MD;  Location: [facility];  Service: Endoscopy;  Laterality: N/A;  . TONSILLECTOMY      Prior to Admission medications   Medication Sig Start Date End Date Taking? Authorizing Provider  amLODipine (NORVASC) 10 MG tablet Take 1 tablet (10 mg total) by mouth daily. 03/16/21   Virginia Crews, MD  aspirin (ASPIRIN CHILDRENS) 81 MG chewable  tablet Chew 1 tablet (81 mg total) by mouth daily. 09/19/15   Margarita Rana, MD  atorvastatin (LIPITOR) 40 MG tablet Take 1 tablet (40 mg total) by mouth every other day. 10/14/20   Virginia Crews, MD  BIOTIN PO Take 2 tablets by mouth daily. Unsure dose    [provider]  calcium carbonate (OS-CAL) 600 MG TABS tablet Take 1 tablet by mouth daily.    [provider]  cetirizine (ZYRTEC) 10 MG tablet Take 1 tablet by mouth daily. As needed 01/02/10   [provider]  Cholecalciferol (VITAMIN D) 2000 UNITS CAPS Take by mouth daily.     [provider]  fluticasone (FLONASE) 50 MCG/ACT nasal spray SHAKE LIQUID AND USE 2 SPRAYS IN EACH NOSTRIL EVERY DAY 05/07/20   Bacigalupo, Dionne Bucy, MD  Lactobacillus (PROBIOTIC ACIDOPHILUS PO) Take 1 tablet by mouth daily.    [provider]  Tetrahydrozoline HCl (VISINE RED EYE COMFORT OP) Place 1 drop into both eyes daily.    [provider]    Family History  Problem Relation Age of Onset  . Hypertension Father   . Colon cancer Father   . Alzheimer's disease Other   . Diabetes Other   . Colon cancer Other   . Liver disease Son   . Diabetes Mother   . Heart Problems Brother  Congential heart defect  . Healthy Brother      Social History   Tobacco Use  . Smoking status: Never Smoker  . Smokeless tobacco: Never Used  Vaping Use  . Vaping Use: Never used  Substance Use Topics  . 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 Drug use: No    Allergies as of 04/22/2021  . (No Known Allergies)    Review of Systems:    All systems reviewed and negative except where noted in HPI.   Physical Exam:  There were no vitals taken for this visit. No LMP recorded. Patient is postmenopausal. General:   Alert,  Well-developed, well-nourished, pleasant and cooperative in NAD Head:  Normocephalic and atraumatic. Eyes:  Sclera clear, no icterus.    Conjunctiva pink. Ears:  Normal auditory acuity. Neck:  Supple; no masses or thyromegaly. Lungs:  Respirations even and unlabored.  Clear throughout to auscultation.   No wheezes, crackles, or rhonchi. No acute distress. Heart:  Regular rate and rhythm; no murmurs, clicks, rubs, or gallops. Abdomen:  Normal bowel sounds.  No bruits.  Soft, non-tender and non-distended without masses, hepatosplenomegaly or hernias noted.  No guarding or rebound tenderness.  Negative Carnett sign.   Rectal:  Deferred.  Pulses:  Normal pulses noted. Extremities:  No clubbing or edema.  No cyanosis. Neurologic:  Alert and oriented x3;  grossly normal neurologically. Skin:  Intact without significant lesions or rashes.  No jaundice. Lymph Nodes:  No significant cervical adenopathy. Psych:  Alert and cooperative. Normal mood and affect.  Imaging Studies: No results found.  Assessment and Plan:   RAELEY GILMORE is a 80 y.o. y/o female who comes in today with a history of colon polyps and she reports that her father had rectal cancer in his 66s.  The patient had a colonoscopy in 2016 with 4 adenomatous polyps removed.  The patient has been told about the pathogenesis of colon polyps and colon cancer and would like to proceed with a colonoscopy.  The patient will also be set up for an upper endoscopy due to her issues with food getting stuck in her esophagus that needs to be brought up by vomiting it out.  The patient has been explained the plan and agrees with it.    Lucilla Lame, MD. [facility]    Note: This dictation was prepared with Dragon dictation along with smaller phrase technology. Any transcriptional errors that result from this process are unintentional.

## 2021-04-22 NOTE — H&P (View-Only) (Signed)
Gastroenterology Consultation  Referring Provider:     Virginia Crews, MD Primary Care Physician:  Virginia Crews, MD Primary Gastroenterologist:  Dr. Allen Norris     Reason for Consultation:     Dysphagia and history of colon polyps        HPI:   Carrie Wheeler is a 80 y.o. y/o female referred for consultation & management of dysphagia and history of colon polyps by Dr. Brita Romp, Dionne Bucy, MD.  This patient comes in after being seen in 2016 for colonoscopy and had 4 polyps removed that were adenomatous.  The patient was told to repeat a colonoscopy in 5 years.  The patient now comes in with a history of dysphagia.  The patient was seen back in November by her primary care provider and a referral was made to me for the history of polyps. The patient reports that her problems with swallowing were much worse in the past but now she states that she gets food stuck and has to vomit up only once a month once every 2 months.  There is no report of any unexplained weight loss fevers chills black stools or bloody stools.  The patient does have constipation which has resulted in some vaginal prolapse and she also reports it to have caused some bladder problems.  There is no report of any abdominal pain.  Past Medical History:  Diagnosis Date  . Environmental allergies   . GERD (gastroesophageal reflux disease)    occasional  . Hyperlipidemia   . Hypertension     Past Surgical History:  Procedure Laterality Date  . COLONOSCOPY    . COLONOSCOPY WITH PROPOFOL N/A 11/27/2015   Procedure: COLONOSCOPY WITH PROPOFOL;  Surgeon: Lucilla Lame, MD;  Location: [facility];  Service: Endoscopy;  Laterality: N/A;  . TONSILLECTOMY      Prior to Admission medications   Medication Sig Start Date End Date Taking? Authorizing Provider  amLODipine (NORVASC) 10 MG tablet Take 1 tablet (10 mg total) by mouth daily. 03/16/21   Virginia Crews, MD  aspirin (ASPIRIN CHILDRENS) 81 MG chewable  tablet Chew 1 tablet (81 mg total) by mouth daily. 09/19/15   Margarita Rana, MD  atorvastatin (LIPITOR) 40 MG tablet Take 1 tablet (40 mg total) by mouth every other day. 10/14/20   Virginia Crews, MD  BIOTIN PO Take 2 tablets by mouth daily. Unsure dose    [provider]  calcium carbonate (OS-CAL) 600 MG TABS tablet Take 1 tablet by mouth daily.    [provider]  cetirizine (ZYRTEC) 10 MG tablet Take 1 tablet by mouth daily. As needed 01/02/10   [provider]  Cholecalciferol (VITAMIN D) 2000 UNITS CAPS Take by mouth daily.     [provider]  fluticasone (FLONASE) 50 MCG/ACT nasal spray SHAKE LIQUID AND USE 2 SPRAYS IN EACH NOSTRIL EVERY DAY 05/07/20   Bacigalupo, Dionne Bucy, MD  Lactobacillus (PROBIOTIC ACIDOPHILUS PO) Take 1 tablet by mouth daily.    [provider]  Tetrahydrozoline HCl (VISINE RED EYE COMFORT OP) Place 1 drop into both eyes daily.    [provider]    Family History  Problem Relation Age of Onset  . Hypertension Father   . Colon cancer Father   . Alzheimer's disease Other   . Diabetes Other   . Colon cancer Other   . Liver disease Son   . Diabetes Mother   . Heart Problems Brother  Congential heart defect  . Healthy Brother      Social History   Tobacco Use  . Smoking status: Never Smoker  . Smokeless tobacco: Never Used  Vaping Use  . Vaping Use: Never used  Substance Use Topics  . 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 Drug use: No    Allergies as of 04/22/2021  . (No Known Allergies)    Review of Systems:    All systems reviewed and negative except where noted in HPI.   Physical Exam:  There were no vitals taken for this visit. No LMP recorded. Patient is postmenopausal. General:   Alert,  Well-developed, well-nourished, pleasant and cooperative in NAD Head:  Normocephalic and atraumatic. Eyes:  Sclera clear, no icterus.    Conjunctiva pink. Ears:  Normal auditory acuity. Neck:  Supple; no masses or thyromegaly. Lungs:  Respirations even and unlabored.  Clear throughout to auscultation.   No wheezes, crackles, or rhonchi. No acute distress. Heart:  Regular rate and rhythm; no murmurs, clicks, rubs, or gallops. Abdomen:  Normal bowel sounds.  No bruits.  Soft, non-tender and non-distended without masses, hepatosplenomegaly or hernias noted.  No guarding or rebound tenderness.  Negative Carnett sign.   Rectal:  Deferred.  Pulses:  Normal pulses noted. Extremities:  No clubbing or edema.  No cyanosis. Neurologic:  Alert and oriented x3;  grossly normal neurologically. Skin:  Intact without significant lesions or rashes.  No jaundice. Lymph Nodes:  No significant cervical adenopathy. Psych:  Alert and cooperative. Normal mood and affect.  Imaging Studies: No results found.  Assessment and Plan:   Carrie Wheeler is a 80 y.o. y/o female who comes in today with a history of colon polyps and she reports that her father had rectal cancer in his 66s.  The patient had a colonoscopy in 2016 with 4 adenomatous polyps removed.  The patient has been told about the pathogenesis of colon polyps and colon cancer and would like to proceed with a colonoscopy.  The patient will also be set up for an upper endoscopy due to her issues with food getting stuck in her esophagus that needs to be brought up by vomiting it out.  The patient has been explained the plan and agrees with it.    Lucilla Lame, MD. [facility]al    Note: This dictation was prepared with Dragon dictation along with smaller phrase technology. Any transcriptional errors that result from this process are unintentional.

## 2021-04-28 ENCOUNTER — Encounter: Payer: Self-pay | Admitting: Obstetrics and Gynecology

## 2021-05-06 ENCOUNTER — Encounter: Payer: Self-pay | Admitting: Gastroenterology

## 2021-05-18 ENCOUNTER — Other Ambulatory Visit: Payer: Self-pay | Admitting: Gastroenterology

## 2021-05-18 ENCOUNTER — Other Ambulatory Visit: Payer: Self-pay

## 2021-05-18 ENCOUNTER — Encounter: Payer: Self-pay | Admitting: Gastroenterology

## 2021-05-18 ENCOUNTER — Encounter
Admission: RE | Disposition: A | Discharge status: Home / Self Care | Payer: Self-pay | Source: Home / Self Care | Attending: Gastroenterology

## 2021-05-18 ENCOUNTER — Ambulatory Visit: Payer: Medicare Other | Admitting: Anesthesiology

## 2021-05-18 ENCOUNTER — Ambulatory Visit
Admission: RE | Admit: 2021-05-18 | Discharge: 2021-05-18 | Disposition: A | Discharge status: Home / Self Care | Payer: Medicare Other | Attending: Gastroenterology | Admitting: Gastroenterology

## 2021-05-18 DIAGNOSIS — D123 Benign neoplasm of transverse colon: Secondary | ICD-10-CM | POA: Insufficient documentation

## 2021-05-18 DIAGNOSIS — K635 Polyp of colon: Secondary | ICD-10-CM | POA: Diagnosis not present

## 2021-05-18 DIAGNOSIS — K641 Second degree hemorrhoids: Secondary | ICD-10-CM | POA: Diagnosis not present

## 2021-05-18 DIAGNOSIS — K449 Diaphragmatic hernia without obstruction or gangrene: Secondary | ICD-10-CM | POA: Diagnosis not present

## 2021-05-18 DIAGNOSIS — D120 Benign neoplasm of cecum: Secondary | ICD-10-CM | POA: Insufficient documentation

## 2021-05-18 DIAGNOSIS — Z860100 Personal history of colon polyps, unspecified: Secondary | ICD-10-CM

## 2021-05-18 DIAGNOSIS — R1310 Dysphagia, unspecified: Secondary | ICD-10-CM | POA: Diagnosis not present

## 2021-05-18 DIAGNOSIS — Z800 Family history of malignant neoplasm of digestive organs: Secondary | ICD-10-CM | POA: Diagnosis not present

## 2021-05-18 DIAGNOSIS — R1319 Other dysphagia: Secondary | ICD-10-CM

## 2021-05-18 DIAGNOSIS — Z833 Family history of diabetes mellitus: Secondary | ICD-10-CM | POA: Diagnosis not present

## 2021-05-18 DIAGNOSIS — Z8249 Family history of ischemic heart disease and other diseases of the circulatory system: Secondary | ICD-10-CM | POA: Insufficient documentation

## 2021-05-18 DIAGNOSIS — D124 Benign neoplasm of descending colon: Secondary | ICD-10-CM | POA: Diagnosis not present

## 2021-05-18 DIAGNOSIS — K2100 Gastro-esophageal reflux disease with esophagitis, without bleeding: Secondary | ICD-10-CM

## 2021-05-18 DIAGNOSIS — Z1211 Encounter for screening for malignant neoplasm of colon: Secondary | ICD-10-CM | POA: Insufficient documentation

## 2021-05-18 DIAGNOSIS — D122 Benign neoplasm of ascending colon: Secondary | ICD-10-CM | POA: Insufficient documentation

## 2021-05-18 DIAGNOSIS — Z79899 Other long term (current) drug therapy: Secondary | ICD-10-CM | POA: Insufficient documentation

## 2021-05-18 DIAGNOSIS — K222 Esophageal obstruction: Secondary | ICD-10-CM

## 2021-05-18 HISTORY — PX: ESOPHAGOGASTRODUODENOSCOPY (EGD) WITH PROPOFOL: SHX5813

## 2021-05-18 HISTORY — PX: COLONOSCOPY WITH PROPOFOL: SHX5780

## 2021-05-18 HISTORY — PX: POLYPECTOMY: SHX5525

## 2021-05-18 SURGERY — COLONOSCOPY WITH PROPOFOL
Anesthesia: General | Site: Rectum

## 2021-05-18 MED ORDER — PANTOPRAZOLE SODIUM 40 MG PO TBEC: 40.0000 mg | DELAYED_RELEASE_TABLET | Freq: Every day | ORAL | 8 refills | Status: DC

## 2021-05-18 MED ORDER — STERILE WATER FOR IRRIGATION IR SOLN
Status: DC | PRN
  Administered 2021-05-18: 150 mL

## 2021-05-18 MED ORDER — ACETAMINOPHEN 160 MG/5ML PO SOLN: 325.0000 mg | ORAL | Status: DC | PRN

## 2021-05-18 MED ORDER — LIDOCAINE HCL (CARDIAC) PF 100 MG/5ML IV SOSY
PREFILLED_SYRINGE | INTRAVENOUS | Status: DC | PRN
  Administered 2021-05-18: 30 mg via INTRAVENOUS

## 2021-05-18 MED ORDER — ACETAMINOPHEN 325 MG PO TABS: 325.0000 mg | ORAL_TABLET | ORAL | Status: DC | PRN

## 2021-05-18 MED ORDER — GLYCOPYRROLATE 0.2 MG/ML IJ SOLN
INTRAMUSCULAR | Status: DC | PRN
  Administered 2021-05-18: .1 mg via INTRAVENOUS

## 2021-05-18 MED ORDER — LACTATED RINGERS IV SOLN: INTRAVENOUS | Status: DC

## 2021-05-18 MED ORDER — PROPOFOL 10 MG/ML IV BOLUS
INTRAVENOUS | Status: DC | PRN
  Administered 2021-05-18 (×4): 30 mg via INTRAVENOUS
  Administered 2021-05-18: 150 mg via INTRAVENOUS
  Administered 2021-05-18 (×5): 30 mg via INTRAVENOUS

## 2021-05-18 MED ORDER — SODIUM CHLORIDE 0.9 % IV SOLN: INTRAVENOUS | Status: DC

## 2021-05-18 MED ORDER — ONDANSETRON HCL 4 MG/2ML IJ SOLN: 4.0000 mg | Freq: Once | INTRAMUSCULAR | Status: DC | PRN

## 2021-05-18 SURGICAL SUPPLY — 38 items
BALLN DILATOR 10-12 8 (BALLOONS)
BALLN DILATOR 12-15 8 (BALLOONS) ×4
BALLN DILATOR 15-18 8 (BALLOONS)
BALLN DILATOR CRE 0-12 8 (BALLOONS)
BALLN DILATOR ESOPH 8 10 CRE (MISCELLANEOUS) IMPLANT
BALLOON DILATOR 12-15 8 (BALLOONS) ×2 IMPLANT
BALLOON DILATOR 15-18 8 (BALLOONS) IMPLANT
BALLOON DILATOR CRE 0-12 8 (BALLOONS) IMPLANT
BLOCK BITE 60FR ADLT L/F GRN (MISCELLANEOUS) ×4 IMPLANT
CLIP HMST 235XBRD CATH ROT (MISCELLANEOUS) IMPLANT
CLIP RESOLUTION 360 11X235 (MISCELLANEOUS)
ELECT REM PT RETURN 9FT ADLT (ELECTROSURGICAL)
ELECTRODE REM PT RTRN 9FT ADLT (ELECTROSURGICAL) IMPLANT
FCP ESCP3.2XJMB 240X2.8X (MISCELLANEOUS) ×2
FORCEPS BIOP RAD 4 LRG CAP 4 (CUTTING FORCEPS) IMPLANT
FORCEPS BIOP RJ4 240 W/NDL (MISCELLANEOUS) ×4
FORCEPS ESCP3.2XJMB 240X2.8X (MISCELLANEOUS) ×2 IMPLANT
GOWN CVR UNV OPN BCK APRN NK (MISCELLANEOUS) ×4 IMPLANT
GOWN ISOL THUMB LOOP REG UNIV (MISCELLANEOUS) ×8
INJECTOR VARIJECT VIN23 (MISCELLANEOUS) IMPLANT
KIT DEFENDO VALVE AND CONN (KITS) IMPLANT
KIT PRC NS LF DISP ENDO (KITS) ×2 IMPLANT
KIT PROCEDURE OLYMPUS (KITS) ×4
MANIFOLD NEPTUNE II (INSTRUMENTS) ×4 IMPLANT
MARKER SPOT ENDO TATTOO 5ML (MISCELLANEOUS) IMPLANT
PROBE APC STR FIRE (PROBE) IMPLANT
RETRIEVER NET PLAT FOOD (MISCELLANEOUS) IMPLANT
RETRIEVER NET ROTH 2.5X230 LF (MISCELLANEOUS) IMPLANT
SNARE COLD EXACTO (MISCELLANEOUS) ×4 IMPLANT
SNARE SHORT THROW 13M SML OVAL (MISCELLANEOUS) IMPLANT
SNARE SHORT THROW 30M LRG OVAL (MISCELLANEOUS) IMPLANT
SNARE SNG USE RND 15MM (INSTRUMENTS) IMPLANT
SPOT EX ENDOSCOPIC TATTOO (MISCELLANEOUS)
SYR INFLATION 60ML (SYRINGE) ×4 IMPLANT
TRAP ETRAP POLY (MISCELLANEOUS) ×4 IMPLANT
VARIJECT INJECTOR VIN23 (MISCELLANEOUS)
WATER STERILE IRR 250ML POUR (IV SOLUTION) ×4 IMPLANT
WIRE CRE 18-20MM 8CM F G (MISCELLANEOUS) IMPLANT

## 2021-05-18 NOTE — Op Note (Signed)
[facility] Gastroenterology Patient Name: Carrie Wheeler Procedure Date: 05/18/2021 11:46 AM MRN: [id number] Account #: [id number] Date of Birth: [birth year] Admit Type: Outpatient Age: 80 Room: [facility] OR ROOM 01 Gender: Female Note Status: Finalized Procedure:             Colonoscopy Indications:           High risk colon cancer surveillance: Personal history                         of colonic polyps Providers:             Lucilla Lame MD, MD Referring MD:          Dionne Bucy. Bacigalupo (Referring MD) Medicines:             Propofol per Anesthesia Complications:         No immediate complications. Procedure:             Pre-Anesthesia Assessment:                        - Prior to the procedure, a History and Physical was                         performed, and patient medications and allergies were                         reviewed. The patient's tolerance of previous                         anesthesia was also reviewed. The risks and benefits                         of the procedure and the sedation options and risks                         were discussed with the patient. All questions were                         answered, and informed consent was obtained. Prior                         Anticoagulants: The patient has taken no previous                         anticoagulant or antiplatelet agents. ASA Grade                         Assessment: II - A patient with mild systemic disease.                         After reviewing the risks and benefits, the patient                         was deemed in satisfactory condition to undergo the                         procedure.  After obtaining informed consent, the colonoscope was                         passed under direct vision. Throughout the procedure,                         the patient's blood pressure, pulse, and oxygen                         saturations were monitored continuously. The                          Colonoscope was introduced through the anus and                         advanced to the the cecum, identified by appendiceal                         orifice and ileocecal valve. The colonoscopy was                         performed without difficulty. The patient tolerated                         the procedure well. The quality of the bowel                         preparation was excellent. Findings:      The perianal and digital rectal examinations were normal.      Three sessile polyps were found in the cecum. The polyps were 3 to 6 mm       in size. These polyps were removed with a cold snare. Resection and       retrieval were complete.      A 7 mm polyp was found in the ascending colon. The polyp was sessile.       The polyp was removed with a cold snare. Resection and retrieval were       complete.      Twelve sessile polyps were found in the transverse colon. The polyps       were 6 to 9 mm in size. These polyps were removed with a cold snare.       Resection and retrieval were complete.      Two sessile polyps were found in the descending colon. The polyps were 3       to 6 mm in size. These polyps were removed with a cold snare. Resection       and retrieval were complete.      Non-bleeding internal hemorrhoids were found during retroflexion. The       hemorrhoids were Grade II (internal hemorrhoids that prolapse but reduce       spontaneously). Impression:            - Three 3 to 6 mm polyps in the cecum, removed with a                         cold snare. Resected and retrieved.                        - One 7 mm  polyp in the ascending colon, removed with                         a cold snare. Resected and retrieved.                        - Twelve 6 to 9 mm polyps in the transverse colon,                         removed with a cold snare. Resected and retrieved.                        - Two 3 to 6 mm polyps in the descending colon,                          removed with a cold snare. Resected and retrieved.                        - Non-bleeding internal hemorrhoids. Recommendation:        - Discharge patient to home.                        - Resume previous diet.                        - Continue present medications.                        - Await pathology results. Procedure Code(s):     --- Professional ---                        [phone], Colonoscopy, flexible; with removal of                         tumor(s), polyp(s), or other lesion(s) by snare                         technique Diagnosis Code(s):     --- Professional ---                        Z86.010, Personal history of colonic polyps                        K63.5, Polyp of colon CPT copyright 2019 American Medical Association. All rights reserved. The codes documented in this report are preliminary and upon coder review may  be revised to meet current compliance requirements. Lucilla Lame MD, MD 05/18/2021 12:26:10 PM This report has been signed electronically. Number of Addenda: 0 Note Initiated On: 05/18/2021 11:46 AM Scope Withdrawal Time: 0 hours 8 minutes 55 seconds  Total Procedure Duration: 0 hours 19 minutes 14 seconds  Estimated Blood Loss:  Estimated blood loss: none.      [facility]

## 2021-05-18 NOTE — Anesthesia Postprocedure Evaluation (Signed)
Anesthesia Post Note  Patient: Carrie Wheeler  Procedure(s) Performed: COLONOSCOPY WITH PROPOFOL (Rectum) ESOPHAGOGASTRODUODENOSCOPY (EGD) WITH PROPOFOL (Esophagus) POLYPECTOMY (Rectum)     Patient location during evaluation: PACU Anesthesia Type: General Level of consciousness: awake Pain management: pain level controlled Vital Signs Assessment: post-procedure vital signs reviewed and stable Respiratory status: respiratory function stable Cardiovascular status: stable Postop Assessment: no signs of nausea or vomiting Anesthetic complications: no   No notable events documented.  Veda Canning

## 2021-05-18 NOTE — Anesthesia Procedure Notes (Signed)
Date/Time: 05/18/2021 11:48 AM Performed by: Cameron Ali, CRNA Pre-anesthesia Checklist: Patient identified, Emergency Drugs available, Suction available, Timeout performed and Patient being monitored Patient Re-evaluated:Patient Re-evaluated prior to induction Oxygen Delivery Method: Nasal cannula Placement Confirmation: positive ETCO2

## 2021-05-18 NOTE — Anesthesia Preprocedure Evaluation (Signed)
Anesthesia Evaluation  Patient identified by MRN, date of birth, ID band Patient awake    Reviewed: Allergy & Precautions, NPO status   Airway Mallampati: II  TM Distance: >3 FB     Dental   Pulmonary    Pulmonary exam normal        Cardiovascular hypertension,  Rhythm:Regular Rate:Normal  HLD   Neuro/Psych    GI/Hepatic GERD  ,  Endo/Other  Obesity - BMI 37  Renal/GU      Musculoskeletal   Abdominal   Peds  Hematology   Anesthesia Other Findings   Reproductive/Obstetrics                             Anesthesia Physical Anesthesia Plan  ASA: 2  Anesthesia Plan: General   Post-op Pain Management:    Induction: Intravenous  PONV Risk Score and Plan: Propofol infusion, TIVA and Treatment may vary due to age or medical condition  Airway Management Planned: Natural Airway and Nasal Cannula  Additional Equipment:   Intra-op Plan:   Post-operative Plan:   Informed Consent: I have reviewed the patients History and Physical, chart, labs and discussed the procedure including the risks, benefits and alternatives for the proposed anesthesia with the patient or authorized representative who has indicated his/her understanding and acceptance.       Plan Discussed with: CRNA  Anesthesia Plan Comments:         Anesthesia Quick Evaluation

## 2021-05-18 NOTE — Interval H&P Note (Signed)
Carrie Lame, MD [address] [place], [place] [address] Phone:[phone] Fax : [phone]  Primary Care Physician:  Carrie Crews, MD Primary Gastroenterologist:  Dr. Allen Wheeler  Pre-Procedure History & Physical: HPI:  Carrie Wheeler is a 80 y.o. female is here for an endoscopy and colonoscopy.   Past Medical History:  Diagnosis Date   Environmental allergies    GERD (gastroesophageal reflux disease)    occasional   Hyperlipidemia    Hypertension     Past Surgical History:  Procedure Laterality Date   COLONOSCOPY     COLONOSCOPY WITH PROPOFOL N/A 11/27/2015   Procedure: COLONOSCOPY WITH PROPOFOL;  Surgeon: Carrie Lame, MD;  Location: [facility];  Service: Endoscopy;  Laterality: N/A;   TONSILLECTOMY      Prior to Admission medications   Medication Sig Start Date End Date Taking? Authorizing Provider  amLODipine (NORVASC) 10 MG tablet Take 1 tablet (10 mg total) by mouth daily. 03/16/21  Yes Bacigalupo, Dionne Bucy, MD  atorvastatin (LIPITOR) 40 MG tablet Take 1 tablet (40 mg total) by mouth every other day. 10/14/20  Yes Bacigalupo, Dionne Bucy, MD  BIOTIN PO Take 2 tablets by mouth daily. Unsure dose   Yes [provider]  calcium carbonate (OS-CAL) 600 MG TABS tablet Take 1 tablet by mouth daily.   Yes [provider]  cetirizine (ZYRTEC) 10 MG tablet Take 1 tablet by mouth daily. As needed 01/02/10  Yes [provider]  Cholecalciferol (VITAMIN D) 2000 UNITS CAPS Take by mouth daily.    Yes [provider]  fluticasone (FLONASE) 50 MCG/ACT nasal spray SHAKE LIQUID AND USE 2 SPRAYS IN EACH NOSTRIL EVERY DAY 05/07/20  Yes Bacigalupo, Dionne Bucy, MD  Lactobacillus (PROBIOTIC ACIDOPHILUS PO) Take 1 tablet by mouth daily.   Yes [provider]  MAGNESIUM PO Take by mouth.   Yes [provider]  Multiple Vitamins-Minerals (ZINC PO) Take by mouth.   Yes [provider]  Tetrahydrozoline HCl (VISINE  RED EYE COMFORT OP) Place 1 drop into both eyes daily.   Yes [provider]  TURMERIC PO Take by mouth.   Yes [provider]  Na Sulfate-K Sulfate-Mg Sulf (SUPREP BOWEL PREP KIT) 17.5-3.13-1.6 GM/177ML SOLN Take 1 kit by mouth as directed. 04/22/21   Carrie Lame, MD    Allergies as of 04/22/2021   (No Known Allergies)    Family History  Problem Relation Age of Onset   Hypertension Father    Colon cancer Father    Alzheimer's disease Other    Diabetes Other    Colon cancer Other    Liver disease Son    Diabetes Mother    Heart Problems Brother        Congential heart defect   Healthy Brother     Social History   Socioeconomic History   Marital status: Widowed    Spouse name: Not on file   Number of children: 0   Years of education: HS   Highest education level: High school graduate  Occupational History    Employer: [employer]    Comment: part-time  Tobacco Use   Smoking status: Never   Smokeless tobacco: Never  Vaping Use   Vaping Use: Never used  Substance and Sexual Activity   Alcohol use: No    Comment: 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   Social Determinants of Health   Financial Resource Strain: Low Risk    Difficulty of Paying Living Expenses: Not hard at all  Food Insecurity: No Food Insecurity   Worried About Charity fundraiser in the Last Year: Never true   [place] in the Last Year: Never true  Transportation Needs: No Transportation Needs   Lack of Transportation (Medical): No   Lack of Transportation (Non-Medical): No  Physical Activity: Inactive   Days of Exercise per Week: 0 days   Minutes of Exercise per Session: 0 min  Stress: No Stress Concern Present   Feeling of Stress : Not at all  Social Connections: Moderately  Isolated   Frequency of Communication with Friends and Family: More than three times a week   Frequency of Social Gatherings with Friends and Family: More than three times a week   Attends Religious Services: More than 4 times per year   Active Member of [employer] or Organizations: No   Attends Archivist Meetings: Never   Marital Status: Widowed  Human resources officer Violence: Not At Risk   Fear of Current or Ex-Partner: No   Emotionally Abused: No   Physically Abused: No   Sexually Abused: No    Review of Systems: See HPI, otherwise negative ROS  Physical Exam: BP 132/71   Pulse 81   Temp 98.8 F (37.1 C) (Temporal)   Ht 5' (1.524 m)   Wt 87.1 kg   SpO2 96%   BMI 37.50 kg/m  General:   Alert,  pleasant and cooperative in NAD Head:  Normocephalic and atraumatic. Neck:  Supple; no masses or thyromegaly. Lungs:  Clear throughout to auscultation.    Heart:  Regular rate and rhythm. Abdomen:  Soft, nontender and nondistended. Normal bowel sounds, without guarding, and without rebound.   Neurologic:  Alert and  oriented x4;  grossly normal neurologically.  Impression/Plan: Carrie Wheeler is here for an endoscopy and colonoscopy to be performed for dysphagia and history of adenomatous colon polyps 2016  Risks, benefits, limitations, and alternatives regarding  endoscopy and colonoscopy have been reviewed with the patient.  Questions have been answered.  All parties agreeable.   Carrie Lame, MD  05/18/2021, 11:09 AM

## 2021-05-18 NOTE — Op Note (Signed)
[facility] Gastroenterology Patient Name: Carrie Wheeler Procedure Date: 05/18/2021 11:45 AM MRN: [id number] Account #: [id number] Date of Birth: Jul 11, 1941 Admit Type: Outpatient Age: 80 Room: [facility] OR ROOM 01 Gender: Female Note Status: Finalized Procedure:             Upper GI endoscopy Indications:           Dysphagia Providers:             Lucilla Lame MD, MD Referring MD:          Dionne Bucy. Bacigalupo (Referring MD) Medicines:             Propofol per Anesthesia Complications:         No immediate complications. Procedure:             Pre-Anesthesia Assessment:                        - Prior to the procedure, a History and Physical was                         performed, and patient medications and allergies were                         reviewed. The patient's tolerance of previous                         anesthesia was also reviewed. The risks and benefits                         of the procedure and the sedation options and risks                         were discussed with the patient. All questions were                         answered, and informed consent was obtained. Prior                         Anticoagulants: The patient has taken no previous                         anticoagulant or antiplatelet agents. ASA Grade                         Assessment: II - A patient with mild systemic disease.                         After reviewing the risks and benefits, the patient                         was deemed in satisfactory condition to undergo the                         procedure.                        After obtaining informed consent, the endoscope was  passed under direct vision. Throughout the procedure,                         the patient's blood pressure, pulse, and oxygen                         saturations were monitored continuously. The was                         introduced through the mouth, and advanced to the                          second part of duodenum. The upper GI endoscopy was                         accomplished without difficulty. The patient tolerated                         the procedure well. Findings:      A medium-sized hiatal hernia was present.      One benign-appearing, intrinsic moderate stenosis was found at the       gastroesophageal junction. The stenosis was traversed. A TTS dilator was       passed through the scope. Dilation with a 12-13.5-15 mm balloon dilator       was performed to 15 mm. The dilation site was examined following       endoscope reinsertion and showed moderate improvement in luminal       narrowing.      LA Grade D (one or more mucosal breaks involving at least 75% of       esophageal circumference) esophagitis with no bleeding was found in the       lower third of the esophagus. Biopsies were taken with a cold forceps       for histology.      The stomach was normal.      The examined duodenum was normal. Impression:            - Medium-sized hiatal hernia.                        - Benign-appearing esophageal stenosis. Dilated.                        - LA Grade D reflux esophagitis with no bleeding.                         Biopsied.                        - Normal stomach.                        - Normal examined duodenum. Recommendation:        - Discharge patient to home.                        - Resume previous diet.                        - Continue present medications.                        -  Follow an antireflux regimen.                        - Use a proton pump inhibitor PO daily. Procedure Code(s):     --- Professional ---                        [phone], Esophagogastroduodenoscopy, flexible,                         transoral; with transendoscopic balloon dilation of                         esophagus (less than 30 mm diameter)                        [id number], 59, Esophagogastroduodenoscopy, flexible,                         transoral; with biopsy,  single or multiple Diagnosis Code(s):     --- Professional ---                        R13.10, Dysphagia, unspecified                        K22.2, Esophageal obstruction                        K21.00, Gastro-esophageal reflux disease with                         esophagitis, without bleeding CPT copyright 2019 American Medical Association. All rights reserved. The codes documented in this report are preliminary and upon coder review may  be revised to meet current compliance requirements. Lucilla Lame MD, MD 05/18/2021 12:02:09 PM This report has been signed electronically. Number of Addenda: 0 Note Initiated On: 05/18/2021 11:45 AM Estimated Blood Loss:  Estimated blood loss: none.      [facility]

## 2021-05-18 NOTE — Transfer of Care (Signed)
Immediate Anesthesia Transfer of Care Note  Patient: Carrie Wheeler  Procedure(s) Performed: COLONOSCOPY WITH PROPOFOL ESOPHAGOGASTRODUODENOSCOPY (EGD) WITH PROPOFOL  Patient Location: PACU  Anesthesia Type: General  Level of Consciousness: awake, alert  and patient cooperative  Airway and Oxygen Therapy: Patient Spontanous Breathing and Patient connected to supplemental oxygen  Post-op Assessment: Post-op Vital signs reviewed, Patient's Cardiovascular Status Stable, Respiratory Function Stable, Patent Airway and No signs of Nausea or vomiting  Post-op Vital Signs: Reviewed and stable  Complications: No notable events documented.

## 2021-05-19 ENCOUNTER — Encounter: Payer: Self-pay | Admitting: Gastroenterology

## 2021-05-20 LAB — SURGICAL PATHOLOGY

## 2021-05-22 ENCOUNTER — Telehealth: Payer: Self-pay

## 2021-05-22 NOTE — Telephone Encounter (Signed)
Pt scheduled for a follow up appt on July 6th to discuss EGD results.

## 2021-05-22 NOTE — Telephone Encounter (Signed)
-----   Message from Lucilla Lame, MD sent at 05/21/2021  5:16 PM EDT ----- Please have the patient come in for a follow up.

## 2021-05-25 ENCOUNTER — Encounter: Payer: Self-pay | Admitting: Obstetrics and Gynecology

## 2021-05-25 ENCOUNTER — Other Ambulatory Visit (HOSPITAL_COMMUNITY)
Admission: RE | Admit: 2021-05-25 | Discharge: 2021-05-25 | Disposition: A | Discharge status: Home / Self Care | Payer: Medicare Other | Source: Ambulatory Visit | Attending: Obstetrics and Gynecology | Admitting: Obstetrics and Gynecology

## 2021-05-25 ENCOUNTER — Ambulatory Visit: Payer: Medicare Other | Admitting: Obstetrics and Gynecology

## 2021-05-25 ENCOUNTER — Other Ambulatory Visit: Payer: Self-pay

## 2021-05-25 DIAGNOSIS — N814 Uterovaginal prolapse, unspecified: Secondary | ICD-10-CM | POA: Insufficient documentation

## 2021-05-25 DIAGNOSIS — N841 Polyp of cervix uteri: Secondary | ICD-10-CM

## 2021-05-25 NOTE — Progress Notes (Signed)
Patient ID: Carrie Wheeler, female   DOB: 11-09-1941, 80 y.o.   MRN: [id number]  Reason for Consult: Gynecologic Exam   Referred by Virginia Crews, MD  Subjective:     HPI:  Carrie Wheeler is a 80 y.o. female she presents today for consultation regarding possible pelvic prolapse.  She reports that she noticed when taking a shower that she still felt something firm coming from her vagina.  She denies any pain but reports that she is having some pelvic discomfort.  She reports that she has had issues with leakage of urine for more than 5 years.  She reports that she generally goes to the bathroom every 2 hours and is only able to empty a small amount.  She wears a pad throughout the day to collect small amounts of urine that she leaks.  She denies any leakage of urine with cough laugh or sneeze.  She reports that she wakes 2-4 times at night to urinate.  She keeps a bedside commode because of the second she stands up she has urine which overflows.  She has struggled with constipation for several years although she reports currently she has a daily bowel movement.  She is taking a probiotic.  Gynecological History  No LMP recorded. Patient is postmenopausal. Menarche: 13 or 14  Menopause: 60s  She is not currently sexually active.  She reports that her last intercourse was in 1996.  She does not desire to be sexually active in the future.  Past Medical History:  Diagnosis Date   Environmental allergies    GERD (gastroesophageal reflux disease)    occasional   Hyperlipidemia    Hypertension    Family History  Problem Relation Age of Onset   Hypertension Father    Colon cancer Father    Alzheimer's disease Other    Diabetes Other    Colon cancer Other    Liver disease Son    Diabetes Mother    Heart Problems Brother        Congential heart defect   Healthy Brother    Past Surgical History:  Procedure Laterality Date   COLONOSCOPY     COLONOSCOPY WITH PROPOFOL N/A  11/27/2015   Procedure: COLONOSCOPY WITH PROPOFOL;  Surgeon: Lucilla Lame, MD;  Location: [place];  Service: Endoscopy;  Laterality: N/A;   COLONOSCOPY WITH PROPOFOL N/A 05/18/2021   Procedure: COLONOSCOPY WITH PROPOFOL;  Surgeon: Lucilla Lame, MD;  Location: [place];  Service: Endoscopy;  Laterality: N/A;   ESOPHAGOGASTRODUODENOSCOPY (EGD) WITH PROPOFOL N/A 05/18/2021   Procedure: ESOPHAGOGASTRODUODENOSCOPY (EGD) WITH PROPOFOL;  Surgeon: Lucilla Lame, MD;  Location: [place];  Service: Endoscopy;  Laterality: N/A;   POLYPECTOMY N/A 05/18/2021   Procedure: POLYPECTOMY;  Surgeon: Lucilla Lame, MD;  Location: [place];  Service: Endoscopy;  Laterality: N/A;   TONSILLECTOMY      Short Social History:  Social History   Tobacco Use   Smoking status: Never   Smokeless tobacco: Never  Substance Use Topics   Alcohol use: No    Comment: 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 90 tablet 0   atorvastatin (LIPITOR) 40 MG tablet Take 1 tablet (40 mg total) by mouth every other day. 45 tablet 1   BIOTIN PO Take 2  tablets by mouth daily. Unsure dose     calcium carbonate (OS-CAL) 600 MG TABS tablet Take 1 tablet by mouth daily.     cetirizine (ZYRTEC) 10 MG tablet Take 1 tablet by mouth daily. As needed     Cholecalciferol (VITAMIN D) 2000 UNITS CAPS Take by mouth daily.      fluticasone (FLONASE) 50 MCG/ACT nasal spray SHAKE LIQUID AND USE 2 SPRAYS IN EACH NOSTRIL EVERY DAY 16 g 11   Lactobacillus (PROBIOTIC ACIDOPHILUS PO) Take 1 tablet by mouth daily.     MAGNESIUM PO Take by mouth.     Multiple Vitamins-Minerals (ZINC PO) Take by mouth.     Na Sulfate-K Sulfate-Mg Sulf (SUPREP BOWEL PREP KIT) 17.5-3.13-1.6 GM/177ML SOLN Take 1 kit by mouth as directed. 354 mL 0    pantoprazole (PROTONIX) 40 MG tablet Take 1 tablet (40 mg total) by mouth daily. 30 tablet 8   Tetrahydrozoline HCl (VISINE RED EYE COMFORT OP) Place 1 drop into both eyes daily.     TURMERIC PO Take by mouth.     No current facility-administered medications for this visit.    Review of Systems  Constitutional: Negative for chills, fatigue, fever and unexpected weight change.  HENT: Negative for trouble swallowing.  Eyes: Negative for loss of vision.  Respiratory: Negative for cough, shortness of breath and wheezing.  Cardiovascular: Negative for chest pain, leg swelling, palpitations and syncope.  GI: Negative for abdominal pain, blood in stool, diarrhea, nausea and vomiting.  GU: Negative for difficulty urinating, dysuria, frequency and hematuria.  Musculoskeletal: Negative for back pain, leg pain and joint pain.  Skin: Negative for rash.  Neurological: Negative for dizziness, headaches, light-headedness, numbness and seizures.  Psychiatric: Negative for behavioral problem, confusion, depressed mood and sleep disturbance.       Objective:  Objective   There were no vitals filed for this visit. There is no height or weight on file to calculate BMI.  Physical Exam Vitals and nursing note reviewed. Exam conducted with a chaperone present.  Constitutional:      Appearance: Normal appearance. She is well-developed.  HENT:     Head: Normocephalic and atraumatic.  Eyes:     Extraocular Movements: Extraocular movements intact.     Pupils: Pupils are equal, round, and reactive to light.  Cardiovascular:     Rate and Rhythm: Normal rate and regular rhythm.  Pulmonary:     Effort: Pulmonary effort is normal. No respiratory distress.     Breath sounds: Normal breath sounds.  Abdominal:     General: Abdomen is flat.     Palpations: Abdomen is soft.  Musculoskeletal:        General: No signs of injury.  Skin:    General: Skin is warm and dry.  Neurological:     Mental Status: She  is alert and oriented to person, place, and time.  Psychiatric:        Behavior: Behavior normal.        Thought Content: Thought content normal.        Judgment: Judgment normal.   Carrie Wheeler is a 80 y.o. G1P1 with stage 3 uterine prolapse We discussed how her diagnosis of prolapse and she was show illustrations of her prolapse. We discussed that prolapse is generally not life threatening but can cause difficulty with urination and bowel movements. This includes urinary retension and constipation. We discussed that if prolapse is bothersome a finger can be used to gently replace any prolapsing tissue  into the vagina. We discussed treatment option for prolapse including pelvic floor physical therapy, pessary, and pelvic surgery. At this time the patient opts for pessary management of her prolapse. She will plan to follow up for a pessary fitting . She was provided with handouts from [facility] regarding prolapse.   Adrian Prows MD, [facility], [facility] 05/25/2021 4:31 PM

## 2021-05-27 LAB — SURGICAL PATHOLOGY

## 2021-06-02 ENCOUNTER — Ambulatory Visit: Payer: Medicare Other | Admitting: Obstetrics and Gynecology

## 2021-06-02 ENCOUNTER — Encounter: Payer: Self-pay | Admitting: Obstetrics and Gynecology

## 2021-06-02 ENCOUNTER — Other Ambulatory Visit: Payer: Self-pay

## 2021-06-02 VITALS — BP 124/70 | Ht 60.0 in | Wt 198.6 lb

## 2021-06-02 DIAGNOSIS — N39490 Overflow incontinence: Secondary | ICD-10-CM | POA: Diagnosis not present

## 2021-06-02 NOTE — Progress Notes (Signed)
Pessary Fitting Patient presents for a pessary fitting. She desires a pessary as her means of controlling her symptoms of prolapse and/or urinary incontinence. She understands the care needed for a pessary and desires to proceed. Alternative treatment options have been discussed at length and the patient voices an understanding of each option.   PROCEDURE: The patient was placed in dorsal lithotomy position. Examination confirmed prolapse.   A 2 ring pessary was tried: It fell out of the vagina A 3 dish with knob pessary was tried: It was uncomfortable and the patient felt it coming forward while on the toilet A 4 dish with knob pessary was tried: She was happy with this pessary. It stayed in place and she was able to urinate.    A 4 dish with knob pessary was fitted without difficulty. The patient subsequently ambulated, voided and performed valsalva maneuvers without dislodging the pessary and without discomfort.   Will order this pessary and patient will return to have it placed  Referred to urogynecology for incontinence evaluation.  More than 30 minutes were spent face to face with the patient in the room, reviewing the medical record, labs and images, and coordinating care for the patient. The plan of management was discussed in detail and counseling was provided.   Adrian Prows MD, [facility], [facility] 06/02/2021 12:49 PM

## 2021-06-10 ENCOUNTER — Other Ambulatory Visit: Payer: Self-pay

## 2021-06-10 ENCOUNTER — Encounter: Payer: Self-pay | Admitting: Gastroenterology

## 2021-06-10 ENCOUNTER — Ambulatory Visit: Payer: Medicare Other | Admitting: Gastroenterology

## 2021-06-10 VITALS — BP 116/71 | HR 71 | Temp 97.8°F | Ht 60.0 in | Wt 201.6 lb

## 2021-06-10 DIAGNOSIS — D123 Benign neoplasm of transverse colon: Secondary | ICD-10-CM | POA: Diagnosis not present

## 2021-06-10 DIAGNOSIS — K2100 Gastro-esophageal reflux disease with esophagitis, without bleeding: Secondary | ICD-10-CM | POA: Diagnosis not present

## 2021-06-10 NOTE — Progress Notes (Signed)
Primary Care Physician: Virginia Crews, MD  Primary Gastroenterologist:  Dr. Lucilla Lame  Chief Complaint  Patient presents with   Follow up procedure results    HPI: Carrie Wheeler is a 80 y.o. female here follow-up exam and colonoscopy.  The patient had a colonoscopy that showed multiple polyps throughout the colon.  The total count the polyps removed were 18 polyps.  All the polyps were sent for pathology and were all adenomas. The patient's previous colonoscopy in 2016 only showed 4 polyps are adenomatous. The patient's upper endoscopy showed significant esophagitis and biopsies were suggestive of possible Barrett's although endoscopy did not appear to be Barrett's.  Past Medical History:  Diagnosis Date   Environmental allergies    GERD (gastroesophageal reflux disease)    occasional   Hyperlipidemia    Hypertension     Current Outpatient Medications  Medication Sig Dispense Refill   amLODipine (NORVASC) 10 MG tablet Take 1 tablet (10 mg total) by mouth daily. 90 tablet 0   atorvastatin (LIPITOR) 40 MG tablet Take 1 tablet (40 mg total) by mouth every other day. 45 tablet 1   BIOTIN PO Take 2 tablets by mouth daily. Unsure dose     calcium carbonate (OS-CAL) 600 MG TABS tablet Take 1 tablet by mouth daily.     cetirizine (ZYRTEC) 10 MG tablet Take 1 tablet by mouth daily. As needed     Cholecalciferol (VITAMIN D) 2000 UNITS CAPS Take by mouth daily.      fluticasone (FLONASE) 50 MCG/ACT nasal spray SHAKE LIQUID AND USE 2 SPRAYS IN EACH NOSTRIL EVERY DAY 16 g 11   Lactobacillus (PROBIOTIC ACIDOPHILUS PO) Take 1 tablet by mouth daily.     MAGNESIUM PO Take by mouth.     Multiple Vitamins-Minerals (ZINC PO) Take by mouth.     Na Sulfate-K Sulfate-Mg Sulf (SUPREP BOWEL PREP KIT) 17.5-3.13-1.6 GM/177ML SOLN Take 1 kit by mouth as directed. 354 mL 0   pantoprazole (PROTONIX) 40 MG tablet Take 1 tablet (40 mg total) by mouth daily. 30 tablet 8   Tetrahydrozoline HCl  (VISINE RED EYE COMFORT OP) Place 1 drop into both eyes daily.     TURMERIC PO Take by mouth.     No current facility-administered medications for this visit.    Allergies as of 06/10/2021   (No Known Allergies)    ROS:  General: Negative for anorexia, weight loss, fever, chills, fatigue, weakness. ENT: Negative for hoarseness, difficulty swallowing , nasal congestion. CV: Negative for chest pain, angina, palpitations, dyspnea on exertion, peripheral edema.  Respiratory: Negative for dyspnea at rest, dyspnea on exertion, cough, sputum, wheezing.  GI: See history of present illness. GU:  Negative for dysuria, hematuria, urinary incontinence, urinary frequency, nocturnal urination.  Endo: Negative for unusual weight change.    Physical Examination:   BP 116/71 (BP Location: Left Arm, Patient Position: Sitting, Cuff Size: Large)   Pulse 71   Temp 97.8 F (36.6 C) (Temporal)   Ht 5' (1.524 m)   Wt 201 lb 9.6 oz (91.4 kg)   BMI 39.37 kg/m   General: Well-nourished, well-developed in no acute distress.  Eyes: No icterus. Conjunctivae pink. Neuro: Alert and oriented x 3.  Grossly intact. Skin: Warm and dry, no jaundice.   Psych: Alert and cooperative, normal mood and affect.  Labs:    Imaging Studies: No results found.  Assessment and Plan:   Carrie Wheeler is a 80 y.o. y/o female here for follow-up  after having a colonoscopy and EGD.  The patient's heartburn has resolved with her PPI.  The patient's polyps were all adenomatous and she had 18 of them.  The patient is in relatively good health and has been told that I would recommend a repeat colonoscopy in 1 year.  The patient will be set up for repeat colonoscopy in 1 year.  The patient has been in the plan and agrees with it.     Lucilla Lame, MD. [facility]    Note: This dictation was prepared with Dragon dictation along with smaller phrase technology. Any transcriptional errors that result from this process are  unintentional.

## 2021-06-12 ENCOUNTER — Telehealth: Payer: Self-pay

## 2021-06-12 NOTE — Telephone Encounter (Signed)
Attempt made to contact Carrie Wheeler is a 80 y.o. female re: new pt appt Pt was not available.  LM on the VM for the patient to call me back.

## 2021-06-22 ENCOUNTER — Telehealth: Payer: Self-pay | Admitting: Family Medicine

## 2021-06-22 MED ORDER — ATORVASTATIN CALCIUM 40 MG PO TABS: 40.0000 mg | ORAL_TABLET | ORAL | 1 refills | Status: DC

## 2021-06-22 NOTE — Telephone Encounter (Signed)
[facility] faxed refill request for the following medications:  atorvastatin (LIPITOR) 40 MG tablet  Last Rx: 10/14/20 Qty: 45 Refills: 1 LOV: 04/13/21 Please advise. Thanks TNP

## 2021-07-22 ENCOUNTER — Encounter: Payer: Self-pay | Admitting: Obstetrics and Gynecology

## 2021-07-22 ENCOUNTER — Other Ambulatory Visit: Payer: Self-pay

## 2021-07-22 ENCOUNTER — Ambulatory Visit: Payer: Medicare Other | Admitting: Obstetrics and Gynecology

## 2021-07-22 VITALS — BP 120/68 | Ht 60.0 in | Wt 203.6 lb

## 2021-07-22 DIAGNOSIS — N819 Female genital prolapse, unspecified: Secondary | ICD-10-CM

## 2021-07-22 DIAGNOSIS — N39490 Overflow incontinence: Secondary | ICD-10-CM | POA: Diagnosis not present

## 2021-07-22 DIAGNOSIS — N814 Uterovaginal prolapse, unspecified: Secondary | ICD-10-CM | POA: Diagnosis not present

## 2021-07-22 NOTE — Progress Notes (Signed)
Pessary Fitting Patient presents for a pessary fitting. She desires a pessary as her means of controlling her symptoms of prolapse and/or urinary incontinence. She understands the care needed for a pessary and desires to proceed. Alternative treatment options have been discussed at length and the patient voices an understanding of each option.   PROCEDURE: The patient was placed in dorsal lithotomy position. Examination confirmed prolapse. A 4 dish with knob pessary was fitted without difficulty. The patient subsequently ambulated, voided and performed valsalva maneuvers without dislodging the pessary and without discomfort. Care instructions were provided. Patient was discharged to home in stable condition.   More than 15 minutes were spent face to face with the patient in the room, reviewing the medical record, labs and images, and coordinating care for the patient. The plan of management was discussed in detail and counseling was provided.   Adrian Prows MD, [facility], [facility] 07/22/2021 10:43 AM

## 2021-07-22 NOTE — Patient Instructions (Signed)
How to Use a Vaginal Pessary  A vaginal pessary is a removable device that is placed into your vagina to support pelvic organs that droop. These organs include your uterus, bladder, and rectum. When your pelvic organs drop down into your vagina, it causes acondition called pelvic organ prolapse (POP). A pessary may be an alternative to surgery for women with POP. It may help women who leak urine when they strain or exercise (stress incontinence). This is a symptom of POP. A vaginal pessary may also be a temporarytreatment for stress incontinence during pregnancy. There are several types of pessaries. All types are usually made of silicone. You can insert and remove some on your own. Other types must be inserted and removed by your health care provider at office visits. The reason you are using a pessary and the severity of your condition will determine which one is bestfor you. It is also important to find the right size. A pessary that is too small may fall out. A pessary that is too large may cause pain or discomfort. Your health care provider will do a physical exam to find the correct size and fit for yourpessary. It may take several appointments to find the best fit for you. If you can be fit with the type of pessary that you can insert, remove, and clean yourself, your health care provider will teach you how to use your pessary at home. You may have checkups every few months. If you have the type of pessary that needs to be inserted and removed by your health care provider, you will have appointments every few months to have the pessary removed,cleaned, and replaced. What are the risks? When properly fitted and cared for, risks of using a vaginal pessary can be small. However, there can be problems that may include: Vaginal discharge. Vaginal bleeding. A bad smell coming from your vagina. Scraping of the skin inside your vagina. How to use your pessary Follow your health care provider's  instructions for using a pessary. These instructions may vary, depending on the type of pessary you have. To insert a pessary: Wash your hands with soap and water for at least 20 seconds. Squeeze or fold the pessary in half and lubricate the tip with a water-based lubricant. Insert the pessary into your vagina. It will unfold and provide support. To remove the pessary, gently tug it out of your vagina. You can remove the pessary every night or after several days. You can also remove it to have sex. How to care for your pessary If you have a pessary that you can remove: Clean your pessary with soap and water. Rinse well. Dry it completely before inserting it back into your vagina. Follow these instructions at home: Take over-the-counter and prescription medicines only as told by your health care provider. Your health care provider may prescribe an estrogen cream to moisten your vagina. Keep all follow-up visits. This is important. Contact a health care provider if: You feel any pain or discomfort when your pessary is in place. You continue to have stress incontinence. You have trouble keeping your pessary from falling out. You have an unusual vaginal discharge that is blood-tinged or smells bad. Summary A vaginal pessary is a removable device that is placed into your vagina to support pelvic organs that droop. This condition is called pelvic organ prolapse (POP). There are several types of pessaries. Some you can insert and remove on your own. Others must be inserted and removed by your health care  provider. The best type for you depends on the reason you are using a pessary and the severity of your condition. It is also important to find the right size. If you can use the type that you insert and remove on your own, your health care provider will teach you how to use it and schedule checkups every few months. If you have the type that needs to be inserted and removed by your health care  provider, you will have regular appointments to have your pessary removed, cleaned, and replaced. This information is not intended to replace advice given to you by your health care provider. Make sure you discuss any questions you have with your healthcare provider. Document Revised: 05/22/2020 Document Reviewed: 05/22/2020 Elsevier Patient Education  [place].

## 2021-07-27 ENCOUNTER — Other Ambulatory Visit: Payer: Self-pay

## 2021-07-27 ENCOUNTER — Telehealth: Payer: Self-pay | Admitting: Family Medicine

## 2021-07-27 DIAGNOSIS — J302 Other seasonal allergic rhinitis: Secondary | ICD-10-CM

## 2021-07-27 MED ORDER — FLUTICASONE PROPIONATE 50 MCG/ACT NA SUSP: NASAL | 11 refills | Status: DC

## 2021-07-27 NOTE — Telephone Encounter (Signed)
Converted message to refill request

## 2021-07-27 NOTE — Telephone Encounter (Signed)
[facility] faxed refill request for the following medications:   fluticasone (FLONASE) 50 MCG/ACT nasal spray   Please advise.  

## 2021-09-08 ENCOUNTER — Ambulatory Visit: Payer: Medicare Other | Admitting: Obstetrics and Gynecology

## 2021-09-14 ENCOUNTER — Telehealth: Payer: Self-pay | Admitting: Family Medicine

## 2021-09-14 DIAGNOSIS — I10 Essential (primary) hypertension: Secondary | ICD-10-CM

## 2021-09-14 NOTE — Telephone Encounter (Signed)
[facility] faxed refill request for the following medications: ° °amLODipine (NORVASC) 10 MG tablet  ° ° °Please advise. °

## 2021-09-15 MED ORDER — AMLODIPINE BESYLATE 10 MG PO TABS: 10.0000 mg | ORAL_TABLET | Freq: Every day | ORAL | 0 refills | Status: DC

## 2021-10-05 ENCOUNTER — Ambulatory Visit
Admission: RE | Admit: 2021-10-05 | Discharge: 2021-10-05 | Disposition: A | Discharge status: Home / Self Care | Payer: Medicare Other | Source: Ambulatory Visit | Attending: Family Medicine | Admitting: Family Medicine

## 2021-10-05 ENCOUNTER — Other Ambulatory Visit: Payer: Self-pay

## 2021-10-05 DIAGNOSIS — Z1231 Encounter for screening mammogram for malignant neoplasm of breast: Secondary | ICD-10-CM | POA: Insufficient documentation

## 2021-10-05 DIAGNOSIS — Z0000 Encounter for general adult medical examination without abnormal findings: Secondary | ICD-10-CM
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
## 2021-10-14 ENCOUNTER — Encounter: Payer: Self-pay | Admitting: Obstetrics and Gynecology

## 2021-10-14 ENCOUNTER — Ambulatory Visit: Payer: Medicare Other | Admitting: Obstetrics and Gynecology

## 2021-10-14 ENCOUNTER — Telehealth: Payer: Self-pay | Admitting: Obstetrics and Gynecology

## 2021-10-14 ENCOUNTER — Other Ambulatory Visit: Payer: Self-pay

## 2021-10-14 VITALS — BP 144/73 | HR 73 | Ht <= 58 in | Wt 209.0 lb

## 2021-10-14 DIAGNOSIS — N812 Incomplete uterovaginal prolapse: Secondary | ICD-10-CM

## 2021-10-14 DIAGNOSIS — R319 Hematuria, unspecified: Secondary | ICD-10-CM

## 2021-10-14 DIAGNOSIS — R159 Full incontinence of feces: Secondary | ICD-10-CM | POA: Diagnosis not present

## 2021-10-14 DIAGNOSIS — N3281 Overactive bladder: Secondary | ICD-10-CM | POA: Diagnosis not present

## 2021-10-14 DIAGNOSIS — N3000 Acute cystitis without hematuria: Secondary | ICD-10-CM | POA: Diagnosis not present

## 2021-10-14 DIAGNOSIS — R350 Frequency of micturition: Secondary | ICD-10-CM

## 2021-10-14 DIAGNOSIS — N8110 Cystocele, unspecified: Secondary | ICD-10-CM

## 2021-10-14 DIAGNOSIS — N393 Stress incontinence (female) (male): Secondary | ICD-10-CM

## 2021-10-14 DIAGNOSIS — N816 Rectocele: Secondary | ICD-10-CM

## 2021-10-14 LAB — POCT URINALYSIS DIPSTICK
Appearance: NORMAL
Bilirubin, UA: NEGATIVE
Glucose, UA: NEGATIVE
Ketones, UA: NEGATIVE
Nitrite, UA: NEGATIVE
Protein, UA: NEGATIVE
Spec Grav, UA: 1.025 (ref 1.010–1.025)
Urobilinogen, UA: 0.2 E.U./dL
pH, UA: 5.5 (ref 5.0–8.0)

## 2021-10-14 MED ORDER — MIRABEGRON ER 25 MG PO TB24: 25.0000 mg | ORAL_TABLET | Freq: Every day | ORAL | 5 refills | Status: DC

## 2021-10-14 NOTE — Telephone Encounter (Signed)
No she can reschedule for 3 months from last cleaning

## 2021-10-14 NOTE — Patient Instructions (Signed)

## 2021-10-14 NOTE — Telephone Encounter (Signed)
This patient is scheduled for a pessary cleaning on 11/14.  She saw a urology dr today and she cleaned the pesssary.  She is wondering if she needs to keep this appt with you.

## 2021-10-15 NOTE — Telephone Encounter (Signed)
I will let the patient know.

## 2021-10-19 ENCOUNTER — Ambulatory Visit: Payer: Medicare Other | Admitting: Obstetrics and Gynecology

## 2021-10-20 ENCOUNTER — Encounter: Payer: Self-pay | Admitting: Family Medicine

## 2021-10-20 ENCOUNTER — Other Ambulatory Visit: Payer: Self-pay

## 2021-10-20 ENCOUNTER — Ambulatory Visit (INDEPENDENT_AMBULATORY_CARE_PROVIDER_SITE_OTHER): Payer: Medicare Other | Admitting: Family Medicine

## 2021-10-20 VITALS — BP 128/60 | HR 75 | Temp 97.8°F | Resp 16 | Ht 60.0 in | Wt 207.5 lb

## 2021-10-20 DIAGNOSIS — Z0000 Encounter for general adult medical examination without abnormal findings: Secondary | ICD-10-CM | POA: Diagnosis not present

## 2021-10-20 DIAGNOSIS — E7800 Pure hypercholesterolemia, unspecified: Secondary | ICD-10-CM | POA: Diagnosis not present

## 2021-10-20 DIAGNOSIS — I10 Essential (primary) hypertension: Secondary | ICD-10-CM

## 2021-10-20 DIAGNOSIS — Z23 Encounter for immunization: Secondary | ICD-10-CM | POA: Diagnosis not present

## 2021-10-20 DIAGNOSIS — R7303 Prediabetes: Secondary | ICD-10-CM

## 2021-10-20 LAB — URINALYSIS, MICROSCOPIC ONLY
Casts: NONE SEEN /lpf
WBC, UA: >30 /hpf — AB (ref 0–5)

## 2021-10-20 LAB — URINE CULTURE

## 2021-10-20 NOTE — Assessment & Plan Note (Signed)
Well controlled BP 128/60 in office Recheck CMP F/u in 6 months

## 2021-10-20 NOTE — Patient Instructions (Signed)
The CDC recommends two doses of Shingrix (the shingles vaccine) separated by 2 to 6 months for adults age 78 years and older. I recommend checking with your insurance plan regarding coverage for this vaccine.   Also ask about the new COVID bivalent booster

## 2021-10-20 NOTE — Progress Notes (Signed)
Annual Wellness Visit     Patient: Carrie Wheeler, Female    DOB: [birth year], 80 y.o.   MRN: [id number] Visit Date: 10/20/2021  Today's Provider: [name], MD   Chief Complaint  Patient presents with   Medicare Wellness   Subjective    Carrie Wheeler is a 80 y.o. female who presents today for her Annual Wellness Visit. She reports consuming a general diet. The patient does not participate in regular exercise at present. She generally feels fairly well. She reports sleeping fairly well. She does not have additional problems to discuss today.   Followed by urogynecology for incontinence Tolerating medications well Activity limited by arthritic pain in back, knees, ankles. Treats pain with ibuprofen  Medications: Outpatient Medications Prior to Visit  Medication Sig   amLODipine (NORVASC) 10 MG tablet Take 1 tablet (10 mg total) by mouth daily.   atorvastatin (LIPITOR) 40 MG tablet Take 1 tablet (40 mg total) by mouth every other day.   BIOTIN PO Take 2 tablets by mouth daily. Unsure dose   calcium carbonate (OS-CAL) 600 MG TABS tablet Take 1 tablet by mouth daily.   cetirizine (ZYRTEC) 10 MG tablet Take 1 tablet by mouth daily. As needed   Cholecalciferol (VITAMIN D) 2000 UNITS CAPS Take by mouth daily.    fluticasone (FLONASE) 50 MCG/ACT nasal spray SHAKE LIQUID AND USE 2 SPRAYS IN EACH NOSTRIL EVERY DAY   Lactobacillus (PROBIOTIC ACIDOPHILUS PO) Take 1 tablet by mouth daily.   MAGNESIUM PO Take by mouth.   mirabegron ER (MYRBETRIQ) 25 MG TB24 tablet Take 1 tablet (25 mg total) by mouth daily.   Multiple Vitamins-Minerals (ZINC PO) Take by mouth.   pantoprazole (PROTONIX) 40 MG tablet Take 1 tablet (40 mg total) by mouth daily.   Tetrahydrozoline HCl (VISINE RED EYE COMFORT OP) Place 1 drop into both eyes daily.   TURMERIC PO Take by mouth.   No facility-administered medications prior to visit.    No Known Allergies  Patient Care Team: Virginia Crews, MD as PCP - General (Family Medicine) Judee Clara, DO as Referring Physician (Chiropractic Medicine) Estill Cotta, MD (Ophthalmology)  Review of Systems  Constitutional: Negative.   HENT: Negative.    Eyes: Negative.   Respiratory: Negative.    Cardiovascular: Negative.   Gastrointestinal: Negative.   Genitourinary:  Positive for urgency.  Musculoskeletal:  Positive for arthralgias and back pain.  Skin: Negative.   Neurological: Negative.   All other systems reviewed and are negative.      Objective    Vitals: BP 128/60 (BP Location: Left Arm, Patient Position: Sitting, Cuff Size: Large)   Pulse 75   Temp 97.8 F (36.6 C) (Oral)   Resp 16   Ht 5' (1.524 m)   Wt 207 lb 8 oz (94.1 kg)   SpO2 98%   BMI 40.52 kg/m    Physical Exam Vitals reviewed.  Constitutional:      General: She is not in acute distress.    Appearance: Normal appearance. She is not ill-appearing or toxic-appearing.  HENT:     Head: Normocephalic and atraumatic.     Right Ear: External ear normal.     Left Ear: External ear normal.     Nose: Nose normal.     Mouth/Throat:     Mouth: Mucous membranes are moist.     Pharynx: Oropharynx is clear. No oropharyngeal exudate or posterior oropharyngeal erythema.  Eyes:     General: No  scleral icterus.    Extraocular Movements: Extraocular movements intact.     Conjunctiva/sclera: Conjunctivae normal.     Pupils: Pupils are equal, round, and reactive to light.  Cardiovascular:     Rate and Rhythm: Normal rate and regular rhythm.     Pulses: Normal pulses.     Heart sounds: Normal heart sounds. No murmur heard.   No friction rub. No gallop.  Pulmonary:     Effort: Pulmonary effort is normal. No respiratory distress.     Breath sounds: Normal breath sounds. No wheezing or rhonchi.  Chest:     Chest wall: No tenderness.  Abdominal:     General: Abdomen is flat. There is no distension.     Palpations: Abdomen is soft.     Tenderness:  There is no abdominal tenderness.  Musculoskeletal:        General: Normal range of motion.     Cervical back: Normal range of motion and neck supple.     Right lower leg: No edema.     Left lower leg: No edema.  Skin:    General: Skin is warm and dry.     Capillary Refill: Capillary refill takes less than 2 seconds.     Findings: No lesion or rash.  Neurological:     General: No focal deficit present.     Mental Status: She is alert and oriented to person, place, and time. Mental status is at baseline.  Psychiatric:        Mood and Affect: Mood normal.    Most recent functional status assessment: In your present state of health, do you have any difficulty performing the following activities: 10/20/2021  Hearing? N  Vision? N  Difficulty concentrating or making decisions? N  Walking or climbing stairs? Y  Dressing or bathing? N  Doing errands, shopping? N  Some recent data might be hidden   Most recent fall risk assessment: Fall Risk  10/20/2021  Falls in the past year? 1  Number falls in past yr: 0  Injury with Fall? 0  Risk for fall due to : Impaired balance/gait  Follow up -    Most recent depression screenings: PHQ 2/9 Scores 10/20/2021 04/13/2021  PHQ - 2 Score 0 0  PHQ- 9 Score - 0   Most recent cognitive screening: 6CIT Screen 10/20/2021  What Year? 0 points  What month? 0 points  What time? 0 points  Count back from 20 0 points  Months in reverse 0 points  Repeat phrase 2 points  Total Score 2   Most recent Audit-C alcohol use screening Alcohol Use Disorder Test (AUDIT) 10/20/2021  1. How often do you have a drink containing alcohol? 0  2. How many drinks containing alcohol do you have on a typical day when you are drinking? 0  3. How often do you have six or more drinks on one occasion? 0  AUDIT-C Score 0  Alcohol Brief Interventions/Follow-up -   A score of 3 or more in women, and 4 or more in men indicates increased risk for alcohol abuse, EXCEPT if  all of the points are from question 1   No results found for any visits on 10/20/21.  Assessment & Plan     Annual wellness visit done today including the all of the following: Reviewed patient's Family Medical History Reviewed and updated list of patient's medical providers Assessment of cognitive impairment was done Assessed patient's functional ability Established a written schedule for health screening services  Health Risk Assessent Completed and Reviewed  Exercise Activities and Dietary recommendations  Goals      Weight (lb) < 175 lb (79.4 kg)     Recommend to continue current diet plans of eating healthier options and avoiding heavy carbohydrates, sodium and sugar.        Immunization History  Administered Date(s) Administered   Influenza, High Dose Seasonal PF 09/12/2017, 09/18/2018, 10/03/2019, 09/16/2020   PFIZER(Purple Top)SARS-COV-2 Vaccination 01/30/2020, 02/20/2020, 10/14/2020   Pneumococcal Conjugate-13 05/19/2015   Pneumococcal Polysaccharide-23 04/02/2011   Td 06/01/2006, 08/30/2016   Tdap 06/01/2006    Health Maintenance  Topic Date Due   Zoster Vaccines- Shingrix (1 of 2) Never done   COVID-19 Vaccine (4 - Booster for Pfizer series) 12/09/2020   INFLUENZA VACCINE  07/06/2021   MAMMOGRAM  10/06/2023   COLONOSCOPY (Pts 45-10yrs Insurance coverage will need to be confirmed)  05/18/2026   TETANUS/TDAP  08/30/2026   Pneumonia Vaccine 69+ Years old  Completed   DEXA SCAN  Completed   HPV VACCINES  Aged Out     Discussed health benefits of physical activity, and encouraged her to engage in regular exercise appropriate for her age and condition.   Problem List Items Addressed This Visit       Cardiovascular and Mediastinum   Essential (primary) hypertension    Well controlled BP 128/60 in office Recheck CMP F/u in 6 months        Other   Hyperlipidemia    Previously well controlled Continue atorvastatin 40mg  Recheck FLP, CMP       Relevant Orders   Lipid panel   Comprehensive metabolic panel   Morbid obesity ([place])    BMI 40, associated with hypertension and hyperlipidemia Discussed importance of healthy weight management Discussed diet and exercise      Prediabetes    Previously well controlled Recheck HgbA1C      Relevant Orders   Hemoglobin A1c   Other Visit Diagnoses     Encounter for Medicare annual wellness exam    -  Primary   Encounter for annual physical exam       Relevant Orders   Hemoglobin A1c   Lipid panel   Comprehensive metabolic panel   Needs flu shot       Relevant Orders   Flu Vaccine QUAD High Dose(Fluad)        Return in about 6 months (around 04/19/2022) for chronic disease f/u.     Nelva Nay, Medical Student 10/20/2021, 9:27 AM  Patient seen along with MS3 student Nelva Nay. I personally evaluated this patient along with the student, and verified all aspects of the history, physical exam, and medical decision making as documented by the student. I agree with the student's documentation and have made all necessary edits.  Ardith Lewman, Dionne Bucy, MD, MPH [facility]

## 2021-10-20 NOTE — Assessment & Plan Note (Signed)
Previously well controlled Recheck HgbA1C

## 2021-10-20 NOTE — Assessment & Plan Note (Signed)
BMI 40, associated with hypertension and hyperlipidemia Discussed importance of healthy weight management Discussed diet and exercise

## 2021-10-20 NOTE — Assessment & Plan Note (Signed)
Previously well controlled Continue atorvastatin 40mg  Recheck FLP, CMP

## 2021-10-21 LAB — LIPID PANEL
Chol/HDL Ratio: 2.9 ratio (ref 0.0–4.4)
Cholesterol, Total: 168 mg/dL (ref 100–199)
HDL: 58 mg/dL (ref >39)
LDL Chol Calc (NIH): 96 mg/dL (ref 0–99)
Triglycerides: 76 mg/dL (ref 0–149)
VLDL Cholesterol Cal: 14 mg/dL (ref 5–40)

## 2021-10-21 LAB — HEMOGLOBIN A1C
Est. average glucose Bld gHb Est-mCnc: 117 mg/dL
Hgb A1c MFr Bld: 5.7 % — ABNORMAL HIGH (ref 4.8–5.6)

## 2021-10-21 LAB — COMPREHENSIVE METABOLIC PANEL
ALT: 14 IU/L (ref 0–32)
AST: 17 IU/L (ref 0–40)
Albumin/Globulin Ratio: 1.8 (ref 1.2–2.2)
Albumin: 4.4 g/dL (ref 3.7–4.7)
Alkaline Phosphatase: 150 IU/L — ABNORMAL HIGH (ref 44–121)
BUN/Creatinine Ratio: 22 (ref 12–28)
BUN: 22 mg/dL (ref 8–27)
Bilirubin Total: 0.6 mg/dL (ref 0.0–1.2)
CO2: 26 mmol/L (ref 20–29)
Calcium: 9.6 mg/dL (ref 8.7–10.3)
Chloride: 106 mmol/L (ref 96–106)
Creatinine, Ser: 0.99 mg/dL (ref 0.57–1.00)
Globulin, Total: 2.5 g/dL (ref 1.5–4.5)
Glucose: 88 mg/dL (ref 70–99)
Potassium: 5 mmol/L (ref 3.5–5.2)
Sodium: 146 mmol/L — ABNORMAL HIGH (ref 134–144)
Total Protein: 6.9 g/dL (ref 6.0–8.5)
eGFR: 59 mL/min/{1.73_m2} — ABNORMAL LOW (ref >59)

## 2021-10-21 MED ORDER — AMPICILLIN 500 MG PO CAPS
500.0000 mg | ORAL_CAPSULE | Freq: Four times a day (QID) | ORAL | 0 refills | Status: AC
Start: 2021-10-21 — End: 2021-10-28

## 2021-10-21 NOTE — Addendum Note (Signed)
Addended by: Jaquita Folds on: 10/21/2021 10:52 AM   Modules accepted: Orders

## 2021-10-22 NOTE — Progress Notes (Signed)
Carrie Wheeler is a 80 y.o. female was contacted.  Pt verified using 2 identifiers. Confirmation that I am speaking with the correct person. Pt was notified of the results listed. Pt verbalized understanding and has picked up the medication

## 2021-10-22 NOTE — Progress Notes (Signed)
Pt was notified.  

## 2021-12-09 ENCOUNTER — Other Ambulatory Visit: Payer: Self-pay

## 2021-12-09 ENCOUNTER — Ambulatory Visit: Payer: Medicare Other | Admitting: Obstetrics and Gynecology

## 2021-12-09 ENCOUNTER — Encounter: Payer: Self-pay | Admitting: Obstetrics and Gynecology

## 2021-12-09 VITALS — BP 117/75 | HR 80

## 2021-12-09 DIAGNOSIS — N3281 Overactive bladder: Secondary | ICD-10-CM

## 2021-12-09 DIAGNOSIS — R350 Frequency of micturition: Secondary | ICD-10-CM

## 2021-12-09 NOTE — Progress Notes (Signed)
[facility] Return Visit  SUBJECTIVE  History of Present Illness: Carrie Wheeler is a 81 y.o. female seen in follow-up for overactive bladder. Plan at last visit was to start Myrbetriq 25mg  and pelvic PT. She is scheduled for PT next week.   Has not noticed much of a difference with the Myrbetriq. Not much difference with frequency during the day. Is getting up one time less at night. Urine is no longer "pouring out" at night. Has noticed some more dry mouth. She is trying to drink more water, about 20- 30 oz a day. Drinking less of the coffee, tea and soda.  Past Medical History: Patient  has a past medical history of Environmental allergies, GERD (gastroesophageal reflux disease), Hyperlipidemia, and Hypertension.   Past Surgical History: She  has a past surgical history that includes Tonsillectomy; Colonoscopy; Colonoscopy with propofol (N/A, 11/27/2015); Colonoscopy with propofol (N/A, 05/18/2021); Esophagogastroduodenoscopy (egd) with propofol (N/A, 05/18/2021); and polypectomy (N/A, 05/18/2021).   Medications: She has a current medication list which includes the following prescription(s): amlodipine, atorvastatin, biotin, calcium carbonate, cetirizine, vitamin d, fluticasone, lactobacillus, magnesium, mirabegron er, multiple vitamins-minerals, pantoprazole, tetrahydrozoline hcl, and turmeric.   Allergies: Patient has No Known Allergies.   Social History: Patient  reports that she has never smoked. She has never used smokeless tobacco. She reports that she does not drink alcohol and does not use drugs.      OBJECTIVE     Physical Exam: Vitals:   12/09/21 0825  BP: 117/75  Pulse: 80   Gen: No apparent distress, A&O x 3.  Detailed Urogynecologic Evaluation:  Deferred. Prior exam showed:  POP-Q (10/14/21):    POP-Q   -2                                            Aa   -2                                           Ba   -6                                               C    2.5                                            Gh   3.5                                            Pb   9                                            tvl    0  Ap   0                                            Bp   -7                                              D      ASSESSMENT AND PLAN    Carrie Wheeler is a 81 y.o. with:  1. Urinary frequency   2. Overactive bladder    - Mild improvement with myrbetriq. We discussed increasing the dose to 50mg  but she would prefer to pause the medication and work with physical therapy.  - We will plan to meet again after physical therapy is complete to reassess symptoms.  - She will also continue to decrease bladder irritants and drink more water.   Return 6 months or sooner if needed  Jaquita Folds, MD  Time spent: I spent 20 minutes dedicated to the care of this patient on the date of this encounter to include pre-visit review of records, face-to-face time with the patient and post visit documentation.

## 2021-12-10 ENCOUNTER — Ambulatory Visit: Payer: Medicare Other | Attending: Obstetrics and Gynecology | Admitting: Physical Therapy

## 2021-12-10 ENCOUNTER — Encounter: Payer: Self-pay | Admitting: Physical Therapy

## 2021-12-10 DIAGNOSIS — R278 Other lack of coordination: Secondary | ICD-10-CM | POA: Diagnosis not present

## 2021-12-10 DIAGNOSIS — R2689 Other abnormalities of gait and mobility: Secondary | ICD-10-CM | POA: Diagnosis not present

## 2021-12-10 DIAGNOSIS — R296 Repeated falls: Secondary | ICD-10-CM | POA: Insufficient documentation

## 2021-12-10 DIAGNOSIS — M4126 Other idiopathic scoliosis, lumbar region: Secondary | ICD-10-CM | POA: Diagnosis not present

## 2021-12-10 NOTE — Patient Instructions (Signed)
°  Proper body mechanics with getting out of a chair to decrease strain  on back &pelvic floor   Avoid holding your breath when Getting out of the chair:  Scoot to front part of chair chair Heels behind knees, feet are hip width apart, nose over toes  Inhale like you are smelling roses Exhale to stand    __   To address low back pain  - seated side stretch - R arm overhead  10 reps x 3 x day  at work  Feet on floor  - at home, L ysidelying, pillow between knees and behind back   Drag top palm over bottom arm, chest, turn 3/4 turn , relax on pillow and drag back to palm again 15 reps in morning and night
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
## 2021-12-13 ENCOUNTER — Other Ambulatory Visit: Payer: Self-pay | Admitting: Family Medicine

## 2021-12-13 DIAGNOSIS — I10 Essential (primary) hypertension: Secondary | ICD-10-CM

## 2021-12-13 NOTE — Telephone Encounter (Signed)
Requested Prescriptions  Pending Prescriptions Disp Refills   amLODipine (NORVASC) 10 MG tablet [Pharmacy Med Name: AMLODIPINE BESYLATE 10MG  TABLETS] 90 tablet 0    Sig: TAKE 1 TABLET(10 MG) BY MOUTH DAILY     Cardiovascular:  Calcium Channel Blockers Passed - 12/13/2021  6:24 AM      Passed - Last BP in normal range    BP Readings from Last 1 Encounters:  12/09/21 117/75         Passed - Valid encounter within last 6 months    Recent Outpatient Visits          1 month ago Encounter for [employer] annual wellness exam   [facility] [place], Dionne Bucy, MD   8 months ago Essential (primary) hypertension   [facility], Dionne Bucy, MD   1 year ago Encounter for annual physical exam   [facility] [place], Dionne Bucy, MD   1 year ago Essential (primary) hypertension   [facility] [place], Dionne Bucy, MD   2 years ago Encounter for annual physical exam   [facility] [place], Dionne Bucy, MD      Future Appointments            In 4 months [place], Dionne Bucy, MD [facility]   In 6 months Wannetta Sender, Governor Rooks, MD Urogynecology at [facility], [facility]

## 2021-12-14 ENCOUNTER — Telehealth: Payer: Self-pay

## 2021-12-14 MED ORDER — ATORVASTATIN CALCIUM 40 MG PO TABS: 40.0000 mg | ORAL_TABLET | ORAL | 1 refills | Status: DC

## 2021-12-14 NOTE — Telephone Encounter (Signed)
[facility] faxed refill request for the following medications:  atorvastatin (LIPITOR) 40 MG tablet   Please advise.  

## 2021-12-16 ENCOUNTER — Other Ambulatory Visit: Payer: Self-pay

## 2021-12-16 ENCOUNTER — Ambulatory Visit: Payer: Medicare Other | Admitting: Physical Therapy

## 2021-12-16 DIAGNOSIS — R296 Repeated falls: Secondary | ICD-10-CM | POA: Diagnosis not present

## 2021-12-16 DIAGNOSIS — M4126 Other idiopathic scoliosis, lumbar region: Secondary | ICD-10-CM

## 2021-12-16 DIAGNOSIS — R278 Other lack of coordination: Secondary | ICD-10-CM | POA: Diagnosis not present

## 2021-12-16 DIAGNOSIS — R2689 Other abnormalities of gait and mobility: Secondary | ICD-10-CM
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
## 2021-12-16 NOTE — Patient Instructions (Addendum)
°  Avoid straining pelvic floor, abdominal muscles , spine  Use log rolling technique instead of getting out of bed with your neck or the sit-up     Log rolling into and out of bed   Log rolling into and out of bed If getting out of bed on R side, Bent knees, scoot hips/ shoulder to L  Raise R arm completely overhead, rolling onto armpit  Then lower bent knees to bed to get into complete side lying position  Then drop legs off bed, and push up onto R elbow/forearm, and use L hand to push onto the bed  __  Do exercises on both sides

## 2021-12-31 ENCOUNTER — Other Ambulatory Visit: Payer: Self-pay

## 2021-12-31 ENCOUNTER — Ambulatory Visit: Payer: Medicare Other | Admitting: Physical Therapy

## 2021-12-31 DIAGNOSIS — R296 Repeated falls: Secondary | ICD-10-CM | POA: Diagnosis not present

## 2021-12-31 DIAGNOSIS — M4126 Other idiopathic scoliosis, lumbar region: Secondary | ICD-10-CM | POA: Diagnosis not present

## 2021-12-31 DIAGNOSIS — R2689 Other abnormalities of gait and mobility: Secondary | ICD-10-CM

## 2021-12-31 DIAGNOSIS — R278 Other lack of coordination: Secondary | ICD-10-CM

## 2021-12-31 NOTE — Patient Instructions (Signed)
Multifidis twist  Red Band is on doorknob: stand further away from door (facing perpendicular)   Twisting trunk without moving the hips and knees Hold band at the level of ribcage, elbows bent,shoulder blades roll back and down like squeezing a pencil under armpit    Exhale twist,.10-15 deg away from door without moving your hips/ knees. Continue to maintain equal weight through legs. Keep knee unlocked.  20 reps    __  Stretches for low back  -dig elbows and feet into bed to scoot hips to L side and then lower hips. Rock knees to R from midline to 30 deg , several times ( 10 reps) to loosen low back Repeat with other side.    __  Remember to not hold breath when moving in / rolling in bed.   Remember to dig elbows and feet into bed before you lift hips over to scoot over
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
## 2022-01-07 ENCOUNTER — Ambulatory Visit: Payer: Medicare Other | Attending: Obstetrics and Gynecology | Admitting: Physical Therapy

## 2022-01-07 DIAGNOSIS — R2689 Other abnormalities of gait and mobility: Secondary | ICD-10-CM | POA: Insufficient documentation

## 2022-01-07 DIAGNOSIS — M4126 Other idiopathic scoliosis, lumbar region: Secondary | ICD-10-CM | POA: Insufficient documentation

## 2022-01-07 DIAGNOSIS — R296 Repeated falls: Secondary | ICD-10-CM | POA: Insufficient documentation

## 2022-01-07 DIAGNOSIS — M533 Sacrococcygeal disorders, not elsewhere classified: Secondary | ICD-10-CM | POA: Insufficient documentation

## 2022-01-07 DIAGNOSIS — R278 Other lack of coordination: Secondary | ICD-10-CM | POA: Insufficient documentation

## 2022-01-12 ENCOUNTER — Encounter: Payer: Medicare Other | Admitting: Physical Therapy

## 2022-01-14 ENCOUNTER — Other Ambulatory Visit: Payer: Self-pay

## 2022-01-14 ENCOUNTER — Ambulatory Visit: Payer: Medicare Other | Admitting: Obstetrics and Gynecology

## 2022-01-14 ENCOUNTER — Encounter: Payer: Self-pay | Admitting: Obstetrics and Gynecology

## 2022-01-14 ENCOUNTER — Ambulatory Visit: Payer: Medicare Other | Admitting: Physical Therapy

## 2022-01-14 VITALS — BP 138/74 | Ht 60.0 in | Wt 220.0 lb

## 2022-01-14 DIAGNOSIS — Z4689 Encounter for fitting and adjustment of other specified devices: Secondary | ICD-10-CM | POA: Diagnosis not present

## 2022-01-14 DIAGNOSIS — R2689 Other abnormalities of gait and mobility: Secondary | ICD-10-CM | POA: Diagnosis present

## 2022-01-14 DIAGNOSIS — R278 Other lack of coordination: Secondary | ICD-10-CM | POA: Diagnosis present

## 2022-01-14 DIAGNOSIS — N952 Postmenopausal atrophic vaginitis: Secondary | ICD-10-CM

## 2022-01-14 DIAGNOSIS — R296 Repeated falls: Secondary | ICD-10-CM

## 2022-01-14 DIAGNOSIS — M4126 Other idiopathic scoliosis, lumbar region: Secondary | ICD-10-CM

## 2022-01-14 DIAGNOSIS — M533 Sacrococcygeal disorders, not elsewhere classified: Secondary | ICD-10-CM | POA: Diagnosis not present

## 2022-01-14 MED ORDER — PREMARIN 0.625 MG/GM VA CREA: TOPICAL_CREAM | VAGINAL | 6 refills | Status: DC
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
## 2022-01-14 NOTE — Patient Instructions (Signed)
How to Use a Vaginal Pessary A vaginal pessary is a removable device that is placed into your vagina to support pelvic organs that droop. These organs include your uterus, bladder, and rectum. When your pelvic organs drop down into your vagina, it causes a condition called pelvic organ prolapse (POP). A pessary may be an alternative to surgery for women with POP. It may help women who leak urine when they strain or exercise (stress incontinence). This is a symptom of POP. A vaginal pessary may also be a temporary treatment for stress incontinence during pregnancy. There are several types of pessaries. All types are usually made of silicone. You can insert and remove some on your own. Other types must be inserted and removed by your health care provider at office visits. The reason you are using a pessary and the severity of your condition will determine which one is best for you. It is also important to find the right size. A pessary that is too small may fall out. A pessary that is too large may cause pain or discomfort. Your health care provider will do a physical exam to find the correct size and fit for your pessary. It may take several appointments to find the best fit for you. If you can be fit with the type of pessary that you can insert, remove, and clean yourself, your health care provider will teach you how to use your pessary at home. You may have checkups every few months. If you have the type of pessary that needs to be inserted and removed by your health care provider, you will have appointments every few months to have the pessary removed, cleaned, and replaced. What are the risks? When properly fitted and cared for, risks of using a vaginal pessary can be small. However, there can be problems that may include: Vaginal discharge. Vaginal bleeding. A bad smell coming from your vagina. Scraping of the skin inside your vagina. How to use your pessary Follow your health care provider's  instructions for using a pessary. These instructions may vary, depending on the type of pessary you have. To insert a pessary: Wash your hands with soap and water for at least 20 seconds. Squeeze or fold the pessary in half and lubricate the tip with a water-based lubricant. Insert the pessary into your vagina. It will unfold and provide support. To remove the pessary, gently tug it out of your vagina. You can remove the pessary every night or after several days. You can also remove it to have sex. How to care for your pessary If you have a pessary that you can remove: Clean your pessary with soap and water. Rinse well. Dry it completely before inserting it back into your vagina. Follow these instructions at home: Take over-the-counter and prescription medicines only as told by your health care provider. Your health care provider may prescribe an estrogen cream to moisten your vagina. Keep all follow-up visits. This is important. Contact a health care provider if: You feel any pain or discomfort when your pessary is in place. You continue to have stress incontinence. You have trouble keeping your pessary from falling out. You have an unusual vaginal discharge that is blood-tinged or smells bad. Summary A vaginal pessary is a removable device that is placed into your vagina to support pelvic organs that droop. This condition is called pelvic organ prolapse (POP). There are several types of pessaries. Some you can insert and remove on your own. Others must be inserted and removed  by your health care provider. The best type for you depends on the reason you are using a pessary and the severity of your condition. It is also important to find the right size. If you can use the type that you insert and remove on your own, your health care provider will teach you how to use it and schedule checkups every few months. If you have the type that needs to be inserted and removed by your health care  provider, you will have regular appointments to have your pessary removed, cleaned, and replaced. This information is not intended to replace advice given to you by your health care provider. Make sure you discuss any questions you have with your health care provider. Document Revised: 05/22/2020 Document Reviewed: 05/22/2020 Elsevier Patient Education  [place].

## 2022-01-14 NOTE — Patient Instructions (Signed)
Open book     Clam Shell 45 Degrees  Lying with hips and knees bent 45, one pillow between knees and ankles. Heel together, toes apart like ballerina,  Lift knee with exhale while pressing heels together. Be sure pelvis does not roll backward. Do not arch back. Do 20 times, each leg, 2 times per day.    __  Deep core level 1-2 * ( handout)

## 2022-01-14 NOTE — Progress Notes (Signed)
HPI:      Ms. Carrie Wheeler is a 81 y.o. G1P1 who presents today for her pessary follow up and examination related to her pelvic floor weakening.  Pt reports tolerating the pessary well with  no vaginal bleeding and  no vaginal discharge. She has noted a vaginal odor.  Symptoms of pelvic floor weakening have greatly improved. She is voiding and defecating without difficulty. She currently has a size 4 dish with knob pessary.  PMHx: She  has a past medical history of Environmental allergies, GERD (gastroesophageal reflux disease), Hyperlipidemia, and Hypertension. Also,  has a past surgical history that includes Tonsillectomy; Colonoscopy; Colonoscopy with propofol (N/A, 11/27/2015); Colonoscopy with propofol (N/A, 05/18/2021); Esophagogastroduodenoscopy (egd) with propofol (N/A, 05/18/2021); and polypectomy (N/A, 05/18/2021)., family history includes Alzheimer's disease in an other family member; Colon cancer in her father and another family member; Diabetes in her mother and another family member; Healthy in her brother; Heart Problems in her brother; Hypertension in her father; Liver disease in her son.,  reports that she has never smoked. She has never used smokeless tobacco. She reports that she does not drink alcohol and does not use drugs.  She has a current medication list which includes the following prescription(s): amlodipine, atorvastatin, biotin, calcium carbonate, cetirizine, vitamin d, premarin, fluticasone, lactobacillus, magnesium, mirabegron er, multiple vitamins-minerals, pantoprazole, tetrahydrozoline hcl, and turmeric. Also, has No Known Allergies.  Review of Systems  Constitutional:  Negative for chills, fever, malaise/fatigue and weight loss.  HENT:  Negative for congestion, hearing loss and sinus pain.   Eyes:  Negative for blurred vision and double vision.  Respiratory:  Negative for cough, sputum production, shortness of breath and wheezing.   Cardiovascular:  Negative for  chest pain, palpitations, orthopnea and leg swelling.  Gastrointestinal:  Negative for abdominal pain, constipation, diarrhea, nausea and vomiting.  Genitourinary:  Negative for dysuria, flank pain, frequency, hematuria and urgency.  Musculoskeletal:  Negative for back pain, falls and joint pain.  Skin:  Negative for itching and rash.  Neurological:  Negative for dizziness and headaches.  Psychiatric/Behavioral:  Negative for depression, substance abuse and suicidal ideas. The patient is not nervous/anxious.    Objective: BP 138/74    Ht 5' (1.524 m)    Wt 220 lb (99.8 kg)    BMI 42.97 kg/m  Physical Exam Constitutional:      Appearance: She is well-developed.  Genitourinary:     Genitourinary Comments: External: Normal appearing vulva. No lesions noted. Atrophic Speculum examination: Normal appearing cervix. No blood in the vaginal vault. No discharge.    HENT:     Head: Normocephalic and atraumatic.  Neck:     Thyroid: No thyromegaly.  Cardiovascular:     Rate and Rhythm: Normal rate and regular rhythm.     Heart sounds: Normal heart sounds.  Pulmonary:     Effort: Pulmonary effort is normal.     Breath sounds: Normal breath sounds.  Abdominal:     General: Bowel sounds are normal. There is no distension.     Palpations: Abdomen is soft. There is no mass.  Musculoskeletal:     Cervical back: Neck supple.  Neurological:     Mental Status: She is alert and oriented to person, place, and time.  Skin:    General: Skin is warm and dry.  Psychiatric:        Behavior: Behavior normal.        Thought Content: Thought content normal.  Judgment: Judgment normal.  Vitals reviewed.    Pessary Care Pessary removed and cleaned.  Vagina checked - without erosions - pessary replaced.  A/P:  Pessary was cleaned and replaced today. Instructions given for care. Concerning symptoms to observe for are counseled to patient. Follow up scheduled for 3 months. Rx for premarin  sent  A total of 20 minutes were spent face-to-face with the patient as well as preparation, review, communication, and documentation during this encounter.   Adrian Prows MD, [facility], [facility] 01/14/2022 11:18 AM

## 2022-01-18 ENCOUNTER — Other Ambulatory Visit: Payer: Self-pay

## 2022-01-18 ENCOUNTER — Ambulatory Visit: Payer: Medicare Other | Admitting: Physical Therapy

## 2022-01-18 DIAGNOSIS — M4126 Other idiopathic scoliosis, lumbar region: Secondary | ICD-10-CM

## 2022-01-18 DIAGNOSIS — M533 Sacrococcygeal disorders, not elsewhere classified: Secondary | ICD-10-CM | POA: Diagnosis not present

## 2022-01-18 DIAGNOSIS — R2689 Other abnormalities of gait and mobility: Secondary | ICD-10-CM

## 2022-01-18 DIAGNOSIS — R278 Other lack of coordination: Secondary | ICD-10-CM

## 2022-01-18 DIAGNOSIS — R296 Repeated falls: Secondary | ICD-10-CM | POA: Diagnosis not present
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
## 2022-01-19 ENCOUNTER — Encounter: Payer: Medicare Other | Admitting: Physical Therapy

## 2022-01-25 ENCOUNTER — Ambulatory Visit: Payer: Medicare Other | Admitting: Physical Therapy

## 2022-01-25 ENCOUNTER — Other Ambulatory Visit: Payer: Self-pay

## 2022-01-25 DIAGNOSIS — R2689 Other abnormalities of gait and mobility: Secondary | ICD-10-CM

## 2022-01-25 DIAGNOSIS — R278 Other lack of coordination: Secondary | ICD-10-CM | POA: Diagnosis not present

## 2022-01-25 DIAGNOSIS — R296 Repeated falls: Secondary | ICD-10-CM | POA: Diagnosis not present

## 2022-01-25 DIAGNOSIS — M533 Sacrococcygeal disorders, not elsewhere classified: Secondary | ICD-10-CM | POA: Diagnosis not present

## 2022-01-25 DIAGNOSIS — M4126 Other idiopathic scoliosis, lumbar region: Secondary | ICD-10-CM | POA: Diagnosis not present
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
## 2022-01-25 NOTE — Patient Instructions (Signed)
°  _________  Feet slides :   Points of contact at sitting bones  Four points of contact of foot,  Side knee back while keeping knee out along 2-3rd toe line   Heel up, ankle not twist out Lower heel while keeping knee out along 2-3rd toe line Four points of contact of foot, Slide foot back while keeping knee out along 2-3rd toe line   Repeated with other foot    2 min   __  Walk with hand on counter back and forth for 2 min  Higher knees, lower ballmounds first not the heels, , place foot hip witdth apart   ___ When logroling to get out of bed, make sure you are completely on your side And use outer  hand to press down to raise body up _________  Feet slides :   Points of contact at sitting bones  Four points of contact of foot,  Side knee back while keeping knee out along 2-3rd toe line   Heel up, ankle not twist out Lower heel while keeping knee out along 2-3rd toe line Four points of contact of foot, Slide foot back while keeping knee out along 2-3rd toe line   Repeated with other foot    2 min   __  Walk with hand on counter back and forth for 2 min  Higher knees, lower ballmounds first not the heels, , place foot hip witdth apart   ___ When logroling to get out of bed, make sure you are completely on your side And use outer  hand to press down to raise body up

## 2022-01-26 ENCOUNTER — Encounter: Payer: Medicare Other | Admitting: Physical Therapy

## 2022-02-01 ENCOUNTER — Ambulatory Visit: Payer: Medicare Other | Admitting: Physical Therapy

## 2022-02-01 ENCOUNTER — Other Ambulatory Visit: Payer: Self-pay

## 2022-02-01 DIAGNOSIS — R296 Repeated falls: Secondary | ICD-10-CM | POA: Diagnosis not present

## 2022-02-01 DIAGNOSIS — R2689 Other abnormalities of gait and mobility: Secondary | ICD-10-CM | POA: Diagnosis not present

## 2022-02-01 DIAGNOSIS — R278 Other lack of coordination: Secondary | ICD-10-CM | POA: Diagnosis not present

## 2022-02-01 DIAGNOSIS — M4126 Other idiopathic scoliosis, lumbar region: Secondary | ICD-10-CM | POA: Diagnosis not present

## 2022-02-01 DIAGNOSIS — M533 Sacrococcygeal disorders, not elsewhere classified: Secondary | ICD-10-CM | POA: Diagnosis not present
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
## 2022-02-02 ENCOUNTER — Encounter: Payer: Medicare Other | Admitting: Physical Therapy

## 2022-02-08 ENCOUNTER — Ambulatory Visit: Payer: Medicare Other | Admitting: Physical Therapy

## 2022-02-15 ENCOUNTER — Other Ambulatory Visit: Payer: Self-pay

## 2022-02-15 ENCOUNTER — Ambulatory Visit: Payer: Medicare Other | Attending: Obstetrics and Gynecology | Admitting: Physical Therapy

## 2022-02-15 DIAGNOSIS — R296 Repeated falls: Secondary | ICD-10-CM

## 2022-02-15 DIAGNOSIS — M4126 Other idiopathic scoliosis, lumbar region: Secondary | ICD-10-CM

## 2022-02-15 DIAGNOSIS — R2689 Other abnormalities of gait and mobility: Secondary | ICD-10-CM | POA: Diagnosis present

## 2022-02-15 DIAGNOSIS — R278 Other lack of coordination: Secondary | ICD-10-CM | POA: Diagnosis present

## 2022-02-15 NOTE — Patient Instructions (Addendum)
? ? ?  Lying on back with knees bent: ? ?DO QUICK KEGELS Quick Kegels  ? ?Inhale thru nose , widen ribs count of 1-2 pause  ? ?Exhale thru the nose, count of 2-1 pause  ? ?Quick squeeze  ? ?Repeat ? ?Use fingers to keep count of 5 reps  ? ? ? set  #1 ?__________________________________________________ ? ?DEEP CORE LEVEL 1 ( 10 breaths)  ? ? ?DEEP CORE LEVEL 2  ( 6 min)  ? ?______________________________________________________ ?DO QUICK KEGELS ? Quick Kegels  ? ?Inhale thru nose , widen ribs count of 1-2 pause  ? ?Exhale thru the nose, count of 2-1 pause  ? ?Quick squeeze  ? ?Repeat ? ?Use fingers to keep count of 5 reps  ? ?Set #2  ?___ ? ? ?Lying on your R  side: ? ?OPEN BOOK  stretches on R side  ?Clamshells 20 reps R side ? ?____ ? ? Lying on back with knees bent: ? ?DO QUICK KEGELS ?Quick Kegels  ? ?Inhale thru nose , widen ribs count of 1-2 pause  ? ?Exhale thru the nose, count of 2-1 pause  ? ?Quick squeeze  ? ?Repeat ? ?Use fingers to keep count of 5 reps  ? ?Set #3  ?________________________________________________ ?Lying on your L  side: ? ?OPEN BOOK  stretches on L side  ?Clamshells 20 reps L side ? ?___ ?Seated at edge of bed  ? ?FIGURE -4 stretch ( body is turned 45 deg on the edge of the bed )  ?Leaning forward 5 long breaths  ? ?
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
## 2022-02-22 ENCOUNTER — Encounter: Payer: Medicare Other | Admitting: Physical Therapy

## 2022-02-22 ENCOUNTER — Telehealth: Payer: Self-pay | Admitting: Gastroenterology

## 2022-02-22 MED ORDER — PANTOPRAZOLE SODIUM 40 MG PO TBEC: 40.0000 mg | DELAYED_RELEASE_TABLET | Freq: Every day | ORAL | 3 refills | Status: DC

## 2022-02-22 NOTE — Telephone Encounter (Signed)
Patient states she needs a refill on her prescription (pantoprazole 40 MG). Patient states the pharmacy has tried to contact us and we haven't gotten back to them. ?

## 2022-02-22 NOTE — Telephone Encounter (Signed)
Rx sent through e-scribe ?Med refill f/u appt scheduled for July  ?

## 2022-02-22 NOTE — Addendum Note (Signed)
Addended by: Lurlean Nanny on: 02/22/2022 10:31 AM ? ? Modules accepted: Orders ? ?

## 2022-03-01 ENCOUNTER — Ambulatory Visit: Payer: Medicare Other | Admitting: Physical Therapy

## 2022-03-01 ENCOUNTER — Other Ambulatory Visit: Payer: Self-pay

## 2022-03-01 ENCOUNTER — Telehealth: Payer: Self-pay

## 2022-03-01 DIAGNOSIS — R296 Repeated falls: Secondary | ICD-10-CM | POA: Diagnosis not present

## 2022-03-01 DIAGNOSIS — R278 Other lack of coordination: Secondary | ICD-10-CM | POA: Diagnosis not present

## 2022-03-01 DIAGNOSIS — M4126 Other idiopathic scoliosis, lumbar region: Secondary | ICD-10-CM | POA: Diagnosis not present

## 2022-03-01 DIAGNOSIS — R2689 Other abnormalities of gait and mobility: Secondary | ICD-10-CM

## 2022-03-01 NOTE — Telephone Encounter (Signed)
Patient scheduled.

## 2022-03-01 NOTE — Telephone Encounter (Signed)
Copied from [place] [id number]. Topic: General - Other ?>> Mar 01, 2022  9:11 AM Yvette Rack wrote: ?Reason for CRM: Dr. Aurea Graff reports 2 areas of concern: non-painful lump on right thigh above knee and sleep study due to risk factors of OSA frequent urination and nocturia 3-4 times a night. Dr. Aurea Graff requests that pt be contacted. ?
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
## 2022-03-01 NOTE — Telephone Encounter (Signed)
It looks like that is her PT.  Ok to call her and get her set up with an appt to discuss these concerns. ?

## 2022-03-01 NOTE — Patient Instructions (Addendum)
Heel raises with both heels up together, both hands on counter ? 20 reps  ? ?Calf / hamstring stretch, straighten/bend knee  ?10 reps  ? ?___ ? ? ?Remember tpo squeeze before you cough ? ?___ ? ?Follow up with primary care doctor about sleep study and the bump on R thigh ?

## 2022-03-13 ENCOUNTER — Other Ambulatory Visit: Payer: Self-pay | Admitting: Family Medicine

## 2022-03-13 DIAGNOSIS — I10 Essential (primary) hypertension: Secondary | ICD-10-CM

## 2022-03-15 NOTE — Progress Notes (Signed)
? ?I,Elena D DeSanto,acting as a scribe for [name], MD.,have documented all relevant documentation on the behalf of [name], MD,as directed by  [name], MD while in the presence of [name], MD. ?  ? ? ?Established patient visit ? ? ?Patient: Carrie Wheeler   DOB: [birth year]   81 y.o. Female  MRN: [id number] ?Visit Date: 03/16/2022 ? ?Today's healthcare provider: [name], MD  ? ?Chief Complaint  ?Patient presents with  ? Snoring  ? ?Subjective  ?  ?HPI  ?Patient here today C/O non-painful lump on right thigh above knee.   She states it has been there for years.  It has not changed and does not bother her.  However, Dr Aurea Graff noticed it and wanted to have it noted by her PCP. The patient says she has had it checked in the past but did not remember by whom. ? ?Patient also states that Dr Aurea Graff thinks she should have a sleep study due to risk factors of OSA.  She admits to snoring but is not aware if she has episode of apnea. Non-restorative sleep. Some daytime drowsiness. No bed partner. No AM headaches. ? ?Patient also C/O frequent urination and nocturia 3-4 times a night. Patient states this is not something new but getting progressively worse.  She had been using Myrbertiq but had stopped and is doing therapy instead. Doesn't want to restart that.  ? ?Medications: ?Outpatient Medications Prior to Visit  ?Medication Sig  ? amLODipine (NORVASC) 10 MG tablet TAKE 1 TABLET(10 MG) BY MOUTH DAILY  ? atorvastatin (LIPITOR) 40 MG tablet Take 1 tablet (40 mg total) by mouth every other day.  ? BIOTIN PO Take 2 tablets by mouth daily. Unsure dose  ? calcium carbonate (OS-CAL) 600 MG TABS tablet Take 1 tablet by mouth daily.  ? cetirizine (ZYRTEC) 10 MG tablet Take 1 tablet by mouth daily. As needed  ? Cholecalciferol (VITAMIN D) 2000 UNITS CAPS Take by mouth daily.   ? conjugated estrogens (PREMARIN) vaginal cream Place 1 Applicatorful vaginally daily for 28 days, THEN 1  Applicatorful 2 (two) times a week.  ? fluticasone (FLONASE) 50 MCG/ACT nasal spray SHAKE LIQUID AND USE 2 SPRAYS IN EACH NOSTRIL EVERY DAY  ? Lactobacillus (PROBIOTIC ACIDOPHILUS PO) Take 1 tablet by mouth daily.  ? MAGNESIUM PO Take by mouth.  ? pantoprazole (PROTONIX) 40 MG tablet Take 1 tablet (40 mg total) by mouth daily.  ? mirabegron ER (MYRBETRIQ) 25 MG TB24 tablet Take 1 tablet (25 mg total) by mouth daily. (Patient not taking: Reported on 03/16/2022)  ? Multiple Vitamins-Minerals (ZINC PO) Take by mouth. (Patient not taking: Reported on 03/16/2022)  ? Tetrahydrozoline HCl (VISINE RED EYE COMFORT OP) Place 1 drop into both eyes daily. (Patient not taking: Reported on 03/16/2022)  ? TURMERIC PO Take by mouth. (Patient not taking: Reported on 03/16/2022)  ? ?No facility-administered medications prior to visit.  ? ? ?Review of Systems  ?Respiratory:  Positive for apnea (possibly, but does snore).   ?Genitourinary:  Positive for enuresis, frequency and urgency. Negative for difficulty urinating and dysuria.  ? ? ?  Objective  ?  ?BP 123/74 (BP Location: Left Arm, Patient Position: Sitting, Cuff Size: Normal)   Pulse 85   Temp 97.8 ?F (36.6 ?C) (Oral)   Wt 221 lb (100.2 kg)   SpO2 98%   BMI 43.16 kg/m?  ? ? ?Physical Exam ?Vitals reviewed.  ?Constitutional:   ?   General: She is not in acute  distress. ?   Appearance: Normal appearance. She is well-developed. She is not diaphoretic.  ?HENT:  ?   Head: Normocephalic and atraumatic.  ?Eyes:  ?   General: No scleral icterus. ?   Conjunctiva/sclera: Conjunctivae normal.  ?Neck:  ?   Thyroid: No thyromegaly.  ?Cardiovascular:  ?   Rate and Rhythm: Normal rate and regular rhythm.  ?   Pulses: Normal pulses.  ?   Heart sounds: Normal heart sounds. No murmur heard. ?Pulmonary:  ?   Effort: Pulmonary effort is normal. No respiratory distress.  ?   Breath sounds: Normal breath sounds. No wheezing, rhonchi or rales.  ?Musculoskeletal:  ?   Cervical back: Neck supple.  ?    Right lower leg: No edema.  ?   Left lower leg: No edema.  ?   Comments: Lipoma (mobile, non tender) of R thigh  ?Lymphadenopathy:  ?   Cervical: No cervical adenopathy.  ?Skin: ?   General: Skin is warm and dry.  ?   Findings: No rash.  ?Neurological:  ?   Mental Status: She is alert and oriented to person, place, and time. Mental status is at baseline.  ?Psychiatric:     ?   Mood and Affect: Mood normal.     ?   Behavior: Behavior normal.  ?  ? ? ?  03/16/2022  ?  8:00 AM  ?Results of the [facility] flowsheet  ?Sitting and reading 1  ?Watching TV 1  ?Sitting, inactive in a public place (e.g. a theatre or a meeting) 1  ?As a passenger in a car for an hour without a break 0  ?Lying down to rest in the afternoon when circumstances permit 3  ?Sitting and talking to someone 0  ?Sitting quietly after a lunch without alcohol 0  ?In a car, while stopped for a few minutes in traffic 0  ?Total score 6  ? ? ? ?No results found for any visits on 03/16/22. ? Assessment & Plan  ?  ? ?1. Non-restorative sleep ?2. Snoring ?- some signs of possible OSA ?- she does have RF including HTN and morbid obesity ?- will refer for sleep study (PSG) ?- further management pending results ?- Ambulatory referral to Sleep Studies ? ?3. Lipoma of right lower extremity ?- present and stable for many years ?- discussed benign nature and no need for intervention as long as not growing or painful ? ?4. OAB (overactive bladder) ?- longstanding ?- improving with pelvic floor PT - will continue ?- does not want to resume Myrbetriq at this time ?  ? ?Return in about 4 weeks (around 04/13/2022) for chronic disease f/u, as scheduled.  ?   ? ?I, [name], MD, have reviewed all documentation for this visit. The documentation on 03/16/22 for the exam, diagnosis, procedures, and orders are all accurate and complete. ? ? ?Virginia Crews, MD, MPH ?[place] ?[facility]   ?

## 2022-03-16 ENCOUNTER — Encounter: Payer: Self-pay | Admitting: Family Medicine

## 2022-03-16 ENCOUNTER — Ambulatory Visit (INDEPENDENT_AMBULATORY_CARE_PROVIDER_SITE_OTHER): Payer: Medicare Other | Admitting: Family Medicine

## 2022-03-16 VITALS — BP 123/74 | HR 85 | Temp 97.8°F | Wt 221.0 lb

## 2022-03-16 DIAGNOSIS — R0683 Snoring: Secondary | ICD-10-CM

## 2022-03-16 DIAGNOSIS — G478 Other sleep disorders: Secondary | ICD-10-CM

## 2022-03-16 DIAGNOSIS — N3281 Overactive bladder: Secondary | ICD-10-CM | POA: Diagnosis not present

## 2022-03-16 DIAGNOSIS — D1723 Benign lipomatous neoplasm of skin and subcutaneous tissue of right leg: Secondary | ICD-10-CM | POA: Diagnosis not present

## 2022-03-31 ENCOUNTER — Ambulatory Visit: Payer: Medicare Other | Attending: Obstetrics and Gynecology | Admitting: Physical Therapy

## 2022-03-31 DIAGNOSIS — R296 Repeated falls: Secondary | ICD-10-CM | POA: Diagnosis not present

## 2022-03-31 DIAGNOSIS — M4126 Other idiopathic scoliosis, lumbar region: Secondary | ICD-10-CM | POA: Insufficient documentation

## 2022-03-31 DIAGNOSIS — R2689 Other abnormalities of gait and mobility: Secondary | ICD-10-CM | POA: Insufficient documentation

## 2022-03-31 DIAGNOSIS — R278 Other lack of coordination: Secondary | ICD-10-CM | POA: Diagnosis not present
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
## 2022-03-31 NOTE — Patient Instructions (Signed)
1) Heel raises both feet with both hands on the counter  ?10 reps  ? ?__ ? ?2)  Lunge stretch for the calf, face parallel with counter , back heel raises up and down ?10 reps and  ? ?3)  Mini squat ( feet wider than hips, knees behind the toes, inhale down, exhale up, ?10 reps  with hand on the counter   ( this is the way for picking up things off floor and weeding)  ? ? ?switch to the other side other hand at the counter  ? ? ?___ ? ? ?

## 2022-04-02 ENCOUNTER — Encounter: Payer: Self-pay | Admitting: Obstetrics and Gynecology

## 2022-04-02 ENCOUNTER — Ambulatory Visit: Payer: Medicare Other | Admitting: Obstetrics and Gynecology

## 2022-04-02 VITALS — BP 154/77 | HR 87

## 2022-04-02 DIAGNOSIS — N3281 Overactive bladder: Secondary | ICD-10-CM

## 2022-04-02 DIAGNOSIS — N816 Rectocele: Secondary | ICD-10-CM | POA: Diagnosis not present

## 2022-04-02 NOTE — Progress Notes (Signed)
[facility] ?Return Visit ? ?SUBJECTIVE  ?History of Present Illness: ?Carrie Wheeler is a 81 y.o. female seen in follow-up for stage II posterior prolapse and overactive bladder. She has been attending physical therapy.  ? ?She has completed pelvic PT. Still leaking throughout the day and waking up frequently at night. She does not want to start another medication at this time.  ? ?She is using premarin. Feels the pessary is not in the right place. Last changed by Dr Gilman Schmidt in Feb.  ? ?Past Medical History: ?Patient  has a past medical history of Environmental allergies, GERD (gastroesophageal reflux disease), Hyperlipidemia, and Hypertension.  ? ?Past Surgical History: ?She  has a past surgical history that includes Tonsillectomy; Colonoscopy; Colonoscopy with propofol (N/A, 11/27/2015); Colonoscopy with propofol (N/A, 05/18/2021); Esophagogastroduodenoscopy (egd) with propofol (N/A, 05/18/2021); and polypectomy (N/A, 05/18/2021).  ? ?Medications: ?She has a current medication list which includes the following prescription(s): amlodipine, atorvastatin, biotin, calcium carbonate, cetirizine, vitamin d, premarin, fluticasone, lactobacillus, magnesium, multiple vitamins-minerals, and pantoprazole.  ? ?Allergies: ?Patient has No Known Allergies.  ? ?Social History: ?Patient  reports that she has never smoked. She has never used smokeless tobacco. She reports that she does not drink alcohol and does not use drugs.  ?  ?  ?OBJECTIVE  ?  ? ?Physical Exam: ?Vitals:  ? 04/02/22 0815  ?BP: (!) 154/77  ?Pulse: 87  ? ?Gen: No apparent distress, A&O x 3. ? ?Detailed Urogynecologic Evaluation:  ?Pessary was noted to be in place. It was removed, and cleaned. Speculum exam reveals normal mucosa with no lesions. Pessary replaced and felt comfortable.  ? ?Prior exam showed: ? ?POP-Q (10/14/21):  ?  ?POP-Q ?  ?-2  ?                                          Aa   ?-2 ?                                          Ba   ?-6  ?                                             C  ?  ?2.5  ?                                          Gh   ?3.5  ?                                          Pb   ?9  ?                                          tvl  ?  ?0  ?  Ap   ?0  ?                                          Bp   ?-7  ?                                            D  ?  ?   ? ?ASSESSMENT AND PLAN  ?  ?Carrie Wheeler is a 81 y.o. with:  ?1. Overactive bladder   ?2. Prolapse of posterior vaginal wall   ? ?OAB ?- Discussed option of alternative medication vs tibial nerve stimulation. She will consider PTNS but does not want treatment at this time.  ? ?2. POP ?- Pessary cleaned and replaced today ?- continue premarin cream twice a week ?- pessary cleanings q 3 months. She can continue here or back in [place] once she finds a new physician there.  ? ?Jaquita Folds, MD ? ?Time spent: I spent 20 minutes dedicated to the care of this patient on the date of this encounter to include pre-visit review of records, face-to-face time with the patient and post visit documentation. ? ?

## 2022-04-16 NOTE — Progress Notes (Signed)
?  ? ?I,[name],acting as a scribe for [name], MD.,have documented all relevant documentation on the behalf of [name], MD,as directed by  [name], MD while in the presence of [name], MD. ? ? ?Established patient visit ? ? ?Patient: Carrie Wheeler   DOB: 09-12-1941   81 y.o. Female  MRN: [id number] ?Visit Date: 04/20/2022 ? ?Today's healthcare provider: [name], MD  ? ?Chief Complaint  ?Patient presents with  ? Hypertension  ? Hyperlipidemia  ? Hyperglycemia  ? ?Subjective  ?  ?HPI  ?Hypertension, follow-up ? ?BP Readings from Last 3 Encounters:  ?04/20/22 130/72  ?04/02/22 (!) 154/77  ?03/16/22 123/74  ? Wt Readings from Last 3 Encounters:  ?04/20/22 221 lb (100.2 kg)  ?03/16/22 221 lb (100.2 kg)  ?01/14/22 220 lb (99.8 kg)  ?  ? ?She was last seen for hypertension 6 months ago.  ?BP at that visit was 128/60. Management since that visit includes no changes. ? ?She reports excellent compliance with treatment. ?She is not having side effects.  ?She is following a Regular diet. ?She is not exercising. ?She does not smoke. ? ?Use of agents associated with hypertension: none.  ? ?Outside blood pressures are not being checked. ?Symptoms: ?No chest pain No chest pressure  ?No palpitations No syncope  ?No dyspnea No orthopnea  ?No paroxysmal nocturnal dyspnea Yes lower extremity edema  ? ?Pertinent labs ?Lab Results  ?Component Value Date  ? CHOL 168 10/20/2021  ? HDL 58 10/20/2021  ? [place] 96 10/20/2021  ? TRIG 76 10/20/2021  ? CHOLHDL 2.9 10/20/2021  ? Lab Results  ?Component Value Date  ? NA 146 (H) 10/20/2021  ? K 5.0 10/20/2021  ? CREATININE 0.99 10/20/2021  ? EGFR 59 (L) 10/20/2021  ? GLUCOSE 88 10/20/2021  ? TSH 2.590 08/30/2016  ?  ? ?The 10-year ASCVD risk score (Arnett DK, et al., 2019) is: 29.3% ? ?--------------------------------------------------------------------------------------------------- ?Prediabetes, Follow-up ? ?Lab Results  ?Component  Value Date  ? HGBA1C 5.7 (H) 10/20/2021  ? HGBA1C 5.4 04/13/2021  ? HGBA1C 5.8 (H) 10/14/2020  ? GLUCOSE 88 10/20/2021  ? GLUCOSE 92 10/14/2020  ? GLUCOSE 95 04/10/2020  ? ? ?Last seen for for this6 months ago.  ?Management since that visit includes no changes. ?Current symptoms include none and have been stable. ? ?Prior visit with dietician: no ?Current diet: in general, a "healthy" diet   ?Current exercise:  stretching ? ?Pertinent Labs: ?   ?Component Value Date/Time  ? CHOL 168 10/20/2021 0930  ? TRIG 76 10/20/2021 0930  ? CHOLHDL 2.9 10/20/2021 0930  ? CREATININE 0.99 10/20/2021 0930  ? CREATININE 0.67 03/02/2015 1207  ? ? ?Wt Readings from Last 3 Encounters:  ?04/20/22 221 lb (100.2 kg)  ?03/16/22 221 lb (100.2 kg)  ?01/14/22 220 lb (99.8 kg)  ? ? ?----------------------------------------------------------------------------------------- ?Lipid/Cholesterol, Follow-up ? ?Last lipid panel Other pertinent labs  ?Lab Results  ?Component Value Date  ? CHOL 168 10/20/2021  ? HDL 58 10/20/2021  ? [place] 96 10/20/2021  ? TRIG 76 10/20/2021  ? CHOLHDL 2.9 10/20/2021  ? Lab Results  ?Component Value Date  ? ALT 14 10/20/2021  ? AST 17 10/20/2021  ? PLT 256 10/05/2018  ? TSH 2.590 08/30/2016  ?  ? ?She was last seen for this 6 months ago.  ?Management since that visit includes no changes. ? ?She reports excellent compliance with treatment. ?She is not having side effects.  ? ?Symptoms: ?No chest pain No chest  pressure/discomfort  ?No dyspnea Yes lower extremity edema  ?No numbness or tingling of extremity No orthopnea  ?No palpitations No paroxysmal nocturnal dyspnea  ?No speech difficulty No syncope  ? ? ?The 10-year ASCVD risk score (Arnett DK, et al., 2019) is: 29.3% ? ?--------------------------------------------------------------------------------------------------- ? ? ?Medications: ?Outpatient Medications Prior to Visit  ?Medication Sig  ? amLODipine (NORVASC) 10 MG tablet TAKE 1 TABLET(10 MG) BY MOUTH DAILY  ?  atorvastatin (LIPITOR) 40 MG tablet Take 1 tablet (40 mg total) by mouth every other day.  ? BIOTIN PO Take 2 tablets by mouth daily. Unsure dose  ? calcium carbonate (OS-CAL) 600 MG TABS tablet Take 1 tablet by mouth daily.  ? cetirizine (ZYRTEC) 10 MG tablet Take 1 tablet by mouth daily. As needed  ? Cholecalciferol (VITAMIN D) 2000 UNITS CAPS Take by mouth daily.   ? conjugated estrogens (PREMARIN) vaginal cream Place 1 Applicatorful vaginally daily for 28 days, THEN 1 Applicatorful 2 (two) times a week.  ? fluticasone (FLONASE) 50 MCG/ACT nasal spray SHAKE LIQUID AND USE 2 SPRAYS IN EACH NOSTRIL EVERY DAY  ? Lactobacillus (PROBIOTIC ACIDOPHILUS PO) Take 1 tablet by mouth daily.  ? pantoprazole (PROTONIX) 40 MG tablet Take 1 tablet (40 mg total) by mouth daily.  ? [DISCONTINUED] MAGNESIUM PO Take by mouth.  ? [DISCONTINUED] Multiple Vitamins-Minerals (ZINC PO) Take by mouth.  ? ?No facility-administered medications prior to visit.  ? ? ?Review of Systems  ?Constitutional:  Negative for appetite change.  ?Respiratory:  Negative for chest tightness and shortness of breath.   ?Cardiovascular:  Positive for leg swelling. Negative for chest pain and palpitations.  ? ? ?  Objective  ?  ?BP 130/72 (BP Location: Left Arm, Patient Position: Sitting, Cuff Size: Large)   Pulse 84   Temp 98 ?F (36.7 ?C) (Oral)   Resp 16   Wt 221 lb (100.2 kg)   SpO2 99%   BMI 43.16 kg/m?  ? ? ?Physical Exam ?Vitals reviewed.  ?Constitutional:   ?   General: She is not in acute distress. ?   Appearance: Normal appearance. She is well-developed. She is not diaphoretic.  ?HENT:  ?   Head: Normocephalic and atraumatic.  ?Eyes:  ?   General: No scleral icterus. ?   Conjunctiva/sclera: Conjunctivae normal.  ?Neck:  ?   Thyroid: No thyromegaly.  ?Cardiovascular:  ?   Rate and Rhythm: Normal rate and regular rhythm.  ?   Pulses: Normal pulses.  ?   Heart sounds: Normal heart sounds. No murmur heard. ?Pulmonary:  ?   Effort: Pulmonary effort is  normal. No respiratory distress.  ?   Breath sounds: Normal breath sounds. No wheezing, rhonchi or rales.  ?Musculoskeletal:  ?   Cervical back: Neck supple.  ?   Right lower leg: Edema present.  ?   Left lower leg: Edema present.  ?Lymphadenopathy:  ?   Cervical: No cervical adenopathy.  ?Skin: ?   General: Skin is warm and dry.  ?Neurological:  ?   Mental Status: She is alert and oriented to person, place, and time. Mental status is at baseline.  ?Psychiatric:     ?   Mood and Affect: Mood normal.     ?   Behavior: Behavior normal.  ?  ? ? ?No results found for any visits on 04/20/22. ? Assessment & Plan  ?  ? ?Problem List Items Addressed This Visit   ? ?  ? Cardiovascular and Mediastinum  ? Essential (primary) hypertension -  Primary  ?  Well controlled ?Continue current medications ?Is having some LE edema so may consider switching from amlodipine to HCTZ in the future - patient decliens at this time - encourage elevation and compression ?Recheck metabolic panel ?F/u in 6 months  ?  ?  ? Relevant Orders  ? Comprehensive metabolic panel  ?  ? Other  ? Hyperlipidemia  ?  Previously well controlled ?Continue statin ?Repeat FLP and CMP ?  ?  ? Relevant Orders  ? Lipid Panel With LDL/HDL Ratio  ? Morbid obesity ([place])  ?  Discussed importance of healthy weight management ?Discussed diet and exercise  ?  ?  ? Prediabetes  ?  Recommend low carb diet ?Recheck A1c  ?  ?  ? Relevant Orders  ? Hemoglobin A1c  ?  ? ?Return in about 6 months (around 10/21/2022) for AWV, CPE.  ?   ? ?I, [name], MD, have reviewed all documentation for this visit. The documentation on 04/20/22 for the exam, diagnosis, procedures, and orders are all accurate and complete. ? ? ?Virginia Crews, MD, MPH ?[place] ?[facility]   ?

## 2022-04-20 ENCOUNTER — Ambulatory Visit (INDEPENDENT_AMBULATORY_CARE_PROVIDER_SITE_OTHER): Payer: Medicare Other | Admitting: Family Medicine

## 2022-04-20 ENCOUNTER — Encounter: Payer: Self-pay | Admitting: Family Medicine

## 2022-04-20 VITALS — BP 130/72 | HR 84 | Temp 98.0°F | Resp 16 | Wt 221.0 lb

## 2022-04-20 DIAGNOSIS — E7800 Pure hypercholesterolemia, unspecified: Secondary | ICD-10-CM

## 2022-04-20 DIAGNOSIS — I10 Essential (primary) hypertension: Secondary | ICD-10-CM

## 2022-04-20 DIAGNOSIS — R7303 Prediabetes: Secondary | ICD-10-CM | POA: Diagnosis not present

## 2022-04-20 DIAGNOSIS — R748 Abnormal levels of other serum enzymes: Secondary | ICD-10-CM | POA: Diagnosis not present

## 2022-04-20 NOTE — Assessment & Plan Note (Signed)
Recommend low carb diet °Recheck A1c  °

## 2022-04-20 NOTE — Assessment & Plan Note (Signed)
Previously well controlled Continue statin Repeat FLP and CMP  

## 2022-04-20 NOTE — Assessment & Plan Note (Signed)
Discussed importance of healthy weight management Discussed diet and exercise  

## 2022-04-20 NOTE — Assessment & Plan Note (Addendum)
Well controlled ?Continue current medications ?Is having some LE edema so may consider switching from amlodipine to HCTZ in the future - patient decliens at this time - encourage elevation and compression ?Recheck metabolic panel ?F/u in 6 months  ?

## 2022-04-21 ENCOUNTER — Ambulatory Visit: Payer: Medicare Other | Attending: Neurology

## 2022-04-21 DIAGNOSIS — G4733 Obstructive sleep apnea (adult) (pediatric): Secondary | ICD-10-CM | POA: Diagnosis not present

## 2022-04-21 DIAGNOSIS — R0683 Snoring: Secondary | ICD-10-CM | POA: Insufficient documentation

## 2022-04-21 DIAGNOSIS — G478 Other sleep disorders: Secondary | ICD-10-CM | POA: Insufficient documentation

## 2022-04-21 LAB — COMPREHENSIVE METABOLIC PANEL
ALT: 13 IU/L (ref 0–32)
AST: 14 IU/L (ref 0–40)
Albumin/Globulin Ratio: 1.8 (ref 1.2–2.2)
Albumin: 4.4 g/dL (ref 3.7–4.7)
Alkaline Phosphatase: 145 IU/L — ABNORMAL HIGH (ref 44–121)
BUN/Creatinine Ratio: 22 (ref 12–28)
BUN: 23 mg/dL (ref 8–27)
Bilirubin Total: 0.6 mg/dL (ref 0.0–1.2)
CO2: 25 mmol/L (ref 20–29)
Calcium: 9.6 mg/dL (ref 8.7–10.3)
Chloride: 104 mmol/L (ref 96–106)
Creatinine, Ser: 1.04 mg/dL — ABNORMAL HIGH (ref 0.57–1.00)
Globulin, Total: 2.4 g/dL (ref 1.5–4.5)
Glucose: 94 mg/dL (ref 70–99)
Potassium: 5.1 mmol/L (ref 3.5–5.2)
Sodium: 144 mmol/L (ref 134–144)
Total Protein: 6.8 g/dL (ref 6.0–8.5)
eGFR: 55 mL/min/{1.73_m2} — ABNORMAL LOW (ref >59)

## 2022-04-21 LAB — LIPID PANEL WITH LDL/HDL RATIO
Cholesterol, Total: 166 mg/dL (ref 100–199)
HDL: 56 mg/dL (ref >39)
LDL Chol Calc (NIH): 97 mg/dL (ref 0–99)
LDL/HDL Ratio: 1.7 ratio (ref 0.0–3.2)
Triglycerides: 70 mg/dL (ref 0–149)
VLDL Cholesterol Cal: 13 mg/dL (ref 5–40)

## 2022-04-21 LAB — HEMOGLOBIN A1C
Est. average glucose Bld gHb Est-mCnc: 117 mg/dL
Hgb A1c MFr Bld: 5.7 % — ABNORMAL HIGH (ref 4.8–5.6)

## 2022-04-22 ENCOUNTER — Other Ambulatory Visit: Payer: Self-pay

## 2022-04-22 MED ORDER — HYDROCHLOROTHIAZIDE 25 MG PO TABS: 25.0000 mg | ORAL_TABLET | Freq: Every day | ORAL | 0 refills | Status: DC

## 2022-04-26 ENCOUNTER — Telehealth: Payer: Self-pay

## 2022-04-26 DIAGNOSIS — R748 Abnormal levels of other serum enzymes: Secondary | ICD-10-CM

## 2022-04-26 LAB — ALKALINE PHOSPHATASE, ISOENZYMES
Alkaline Phosphatase: 149 IU/L — ABNORMAL HIGH (ref 44–121)
BONE FRACTION: 27 % (ref 14–68)
INTESTINAL FRAC.: 1 % (ref 0–18)
LIVER FRACTION: 72 % (ref 18–85)

## 2022-04-26 LAB — SPECIMEN STATUS REPORT

## 2022-04-26 NOTE — Telephone Encounter (Signed)
-----  Message from Virginia Crews, MD sent at 04/26/2022  9:01 AM EDT ----- Elevated Alk phos is primarily from the liver. Recommend RUQ [place] to evaluate further.

## 2022-04-26 NOTE — Telephone Encounter (Signed)
Patient advised. Verbalized understanding. Order pended. Please check if correct order?

## 2022-04-27 ENCOUNTER — Encounter: Payer: Medicare Other | Admitting: Physical Therapy

## 2022-05-04 ENCOUNTER — Ambulatory Visit
Admission: RE | Admit: 2022-05-04 | Discharge: 2022-05-04 | Disposition: A | Discharge status: Home / Self Care | Payer: Medicare Other | Source: Ambulatory Visit | Attending: Family Medicine | Admitting: Family Medicine

## 2022-05-04 DIAGNOSIS — R945 Abnormal results of liver function studies: Secondary | ICD-10-CM | POA: Diagnosis not present

## 2022-05-04 DIAGNOSIS — R748 Abnormal levels of other serum enzymes: Secondary | ICD-10-CM | POA: Insufficient documentation

## 2022-05-21 NOTE — Progress Notes (Unsigned)
I,Carrie Wheeler,acting as a scribe for [name], MD.,have documented all relevant documentation on the behalf of [name], MD,as directed by  [name], MD while in the presence of [name], MD.  Established patient visit   Patient: Carrie Wheeler   DOB: [birth year]   81 y.o. Female  MRN: [id number] Visit Date: 05/24/2022  Today's healthcare provider: [name], MD   Chief Complaint  Patient presents with   Follow-up HTN   Subjective    HPI  Hypertension, follow-up  BP Readings from Last 3 Encounters:  05/24/22 124/81  04/20/22 130/72  04/02/22 (!) 154/77   Wt Readings from Last 3 Encounters:  05/24/22 216 lb 11.2 oz (98.3 kg)  04/20/22 221 lb (100.2 kg)  03/16/22 221 lb (100.2 kg)     She was last seen for hypertension 1 months ago.  BP at that visit was 130/72. Management since that visit includes Continue current medications Is having some LE edema so may consider switching from amlodipine to HCTZ in the future .  She reports excellent compliance with treatment. She is not having side effects.    Outside blood pressures are upper 150-140's/70-80's. Some in the 120's/70's Symptoms: No chest pain No chest pressure  No palpitations No syncope  No dyspnea No orthopnea  No paroxysmal nocturnal dyspnea No lower extremity edema   Pertinent labs Lab Results  Component Value Date   CHOL 166 04/20/2022   HDL 56 04/20/2022   LDLCALC 97 04/20/2022   TRIG 70 04/20/2022   CHOLHDL 2.9 10/20/2021   Lab Results  Component Value Date   NA 144 04/20/2022   K 5.1 04/20/2022   CREATININE 1.04 (H) 04/20/2022   EGFR 55 (L) 04/20/2022   GLUCOSE 94 04/20/2022   TSH 2.590 08/30/2016     The 10-year ASCVD risk score (Arnett DK, et al., 2019) is: 27%  ---------------------------------------------------------------------------  Medications: Outpatient Medications Prior to Visit  Medication Sig   atorvastatin (LIPITOR) 40  MG tablet Take 1 tablet (40 mg total) by mouth every other day.   BIOTIN PO Take 2 tablets by mouth daily. Unsure dose   calcium carbonate (OS-CAL) 600 MG TABS tablet Take 1 tablet by mouth daily.   cetirizine (ZYRTEC) 10 MG tablet Take 1 tablet by mouth daily. As needed   Cholecalciferol (VITAMIN D) 2000 UNITS CAPS Take by mouth daily.    conjugated estrogens (PREMARIN) vaginal cream Place 1 Applicatorful vaginally daily for 28 days, THEN 1 Applicatorful 2 (two) times a week.   fluticasone (FLONASE) 50 MCG/ACT nasal spray SHAKE LIQUID AND USE 2 SPRAYS IN EACH NOSTRIL EVERY DAY   hydrochlorothiazide (HYDRODIURIL) 25 MG tablet Take 1 tablet (25 mg total) by mouth daily.   Lactobacillus (PROBIOTIC ACIDOPHILUS PO) Take 1 tablet by mouth daily.   pantoprazole (PROTONIX) 40 MG tablet Take 1 tablet (40 mg total) by mouth daily.   No facility-administered medications prior to visit.    Review of Systems     Objective    BP 124/81 (BP Location: Left Arm, Patient Position: Sitting, Cuff Size: Large)   Pulse 76   Temp 97.7 F (36.5 C) (Oral)   Resp 16   Wt 216 lb 11.2 oz (98.3 kg)   BMI 42.32 kg/m    Physical Exam Vitals reviewed.  Constitutional:      General: She is not in acute distress.    Appearance: Normal appearance. She is well-developed. She is not diaphoretic.  HENT:  Head: Normocephalic and atraumatic.  Eyes:     General: No scleral icterus.    Conjunctiva/sclera: Conjunctivae normal.  Neck:     Thyroid: No thyromegaly.  Cardiovascular:     Rate and Rhythm: Normal rate and regular rhythm.     Pulses: Normal pulses.     Heart sounds: Normal heart sounds. No murmur heard. Pulmonary:     Effort: Pulmonary effort is normal. No respiratory distress.     Breath sounds: Normal breath sounds. No wheezing, rhonchi or rales.  Musculoskeletal:     Cervical back: Neck supple.     Right lower leg: No edema.     Left lower leg: No edema.  Lymphadenopathy:     Cervical: No  cervical adenopathy.  Skin:    General: Skin is warm and dry.     Findings: No rash.  Neurological:     Mental Status: She is alert and oriented to person, place, and time. Mental status is at baseline.  Psychiatric:        Mood and Affect: Mood normal.        Behavior: Behavior normal.       No results found for any visits on 05/24/22.  Assessment & Plan     Problem List Items Addressed This Visit       Cardiovascular and Mediastinum   Essential (primary) hypertension - Primary    Well controlled Continue current medications Recheck metabolic panel F/u in 6 months       Relevant Orders   Basic Metabolic Panel (BMET)     Respiratory   OSA (obstructive sleep apnea)    Reviewed sleep study results with patient from 05/18/2022 She was found to have severe OSA with AHI of 63/h and low O2 sat of 75% She is also noted to have some central apneic events and hypopneas Also noted during her study to have irregular heart rhythm including PVCs and possible second-degree AV block Patient needs CPAP titration study This was ordered today and explained to the patient      Relevant Orders   Ambulatory referral to Sleep Studies   LONG TERM MONITOR (3-14 DAYS)     Other   Irregular heart rhythm    Noted during sleep study Likely related to her severe OSA We will use 14-day Zio patch monitor to evaluate further      Relevant Orders   LONG TERM MONITOR (3-14 DAYS)     Return in about 6 months (around 11/23/2022) for CPE, as scheduled.      I, [name], MD, have reviewed all documentation for this visit. The documentation on 05/24/22 for the exam, diagnosis, procedures, and orders are all accurate and complete.   Bacigalupo, Dionne Bucy, MD, MPH [facility]

## 2022-05-24 ENCOUNTER — Ambulatory Visit (INDEPENDENT_AMBULATORY_CARE_PROVIDER_SITE_OTHER): Payer: Medicare Other | Admitting: Family Medicine

## 2022-05-24 ENCOUNTER — Ambulatory Visit (INDEPENDENT_AMBULATORY_CARE_PROVIDER_SITE_OTHER): Payer: Medicare Other

## 2022-05-24 ENCOUNTER — Encounter: Payer: Self-pay | Admitting: Family Medicine

## 2022-05-24 VITALS — BP 124/81 | HR 76 | Temp 97.7°F | Resp 16 | Wt 216.7 lb

## 2022-05-24 DIAGNOSIS — G4733 Obstructive sleep apnea (adult) (pediatric): Secondary | ICD-10-CM

## 2022-05-24 DIAGNOSIS — I10 Essential (primary) hypertension: Secondary | ICD-10-CM | POA: Diagnosis not present

## 2022-05-24 DIAGNOSIS — I499 Cardiac arrhythmia, unspecified: Secondary | ICD-10-CM

## 2022-05-24 NOTE — Assessment & Plan Note (Signed)
Noted during sleep study Likely related to her severe OSA We will use 14-day Zio patch monitor to evaluate further

## 2022-05-24 NOTE — Assessment & Plan Note (Signed)
Well controlled Continue current medications Recheck metabolic panel F/u in 6 months  

## 2022-05-24 NOTE — Assessment & Plan Note (Signed)
Reviewed sleep study results with patient from 05/18/2022 She was found to have severe OSA with AHI of 63/h and low O2 sat of 75% She is also noted to have some central apneic events and hypopneas Also noted during her study to have irregular heart rhythm including PVCs and possible second-degree AV block Patient needs CPAP titration study This was ordered today and explained to the patient

## 2022-05-25 LAB — BASIC METABOLIC PANEL
BUN/Creatinine Ratio: 24 (ref 12–28)
BUN: 23 mg/dL (ref 8–27)
CO2: 26 mmol/L (ref 20–29)
Calcium: 9.2 mg/dL (ref 8.7–10.3)
Chloride: 104 mmol/L (ref 96–106)
Creatinine, Ser: 0.94 mg/dL (ref 0.57–1.00)
Glucose: 96 mg/dL (ref 70–99)
Potassium: 4.1 mmol/L (ref 3.5–5.2)
Sodium: 143 mmol/L (ref 134–144)
eGFR: 62 mL/min/{1.73_m2} (ref >59)

## 2022-05-27 DIAGNOSIS — G4733 Obstructive sleep apnea (adult) (pediatric): Secondary | ICD-10-CM | POA: Diagnosis not present

## 2022-05-27 DIAGNOSIS — I499 Cardiac arrhythmia, unspecified: Secondary | ICD-10-CM | POA: Diagnosis not present

## 2022-06-11 ENCOUNTER — Telehealth: Payer: Self-pay | Admitting: Family Medicine

## 2022-06-11 ENCOUNTER — Other Ambulatory Visit: Payer: Self-pay | Admitting: Family Medicine

## 2022-06-11 DIAGNOSIS — I10 Essential (primary) hypertension: Secondary | ICD-10-CM

## 2022-06-11 DIAGNOSIS — E7800 Pure hypercholesterolemia, unspecified: Secondary | ICD-10-CM

## 2022-06-11 MED ORDER — ATORVASTATIN CALCIUM 40 MG PO TABS: 40.0000 mg | ORAL_TABLET | ORAL | 1 refills | Status: DC

## 2022-06-11 NOTE — Telephone Encounter (Signed)
[facility] faxed refill request for the following medications:  atorvastatin (LIPITOR) 40 MG tablet   Please advise.

## 2022-06-11 NOTE — Telephone Encounter (Signed)
dc'd 04/22/22 "change in therapy" Dorian Pod CMA Requested Prescriptions  Refused Prescriptions Disp Refills  . amLODipine (NORVASC) 10 MG tablet [Pharmacy Med Name: AMLODIPINE BESYLATE '10MG'$  TABLETS] 90 tablet 0    Sig: TAKE 1 TABLET(10 MG) BY MOUTH DAILY     Cardiovascular: Calcium Channel Blockers 2 Passed - 06/11/2022 10:01 AM      Passed - Last BP in normal range    BP Readings from Last 1 Encounters:  05/24/22 124/81         Passed - Last Heart Rate in normal range    Pulse Readings from Last 1 Encounters:  05/24/22 76         Passed - Valid encounter within last 6 months    Recent Outpatient Visits          2 weeks ago Essential (primary) hypertension   [facility], Dionne Bucy, MD   1 month ago Essential (primary) hypertension   [facility], Dionne Bucy, MD   2 months ago [facility], Dionne Bucy, MD   7 months ago Encounter for [employer] annual wellness exam   [facility], Dionne Bucy, MD   1 year ago Essential (primary) hypertension   [facility] Bacigalupo, Dionne Bucy, MD      Future Appointments            In 3 days Lucilla Lame, MD [place]   In 5 days Wannetta Sender, Governor Rooks, MD Urogynecology at [facility]

## 2022-06-14 ENCOUNTER — Ambulatory Visit: Payer: Medicare Other | Admitting: Gastroenterology

## 2022-06-14 ENCOUNTER — Other Ambulatory Visit: Payer: Self-pay

## 2022-06-14 ENCOUNTER — Encounter: Payer: Self-pay | Admitting: Gastroenterology

## 2022-06-14 ENCOUNTER — Ambulatory Visit: Payer: Medicare Other | Admitting: Obstetrics and Gynecology

## 2022-06-14 VITALS — BP 134/83 | HR 87 | Temp 98.4°F | Ht 62.0 in | Wt 216.4 lb

## 2022-06-14 DIAGNOSIS — Z86010 Personal history of colonic polyps: Secondary | ICD-10-CM

## 2022-06-14 DIAGNOSIS — R194 Change in bowel habit: Secondary | ICD-10-CM

## 2022-06-14 MED ORDER — NA SULFATE-K SULFATE-MG SULF 17.5-3.13-1.6 GM/177ML PO SOLN: 1.0000 | Freq: Once | ORAL | 0 refills | Status: AC

## 2022-06-14 NOTE — Progress Notes (Unsigned)
Primary Care Physician: Virginia Crews, MD  Primary Gastroenterologist:  Dr. Lucilla Lame  Chief Complaint  Patient presents with   Follow-up    HPI: Carrie Wheeler is a 81 y.o. female here after having a colonoscopy with 18 polyps last year.  The patient reports that she does not have any children with her son dying at a young age from alcohol and him not having any children.  The patient also reports that at certain times she has fecal urgency.  She denies any unexplained weight loss fevers chills nausea vomiting black stools or bloody stools.  The patient did have a elevated alkaline phosphatase of 145 with a right upper quadrant ultrasound being normal except for being suspicious for mild hepatic steatosis.  The patient's fractionation of her alkaline phosphatase showed her to have 72% of it being from the liver.  Past Medical History:  Diagnosis Date   Environmental allergies    GERD (gastroesophageal reflux disease)    occasional   Hyperlipidemia    Hypertension     Current Outpatient Medications  Medication Sig Dispense Refill   atorvastatin (LIPITOR) 40 MG tablet Take 1 tablet (40 mg total) by mouth every other day. 45 tablet 1   BIOTIN PO Take 2 tablets by mouth daily. Unsure dose     calcium carbonate (OS-CAL) 600 MG TABS tablet Take 1 tablet by mouth daily.     cetirizine (ZYRTEC) 10 MG tablet Take 1 tablet by mouth daily. As needed     Cholecalciferol (VITAMIN D) 2000 UNITS CAPS Take by mouth daily.      conjugated estrogens (PREMARIN) vaginal cream Place 1 Applicatorful vaginally daily for 28 days, THEN 1 Applicatorful 2 (two) times a week. 30 g 6   fluticasone (FLONASE) 50 MCG/ACT nasal spray SHAKE LIQUID AND USE 2 SPRAYS IN EACH NOSTRIL EVERY DAY 16 g 11   hydrochlorothiazide (HYDRODIURIL) 25 MG tablet Take 1 tablet (25 mg total) by mouth daily. 90 tablet 0   Lactobacillus (PROBIOTIC ACIDOPHILUS PO) Take 1 tablet by mouth daily.     pantoprazole (PROTONIX) 40  MG tablet Take 1 tablet (40 mg total) by mouth daily. 30 tablet 3   No current facility-administered medications for this visit.    Allergies as of 06/14/2022   (No Known Allergies)    ROS:  General: Negative for anorexia, weight loss, fever, chills, fatigue, weakness. ENT: Negative for hoarseness, difficulty swallowing , nasal congestion. CV: Negative for chest pain, angina, palpitations, dyspnea on exertion, peripheral edema.  Respiratory: Negative for dyspnea at rest, dyspnea on exertion, cough, sputum, wheezing.  GI: See history of present illness. GU:  Negative for dysuria, hematuria, urinary incontinence, urinary frequency, nocturnal urination.  Endo: Negative for unusual weight change.    Physical Examination:   BP 134/83   Pulse 87   Temp 98.4 F (36.9 C) (Oral)   Ht '5\' 2"'$  (1.575 m)   Wt 216 lb 6.4 oz (98.2 kg)   BMI 39.58 kg/m   General: Well-nourished, well-developed in no acute distress.  Eyes: No icterus. Conjunctivae pink. Skin: Warm and dry, no jaundice.   Psych: Alert and cooperative, normal mood and affect.  Labs:    Imaging Studies: SLEEP STUDY DOCUMENTS  Result Date: 05/18/2022 Ordered by an unspecified provider.  SLEEP STUDY DOCUMENTS  Result Date: 05/18/2022 Ordered by an unspecified provider.   Assessment and Plan:   Carrie Wheeler is a 81 y.o. y/o female who comes in today with a history  of 18 polyps at her last colonoscopy a year ago.  The patient has no children and will be followed up with a repeat colonoscopy now.  The patient has no children that are living in her son who is deceased did not have any children.  Genetic testing is of little benefit at this time.  The patient will be set up for repeat colonoscopy.  Due to the patient's urgency she has been told to avoid dairy products which she knows to make her stool softer and she has been told to increase fiber in her diet.  The patient has been explained the plan agrees with  it.     Lucilla Lame, MD. Marval Regal    Note: This dictation was prepared with Dragon dictation along with smaller phrase technology. Any transcriptional errors that result from this process are unintentional.

## 2022-06-16 ENCOUNTER — Ambulatory Visit: Payer: Medicare Other | Admitting: Obstetrics and Gynecology

## 2022-06-16 ENCOUNTER — Encounter: Payer: Self-pay | Admitting: Obstetrics and Gynecology

## 2022-06-16 VITALS — BP 130/72 | HR 90

## 2022-06-16 DIAGNOSIS — N393 Stress incontinence (female) (male): Secondary | ICD-10-CM

## 2022-06-16 DIAGNOSIS — N816 Rectocele: Secondary | ICD-10-CM | POA: Diagnosis not present

## 2022-06-16 NOTE — Progress Notes (Signed)
[facility] Return Visit  SUBJECTIVE  History of Present Illness: Carrie Wheeler is a 81 y.o. female seen in follow-up for stage II posterior prolapse and overactive bladder here for a pessary check.   She has a #4 incontinence dish pessary. Denies vaginal bleeding. Pessary has remained in place. Has not been using premarin cream regularly.   Past Medical History: Patient  has a past medical history of Environmental allergies, GERD (gastroesophageal reflux disease), Hyperlipidemia, and Hypertension.   Past Surgical History: She  has a past surgical history that includes Tonsillectomy; Colonoscopy; Colonoscopy with propofol (N/A, 11/27/2015); Colonoscopy with propofol (N/A, 05/18/2021); Esophagogastroduodenoscopy (egd) with propofol (N/A, 05/18/2021); and polypectomy (N/A, 05/18/2021).   Medications: She has a current medication list which includes the following prescription(s): atorvastatin, biotin, calcium carbonate, cetirizine, vitamin d, premarin, fluticasone, hydrochlorothiazide, lactobacillus, and pantoprazole.   Allergies: Patient has No Known Allergies.   Social History: Patient  reports that she has never smoked. She has never used smokeless tobacco. She reports that she does not drink alcohol and does not use drugs.      OBJECTIVE     Physical Exam: Vitals:   06/16/22 0819  BP: 130/72  Pulse: 90   Gen: No apparent distress, A&O x 3.  Detailed Urogynecologic Evaluation:  Pessary was noted to be in place. It was removed, and cleaned. Speculum exam reveals normal mucosa with no lesions. Pessary replaced and felt comfortable.   Prior exam showed:  POP-Q (10/14/21):    POP-Q   -2                                            Aa   -2                                           Ba   -6                                              C    2.5                                            Gh   3.5                                            Pb   9                                             tvl    0                                            Ap   0  Bp   -7                                              D        ASSESSMENT AND PLAN    Ms. Dunleavy is a 80 y.o. with:  1. Prolapse of posterior vaginal wall   2. SUI (stress urinary incontinence, female)     - Pessary cleaned and replaced today - continue premarin cream twice a week - pessary cleanings q 3 months.   Jaquita Folds, MD  Time spent: I spent 20 minutes dedicated to the care of this patient on the date of this encounter to include pre-visit review of records, face-to-face time with the patient and post visit documentation.

## 2022-06-18 DIAGNOSIS — I499 Cardiac arrhythmia, unspecified: Secondary | ICD-10-CM | POA: Diagnosis not present

## 2022-06-18 DIAGNOSIS — I471 Supraventricular tachycardia: Secondary | ICD-10-CM | POA: Diagnosis not present

## 2022-06-18 DIAGNOSIS — G4733 Obstructive sleep apnea (adult) (pediatric): Secondary | ICD-10-CM | POA: Diagnosis not present

## 2022-06-22 NOTE — Progress Notes (Signed)
Review of Zio patch from electrophysiology doctor shows 36 non sustained rhythms, the longest just over 1 minute. Average rate of 103 beats/minute and longest of 6 beats at rate of 187 beats/min. Occasional supraventricular and ventricular ectopy and no identified Atrial fibrillation. No further follow up needed.  Gwyneth Sprout, [address] [place], [place] [address] [phone] (phone) [phone] (fax) [place]

## 2022-06-30 ENCOUNTER — Other Ambulatory Visit: Payer: Self-pay

## 2022-06-30 ENCOUNTER — Telehealth: Payer: Self-pay | Admitting: Gastroenterology

## 2022-06-30 MED ORDER — PANTOPRAZOLE SODIUM 40 MG PO TBEC: 40.0000 mg | DELAYED_RELEASE_TABLET | Freq: Every day | ORAL | 6 refills | Status: DC

## 2022-06-30 NOTE — Telephone Encounter (Signed)
Patient called and stated she needs a refill on pantoprazole and her pharmacy said that Dr Allen Norris has to approve it.

## 2022-07-16 ENCOUNTER — Ambulatory Visit: Payer: Medicare Other | Attending: Neurology

## 2022-07-16 DIAGNOSIS — G4733 Obstructive sleep apnea (adult) (pediatric): Secondary | ICD-10-CM | POA: Diagnosis not present

## 2022-07-17 ENCOUNTER — Other Ambulatory Visit: Payer: Self-pay | Admitting: Family Medicine

## 2022-08-11 ENCOUNTER — Encounter: Payer: Self-pay | Admitting: Gastroenterology

## 2022-08-16 ENCOUNTER — Encounter
Admission: RE | Disposition: A | Discharge status: Home / Self Care | Payer: Self-pay | Source: Home / Self Care | Attending: Gastroenterology

## 2022-08-16 ENCOUNTER — Other Ambulatory Visit: Payer: Self-pay

## 2022-08-16 ENCOUNTER — Ambulatory Visit (AMBULATORY_SURGERY_CENTER): Payer: Medicare Other | Admitting: Anesthesiology

## 2022-08-16 ENCOUNTER — Ambulatory Visit
Admission: RE | Admit: 2022-08-16 | Discharge: 2022-08-16 | Disposition: A | Discharge status: Home / Self Care | Payer: Medicare Other | Attending: Gastroenterology | Admitting: Gastroenterology

## 2022-08-16 ENCOUNTER — Encounter: Payer: Self-pay | Admitting: Gastroenterology

## 2022-08-16 ENCOUNTER — Ambulatory Visit: Payer: Medicare Other | Admitting: Anesthesiology

## 2022-08-16 DIAGNOSIS — K219 Gastro-esophageal reflux disease without esophagitis: Secondary | ICD-10-CM | POA: Diagnosis not present

## 2022-08-16 DIAGNOSIS — K635 Polyp of colon: Secondary | ICD-10-CM | POA: Diagnosis not present

## 2022-08-16 DIAGNOSIS — I10 Essential (primary) hypertension: Secondary | ICD-10-CM | POA: Insufficient documentation

## 2022-08-16 DIAGNOSIS — G4730 Sleep apnea, unspecified: Secondary | ICD-10-CM | POA: Diagnosis not present

## 2022-08-16 DIAGNOSIS — E785 Hyperlipidemia, unspecified: Secondary | ICD-10-CM | POA: Diagnosis not present

## 2022-08-16 DIAGNOSIS — D122 Benign neoplasm of ascending colon: Secondary | ICD-10-CM | POA: Diagnosis not present

## 2022-08-16 DIAGNOSIS — D123 Benign neoplasm of transverse colon: Secondary | ICD-10-CM | POA: Diagnosis not present

## 2022-08-16 DIAGNOSIS — Z79899 Other long term (current) drug therapy: Secondary | ICD-10-CM | POA: Diagnosis not present

## 2022-08-16 DIAGNOSIS — K648 Other hemorrhoids: Secondary | ICD-10-CM | POA: Diagnosis not present

## 2022-08-16 DIAGNOSIS — K640 First degree hemorrhoids: Secondary | ICD-10-CM | POA: Insufficient documentation

## 2022-08-16 DIAGNOSIS — Z86010 Personal history of colonic polyps: Secondary | ICD-10-CM

## 2022-08-16 DIAGNOSIS — Z09 Encounter for follow-up examination after completed treatment for conditions other than malignant neoplasm: Secondary | ICD-10-CM | POA: Insufficient documentation

## 2022-08-16 HISTORY — PX: COLONOSCOPY WITH PROPOFOL: SHX5780

## 2022-08-16 SURGERY — COLONOSCOPY WITH PROPOFOL
Anesthesia: General

## 2022-08-16 MED ORDER — LACTATED RINGERS IV SOLN: INTRAVENOUS | Status: DC

## 2022-08-16 MED ORDER — PROPOFOL 10 MG/ML IV BOLUS
INTRAVENOUS | Status: DC | PRN
  Administered 2022-08-16 (×2): 100 mg via INTRAVENOUS

## 2022-08-16 MED ORDER — STERILE WATER FOR IRRIGATION IR SOLN
Status: DC | PRN
  Administered 2022-08-16: 1000 mL

## 2022-08-16 MED ORDER — SODIUM CHLORIDE 0.9 % IV SOLN: INTRAVENOUS | Status: DC

## 2022-08-16 MED ORDER — FENTANYL CITRATE PF 50 MCG/ML IJ SOSY: 25.0000 ug | PREFILLED_SYRINGE | INTRAMUSCULAR | Status: DC | PRN

## 2022-08-16 MED ORDER — ONDANSETRON HCL 4 MG/2ML IJ SOLN: 4.0000 mg | Freq: Once | INTRAMUSCULAR | Status: DC | PRN

## 2022-08-16 SURGICAL SUPPLY — 21 items

## 2022-08-16 NOTE — H&P (Signed)
Lucilla Lame, MD [address] [place], [place] [address] Phone:[phone] Fax : [phone]  Primary Care Physician:  Virginia Crews, MD Primary Gastroenterologist:  Dr. Allen Norris  Pre-Procedure History & Physical: HPI:  Carrie Wheeler is a 81 y.o. female is here for an colonoscopy.   Past Medical History:  Diagnosis Date   Environmental allergies    GERD (gastroesophageal reflux disease)    occasional   Hyperlipidemia    Hypertension     Past Surgical History:  Procedure Laterality Date   COLONOSCOPY     COLONOSCOPY WITH PROPOFOL N/A 11/27/2015   Procedure: COLONOSCOPY WITH PROPOFOL;  Surgeon: Lucilla Lame, MD;  Location: [facility];  Service: Endoscopy;  Laterality: N/A;   COLONOSCOPY WITH PROPOFOL N/A 05/18/2021   Procedure: COLONOSCOPY WITH PROPOFOL;  Surgeon: Lucilla Lame, MD;  Location: [facility];  Service: Endoscopy;  Laterality: N/A;   ESOPHAGOGASTRODUODENOSCOPY (EGD) WITH PROPOFOL N/A 05/18/2021   Procedure: ESOPHAGOGASTRODUODENOSCOPY (EGD) WITH PROPOFOL;  Surgeon: Lucilla Lame, MD;  Location: [facility];  Service: Endoscopy;  Laterality: N/A;   POLYPECTOMY N/A 05/18/2021   Procedure: POLYPECTOMY;  Surgeon: Lucilla Lame, MD;  Location: [facility];  Service: Endoscopy;  Laterality: N/A;   TONSILLECTOMY      Prior to Admission medications   Medication Sig Start Date End Date Taking? Authorizing Provider  atorvastatin (LIPITOR) 40 MG tablet Take 1 tablet (40 mg total) by mouth every other day. 06/11/22  Yes Bacigalupo, Dionne Bucy, MD  calcium carbonate (OS-CAL) 600 MG TABS tablet Take 1 tablet by mouth daily.   Yes [provider]  cetirizine (ZYRTEC) 10 MG tablet Take 1 tablet by mouth daily. As needed 01/02/10  Yes [provider]  Cholecalciferol (VITAMIN D) 2000 UNITS CAPS Take by mouth daily.    Yes [provider]  conjugated estrogens (PREMARIN) vaginal cream Place 1 Applicatorful  vaginally daily for 28 days, THEN 1 Applicatorful 2 (two) times a week. 01/14/22 02/06/23 Yes Schuman, Christanna R, MD  fluticasone (FLONASE) 50 MCG/ACT nasal spray SHAKE LIQUID AND USE 2 SPRAYS IN EACH NOSTRIL EVERY DAY 07/27/21  Yes Bacigalupo, Dionne Bucy, MD  hydrochlorothiazide (HYDRODIURIL) 25 MG tablet TAKE 1 TABLET(25 MG) BY MOUTH DAILY 07/19/22  Yes Bacigalupo, Dionne Bucy, MD  Lactobacillus (PROBIOTIC ACIDOPHILUS PO) Take 1 tablet by mouth daily.   Yes [provider]  pantoprazole (PROTONIX) 40 MG tablet Take 1 tablet (40 mg total) by mouth daily. 06/30/22  Yes Lucilla Lame, MD  BIOTIN PO Take 2 tablets by mouth daily. Unsure dose Patient not taking: Reported on 08/11/2022    [provider]    Allergies as of 06/14/2022   (No Known Allergies)    Family History  Problem Relation Age of Onset   Diabetes Mother    Hypertension Father    Colon cancer Father    Heart Problems Brother        Congential heart defect   Healthy Brother    Liver disease Son    Alzheimer's disease Other    Diabetes Other    Colon cancer Other    Breast cancer Neg Hx     Social History   Socioeconomic History   Marital status: Widowed    Spouse name: Not on file   Number of children: 0   Years of education: HS   Highest education level: High school graduate  Occupational History    Employer: [employer]    Comment: part-time  Tobacco Use  Smoking status: Never   Smokeless tobacco: Never  Vaping Use   Vaping Use: Never used  Substance and Sexual Activity   Alcohol use: Never    Comment: 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   Social Determinants of Health   Financial Resource Strain: Low Risk  (10/14/2020)   Overall Financial Resource Strain (CARDIA)    Difficulty of Paying Living  Expenses: Not hard at all  Food Insecurity: No Food Insecurity (10/14/2020)   Hunger Vital Sign    Worried About Running Out of Food in the Last Year: Never true    Ran Out of Food in the Last Year: Never true  Transportation Needs: No Transportation Needs (10/14/2020)   PRAPARE - Hydrologist (Medical): No    Lack of Transportation (Non-Medical): No  Physical Activity: Inactive (10/14/2020)   Exercise Vital Sign    Days of Exercise per Week: 0 days    Minutes of Exercise per Session: 0 min  Stress: No Stress Concern Present (10/14/2020)   [place]    Feeling of Stress : Not at all  Social Connections: Moderately Isolated (10/14/2020)   Social Connection and Isolation Panel [NHANES]    Frequency of Communication with Friends and Family: More than three times a week    Frequency of Social Gatherings with Friends and Family: More than three times a week    Attends Religious Services: More than 4 times per year    Active Member of [employer] or Organizations: No    Attends Archivist Meetings: Never    Marital Status: Widowed  Intimate Partner Violence: Not At Risk (10/14/2020)   Humiliation, Afraid, Rape, and Kick questionnaire    Fear of Current or Ex-Partner: No    Emotionally Abused: No    Physically Abused: No    Sexually Abused: No    Review of Systems: See HPI, otherwise negative ROS  Physical Exam: BP (!) 152/87   Pulse 73   Temp (!) 97 F (36.1 C) (Temporal)   Ht '5\' 2"'$  (1.575 m)   Wt 95.7 kg   SpO2 93%   BMI 38.59 kg/m  General:   Alert,  pleasant and cooperative in NAD Head:  Normocephalic and atraumatic. Neck:  Supple; no masses or thyromegaly. Lungs:  Clear throughout to auscultation.    Heart:  Regular rate and rhythm. Abdomen:  Soft, nontender and nondistended. Normal bowel sounds, without guarding, and without rebound.   Neurologic:  Alert and  oriented x4;   grossly normal neurologically.  Impression/Plan: Carrie Wheeler is here for an colonoscopy to be performed for a history of adenomatous polyps on 2022   Risks, benefits, limitations, and alternatives regarding  colonoscopy have been reviewed with the patient.  Questions have been answered.  All parties agreeable.   Lucilla Lame, MD  08/16/2022, 8:12 AM

## 2022-08-16 NOTE — Op Note (Signed)
[facility] Patient Name: Carrie Wheeler Procedure Date: 08/16/2022 8:12 AM MRN: [id number] Account #: [id number] Date of Birth: 1941/11/20 Admit Type: Outpatient Age: 81 Room: [facility] OR ROOM 01 Gender: Female Note Status: Finalized Instrument Name: [id number] Procedure:             Colonoscopy Indications:           High risk colon cancer surveillance: Personal history                         of colonic polyps Providers:             Lucilla Lame MD, MD Referring MD:          Dionne Bucy. Bacigalupo (Referring MD) Medicines:             Propofol per Anesthesia Complications:         No immediate complications. Procedure:             Pre-Anesthesia Assessment:                        - Prior to the procedure, a History and Physical was                         performed, and patient medications and allergies were                         reviewed. The patient's tolerance of previous                         anesthesia was also reviewed. The risks and benefits                         of the procedure and the sedation options and risks                         were discussed with the patient. All questions were                         answered, and informed consent was obtained. Prior                         Anticoagulants: The patient has taken no previous                         anticoagulant or antiplatelet agents. ASA Grade                         Assessment: II - A patient with mild systemic disease.                         After reviewing the risks and benefits, the patient                         was deemed in satisfactory condition to undergo the                         procedure.  After obtaining informed consent, the colonoscope was                         passed under direct vision. Throughout the procedure,                         the patient's blood pressure, pulse, and oxygen                         saturations were monitored  continuously. The                         Colonoscope was introduced through the anus and                         advanced to the the cecum, identified by appendiceal                         orifice and ileocecal valve. The colonoscopy was                         performed without difficulty. The patient tolerated                         the procedure well. The quality of the bowel                         preparation was excellent. Findings:      The perianal and digital rectal examinations were normal.      A 2 mm polyp was found in the ascending colon. The polyp was sessile.       The polyp was removed with a cold biopsy forceps. Resection and       retrieval were complete.      A 5 mm polyp was found in the ascending colon. The polyp was sessile.       The polyp was removed with a cold snare. Resection and retrieval were       complete.      Two sessile polyps were found in the transverse colon. The polyps were 4       to 5 mm in size. These polyps were removed with a cold snare. Resection       and retrieval were complete.      Non-bleeding internal hemorrhoids were found during retroflexion. The       hemorrhoids were Grade I (internal hemorrhoids that do not prolapse). Impression:            - One 2 mm polyp in the ascending colon, removed with                         a cold biopsy forceps. Resected and retrieved.                        - One 5 mm polyp in the ascending colon, removed with                         a cold snare. Resected and retrieved.                        -  Two 4 to 5 mm polyps in the transverse colon,                         removed with a cold snare. Resected and retrieved.                        - Non-bleeding internal hemorrhoids. Recommendation:        - Discharge patient to home.                        - Resume previous diet.                        - Continue present medications.                        - Await pathology results. Procedure Code(s):     ---  Professional ---                        [phone], Colonoscopy, flexible; with removal of                         tumor(s), polyp(s), or other lesion(s) by snare                         technique                        [id number], 65, Colonoscopy, flexible; with biopsy, single                         or multiple Diagnosis Code(s):     --- Professional ---                        Z86.010, Personal history of colonic polyps                        K63.5, Polyp of colon CPT copyright 2019 American Medical Association. All rights reserved. The codes documented in this report are preliminary and upon coder review may  be revised to meet current compliance requirements. Lucilla Lame MD, MD 08/16/2022 8:36:49 AM This report has been signed electronically. Number of Addenda: 0 Note Initiated On: 08/16/2022 8:12 AM Scope Withdrawal Time: 0 hours 11 minutes 53 seconds  Total Procedure Duration: 0 hours 15 minutes 39 seconds  Estimated Blood Loss:  Estimated blood loss: none.      [facility]

## 2022-08-16 NOTE — Anesthesia Postprocedure Evaluation (Signed)
Anesthesia Post Note  Patient: Carrie Wheeler  Procedure(s) Performed: COLONOSCOPY/POLYPECTOMY  WITH PROPOFOL     Patient location during evaluation: PACU Anesthesia Type: General Level of consciousness: awake and alert Pain management: pain level controlled Vital Signs Assessment: post-procedure vital signs reviewed and stable Respiratory status: spontaneous breathing, nonlabored ventilation, respiratory function stable and patient connected to nasal cannula oxygen Cardiovascular status: blood pressure returned to baseline and stable Postop Assessment: no apparent nausea or vomiting Anesthetic complications: no   No notable events documented.  Molli Barrows

## 2022-08-16 NOTE — Transfer of Care (Signed)
Immediate Anesthesia Transfer of Care Note  Patient: Carrie Wheeler  Procedure(s) Performed: COLONOSCOPY/POLYPECTOMY  WITH PROPOFOL  Patient Location: PACU  Anesthesia Type: General  Level of Consciousness: awake, alert  and patient cooperative  Airway and Oxygen Therapy: Patient Spontanous Breathing and Patient connected to supplemental oxygen  Post-op Assessment: Post-op Vital signs reviewed, Patient's Cardiovascular Status Stable, Respiratory Function Stable, Patent Airway and No signs of Nausea or vomiting  Post-op Vital Signs: Reviewed and stable  Complications: No notable events documented.

## 2022-08-16 NOTE — Anesthesia Preprocedure Evaluation (Signed)
Anesthesia Evaluation  Patient identified by MRN, date of birth, ID band Patient awake    Reviewed: Allergy & Precautions, H&P , NPO status , Patient's Chart, lab work & pertinent test results, reviewed documented beta blocker date and time   Airway Mallampati: II   Neck ROM: full    Dental  (+) Poor Dentition   Pulmonary sleep apnea ,    Pulmonary exam normal        Cardiovascular Exercise Tolerance: Poor hypertension, On Medications Normal cardiovascular exam+ dysrhythmias  Rhythm:regular Rate:Normal     Neuro/Psych negative neurological ROS  negative psych ROS   GI/Hepatic Neg liver ROS, GERD  Medicated,  Endo/Other  negative endocrine ROS  Renal/GU negative Renal ROS  negative genitourinary   Musculoskeletal   Abdominal   Peds  Hematology negative hematology ROS (+)   Anesthesia Other Findings Past Medical History: No date: Environmental allergies No date: GERD (gastroesophageal reflux disease)     Comment:  occasional No date: Hyperlipidemia No date: Hypertension Past Surgical History: No date: COLONOSCOPY 11/27/2015: COLONOSCOPY WITH PROPOFOL; N/A     Comment:  Procedure: COLONOSCOPY WITH PROPOFOL;  Surgeon: Lucilla Lame, MD;  Location: [place];  Service:               Endoscopy;  Laterality: N/A; 05/18/2021: COLONOSCOPY WITH PROPOFOL; N/A     Comment:  Procedure: COLONOSCOPY WITH PROPOFOL;  Surgeon: Lucilla Lame, MD;  Location: [place];  Service:               Endoscopy;  Laterality: N/A; 05/18/2021: ESOPHAGOGASTRODUODENOSCOPY (EGD) WITH PROPOFOL; N/A     Comment:  Procedure: ESOPHAGOGASTRODUODENOSCOPY (EGD) WITH               PROPOFOL;  Surgeon: Lucilla Lame, MD;  Location: [place];  Service: Endoscopy;  Laterality: N/A; 05/18/2021: POLYPECTOMY; N/A     Comment:  Procedure: POLYPECTOMY;  Surgeon: Lucilla Lame, MD;                 Location: [place];  Service: Endoscopy;                Laterality: N/A; No date: TONSILLECTOMY BMI    Body Mass Index: 38.59 kg/m     Reproductive/Obstetrics negative OB ROS                             Anesthesia Physical Anesthesia Plan  ASA: 3  Anesthesia Plan: General   Post-op Pain Management:    Induction:   PONV Risk Score and Plan:   Airway Management Planned:   Additional Equipment:   Intra-op Plan:   Post-operative Plan:   Informed Consent: I have reviewed the patients History and Physical, chart, labs and discussed the procedure including the risks, benefits and alternatives for the proposed anesthesia with the patient or authorized representative who has indicated his/her understanding and acceptance.     Dental Advisory Given  Plan Discussed with: CRNA  Anesthesia Plan Comments:         Anesthesia Quick Evaluation

## 2022-08-17 ENCOUNTER — Encounter: Payer: Self-pay | Admitting: Gastroenterology

## 2022-08-18 LAB — SURGICAL PATHOLOGY

## 2022-08-19 ENCOUNTER — Encounter: Payer: Self-pay | Admitting: Gastroenterology

## 2022-09-17 ENCOUNTER — Encounter: Payer: Self-pay | Admitting: Obstetrics and Gynecology

## 2022-09-17 ENCOUNTER — Ambulatory Visit (INDEPENDENT_AMBULATORY_CARE_PROVIDER_SITE_OTHER): Payer: Medicare Other | Admitting: Obstetrics and Gynecology

## 2022-09-17 VITALS — BP 162/78 | HR 54

## 2022-09-17 DIAGNOSIS — N393 Stress incontinence (female) (male): Secondary | ICD-10-CM

## 2022-09-17 DIAGNOSIS — N816 Rectocele: Secondary | ICD-10-CM | POA: Diagnosis not present

## 2022-09-17 NOTE — Progress Notes (Signed)
[facility] Return Visit  SUBJECTIVE  History of Present Illness: Carrie Wheeler is a 81 y.o. female seen in follow-up for stage II posterior prolapse and incontinence here for a pessary check.   She has a #4 incontinence dish pessary. Denies vaginal bleeding. It is working well for her. She is using premarin cream twice a week.   Past Medical History: Patient  has a past medical history of Environmental allergies, GERD (gastroesophageal reflux disease), Hyperlipidemia, and Hypertension.   Past Surgical History: She  has a past surgical history that includes Tonsillectomy; Colonoscopy; Colonoscopy with propofol (N/A, 11/27/2015); Colonoscopy with propofol (N/A, 05/18/2021); Esophagogastroduodenoscopy (egd) with propofol (N/A, 05/18/2021); polypectomy (N/A, 05/18/2021); and Colonoscopy with propofol (N/A, 08/16/2022).   Medications: She has a current medication list which includes the following prescription(s): atorvastatin, calcium carbonate, cetirizine, vitamin d, premarin, fluticasone, hydrochlorothiazide, lactobacillus, pantoprazole, and biotin.   Allergies: Patient has No Known Allergies.   Social History: Patient  reports that she has never smoked. She has never used smokeless tobacco. She reports that she does not drink alcohol and does not use drugs.      OBJECTIVE     Physical Exam: Vitals:   09/17/22 0828  BP: (!) 162/78  Pulse: (!) 54    Gen: No apparent distress, A&O x 3.  Detailed Urogynecologic Evaluation:  Pessary was noted to be in place. It was removed, and cleaned. Speculum exam reveals normal mucosa with no lesions. Pessary replaced and felt comfortable.   Prior exam showed:  POP-Q (10/14/21):    POP-Q   -2                                            Aa   -2                                           Ba   -6                                              C    2.5                                            Gh   3.5                                             Pb   9                                            tvl    0                                            Ap  0                                            Bp   -7                                              D        ASSESSMENT AND PLAN    Carrie Wheeler is a 81 y.o. with:  1. Prolapse of posterior vaginal wall   2. SUI (stress urinary incontinence, female)     - Pessary cleaned and replaced today - continue premarin cream twice a week - pessary cleanings q 3 months.   Jaquita Folds, MD  Time spent: I spent 20 minutes dedicated to the care of this patient on the date of this encounter to include pre-visit review of records, face-to-face time with the patient and post visit documentation.

## 2022-10-02 ENCOUNTER — Other Ambulatory Visit: Payer: Self-pay | Admitting: Family Medicine

## 2022-10-04 NOTE — Telephone Encounter (Signed)
Requested Prescriptions  Pending Prescriptions Disp Refills  . hydrochlorothiazide (HYDRODIURIL) 25 MG tablet [Pharmacy Med Name: HYDROCHLOROTHIAZIDE '25MG'$  TABLETS] 90 tablet 0    Sig: TAKE 1 TABLET(25 MG) BY MOUTH DAILY     Cardiovascular: Diuretics - Thiazide Failed - 10/02/2022 11:02 AM      Failed - Last BP in normal range    BP Readings from Last 1 Encounters:  09/17/22 (!) 162/78         Passed - Cr in normal range and within 180 days    Creatinine  Date Value Ref Range Status  03/02/2015 0.67 mg/dL Final    Comment:    0.44-1.00 NOTE: New Reference Range  02/11/15    Creatinine, Ser  Date Value Ref Range Status  05/24/2022 0.94 0.57 - 1.00 mg/dL Final         Passed - K in normal range and within 180 days    Potassium  Date Value Ref Range Status  05/24/2022 4.1 3.5 - 5.2 mmol/L Final  03/02/2015 4.3 mmol/L Final    Comment:    3.5-5.1 NOTE: New Reference Range  02/11/15          Passed - Na in normal range and within 180 days    Sodium  Date Value Ref Range Status  05/24/2022 143 134 - 144 mmol/L Final  03/02/2015 139 mmol/L Final    Comment:    135-145 NOTE: New Reference Range  02/11/15          Passed - Valid encounter within last 6 months    Recent Outpatient Visits          4 months ago Essential (primary) hypertension   [employer], Dionne Bucy, MD   5 months ago Essential (primary) hypertension   [employer], Dionne Bucy, MD   6 months ago [place], Dionne Bucy, MD   11 months ago Encounter for [employer] annual wellness exam   [employer], Dionne Bucy, MD   1 year ago Essential (primary) hypertension   [facility]igal[facility], Dionne Bucy, MD      Future Appointments            In 2 months Wannetta Sender, Governor Rooks, MD Urogynecology at [facility], [facility]   In 5 months Wannetta Sender, Governor Rooks, MD  Urogynecology at [facility], [facility]

## 2022-11-09 ENCOUNTER — Ambulatory Visit (INDEPENDENT_AMBULATORY_CARE_PROVIDER_SITE_OTHER): Payer: Medicare Other | Admitting: Family Medicine

## 2022-11-09 ENCOUNTER — Encounter: Payer: Self-pay | Admitting: Family Medicine

## 2022-11-09 VITALS — BP 135/78 | HR 65 | Temp 97.8°F | Resp 16 | Ht <= 58 in | Wt 219.8 lb

## 2022-11-09 DIAGNOSIS — I10 Essential (primary) hypertension: Secondary | ICD-10-CM | POA: Diagnosis not present

## 2022-11-09 DIAGNOSIS — E559 Vitamin D deficiency, unspecified: Secondary | ICD-10-CM

## 2022-11-09 DIAGNOSIS — E7800 Pure hypercholesterolemia, unspecified: Secondary | ICD-10-CM

## 2022-11-09 DIAGNOSIS — Z23 Encounter for immunization: Secondary | ICD-10-CM | POA: Diagnosis not present

## 2022-11-09 DIAGNOSIS — R7303 Prediabetes: Secondary | ICD-10-CM

## 2022-11-09 DIAGNOSIS — Z0000 Encounter for general adult medical examination without abnormal findings: Secondary | ICD-10-CM | POA: Diagnosis not present

## 2022-11-09 NOTE — Assessment & Plan Note (Signed)
Previously well controlled Continue statin Repeat FLP and CMP  

## 2022-11-09 NOTE — Assessment & Plan Note (Signed)
Well controlled Continue current medications Recheck metabolic panel F/u in 6 months  

## 2022-11-09 NOTE — Assessment & Plan Note (Signed)
Recommend low carb diet °Recheck A1c  °

## 2022-11-09 NOTE — Progress Notes (Signed)
I,[name],acting as a Education administrator for [name], MD.,have documented all relevant documentation on the behalf of [name], MD,as directed by  [name], MD while in the presence of [name], MD.    Annual Wellness Visit     Patient: Carrie Wheeler, Female    DOB: 11/07/1941, 81 y.o.   MRN: [id number] Visit Date: 11/09/2022  Today's Provider: [name], MD   Chief Complaint  Patient presents with   Medicare Wellness   Subjective    Carrie Wheeler is a 81 y.o. female who presents today for her Annual Wellness Visit. She reports consuming a general diet. Home exercise routine includes stretching. She generally feels well. She reports sleeping well. She does not have additional problems to discuss today.   HPI Would like to have q other year mammos - declines today   Medications: Outpatient Medications Prior to Visit  Medication Sig   atorvastatin (LIPITOR) 40 MG tablet Take 1 tablet (40 mg total) by mouth every other day.   calcium carbonate (OS-CAL) 600 MG TABS tablet Take 1 tablet by mouth daily.   cetirizine (ZYRTEC) 10 MG tablet Take 1 tablet by mouth daily. As needed   Cholecalciferol (VITAMIN D) 2000 UNITS CAPS Take by mouth daily.    conjugated estrogens (PREMARIN) vaginal cream Place 1 Applicatorful vaginally daily for 28 days, THEN 1 Applicatorful 2 (two) times a week.   fluticasone (FLONASE) 50 MCG/ACT nasal spray SHAKE LIQUID AND USE 2 SPRAYS IN EACH NOSTRIL EVERY DAY   hydrochlorothiazide (HYDRODIURIL) 25 MG tablet TAKE 1 TABLET(25 MG) BY MOUTH DAILY   Lactobacillus (PROBIOTIC ACIDOPHILUS PO) Take 1 tablet by mouth daily.   pantoprazole (PROTONIX) 40 MG tablet Take 1 tablet (40 mg total) by mouth daily.   [DISCONTINUED] BIOTIN PO Take 2 tablets by mouth daily. Unsure dose (Patient not taking: Reported on 11/09/2022)   No facility-administered medications prior to visit.    No Known Allergies  Patient Care  Team: Virginia Crews, MD as PCP - General (Family Medicine) Judee Clara, DO as Referring Physician (Chiropractic Medicine) Estill Cotta, MD (Ophthalmology)  Review of Systems  Genitourinary:  Positive for enuresis.  All other systems reviewed and are negative.   Last CBC Lab Results  Component Value Date   WBC 8.2 10/05/2018   HGB 12.9 10/05/2018   HCT 38.0 10/05/2018   MCV 89 10/05/2018   MCH 30.2 10/05/2018   RDW 13.4 10/05/2018   PLT 256 94/58/5929   Last metabolic panel Lab Results  Component Value Date   GLUCOSE 96 05/24/2022   NA 143 05/24/2022   K 4.1 05/24/2022   CL 104 05/24/2022   CO2 26 05/24/2022   BUN 23 05/24/2022   CREATININE 0.94 05/24/2022   EGFR 62 05/24/2022   CALCIUM 9.2 05/24/2022   PROT 6.8 04/20/2022   ALBUMIN 4.4 04/20/2022   LABGLOB 2.4 04/20/2022   AGRATIO 1.8 04/20/2022   BILITOT 0.6 04/20/2022   ALKPHOS 145 (H) 04/20/2022   ALKPHOS 149 (H) 04/20/2022   AST 14 04/20/2022   ALT 13 04/20/2022   ANIONGAP 6 11/17/2015   Last lipids Lab Results  Component Value Date   CHOL 166 04/20/2022   HDL 56 04/20/2022   LDLCALC 97 04/20/2022   TRIG 70 04/20/2022   CHOLHDL 2.9 10/20/2021   Last hemoglobin A1c Lab Results  Component Value Date   HGBA1C 5.7 (H) 04/20/2022   Last thyroid functions Lab Results  Component Value Date  TSH 2.590 08/30/2016   Last vitamin D Lab Results  Component Value Date   VD25OH 35.6 10/14/2020   Last vitamin B12 and Folate Lab Results  Component Value Date   VITAMINB12 398 04/10/2020        Objective    Vitals: BP 135/78 (BP Location: Left Arm, Patient Position: Sitting, Cuff Size: Large)   Pulse 65   Temp 97.8 F (36.6 C) (Oral)   Resp 16   Ht 4' 10" (1.473 m)   Wt 219 lb 12.8 oz (99.7 kg)   BMI 45.94 kg/m  BP Readings from Last 3 Encounters:  11/09/22 135/78  09/17/22 (!) 162/78  08/16/22 114/66   Wt Readings from Last 3 Encounters:  11/09/22 219 lb 12.8 oz (99.7  kg)  08/16/22 211 lb (95.7 kg)  06/14/22 216 lb 6.4 oz (98.2 kg)       Physical Exam Vitals reviewed.  Constitutional:      General: She is not in acute distress.    Appearance: Normal appearance. She is well-developed. She is not diaphoretic.  HENT:     Head: Normocephalic and atraumatic.     Right Ear: Tympanic membrane, ear canal and external ear normal.     Left Ear: Tympanic membrane, ear canal and external ear normal.     Nose: Nose normal.     Mouth/Throat:     Mouth: Mucous membranes are moist.     Pharynx: Oropharynx is clear. No oropharyngeal exudate.  Eyes:     General: No scleral icterus.    Conjunctiva/sclera: Conjunctivae normal.     Pupils: Pupils are equal, round, and reactive to light.  Neck:     Thyroid: No thyromegaly.  Cardiovascular:     Rate and Rhythm: Normal rate and regular rhythm.     Pulses: Normal pulses.     Heart sounds: Normal heart sounds. No murmur heard. Pulmonary:     Effort: Pulmonary effort is normal. No respiratory distress.     Breath sounds: Normal breath sounds. No wheezing or rales.  Abdominal:     General: There is no distension.     Palpations: Abdomen is soft.     Tenderness: There is no abdominal tenderness.  Musculoskeletal:        General: No deformity.     Cervical back: Neck supple.     Right lower leg: No edema.     Left lower leg: No edema.  Lymphadenopathy:     Cervical: No cervical adenopathy.  Skin:    General: Skin is warm and dry.     Findings: No rash.  Neurological:     Mental Status: She is alert and oriented to person, place, and time. Mental status is at baseline.     Gait: Gait abnormal (antalgic).  Psychiatric:        Mood and Affect: Mood normal.        Behavior: Behavior normal.        Thought Content: Thought content normal.     Most recent functional status assessment:    11/09/2022    8:30 AM  In your present state of health, do you have any difficulty performing the following activities:   Hearing? 0  Vision? 0  Difficulty concentrating or making decisions? 0  Walking or climbing stairs? 1  Dressing or bathing? 0  Doing errands, shopping? 0   Most recent fall risk assessment:    11/09/2022    8:29 AM  Fall Risk   Falls in the  past year? 1  Number falls in past yr: 0  Injury with Fall? 0  Risk for fall due to : No Fall Risks  Follow up Falls evaluation completed    Most recent depression screenings:    11/09/2022    8:30 AM 05/24/2022    8:16 AM  PHQ 2/9 Scores  PHQ - 2 Score 0 0  PHQ- 9 Score 0 1   Most recent cognitive screening:    11/09/2022    8:31 AM  6CIT Screen  What Year? 0 points  What month? 0 points  What time? 0 points  Count back from 20 0 points  Months in reverse 0 points  Repeat phrase 0 points  Total Score 0 points   Most recent Audit-C alcohol use screening    11/09/2022    8:30 AM  Alcohol Use Disorder Test (AUDIT)  1. How often do you have a drink containing alcohol? 0  2. How many drinks containing alcohol do you have on a typical day when you are drinking? 0  3. How often do you have six or more drinks on one occasion? 0  AUDIT-C Score 0   A score of 3 or more in women, and 4 or more in men indicates increased risk for alcohol abuse, EXCEPT if all of the points are from question 1   No results found for any visits on 11/09/22.  Assessment & Plan     Annual wellness visit done today including the all of the following: Reviewed patient's Family Medical History Reviewed and updated list of patient's medical providers Assessment of cognitive impairment was done Assessed patient's functional ability Established a written schedule for health screening [place] Completed and Reviewed  Exercise Activities and Dietary recommendations  Goals      Weight (lb) < 175 lb (79.4 kg)     Recommend to continue current diet plans of eating healthier options and avoiding heavy carbohydrates, sodium and sugar.         Immunization History  Administered Date(s) Administered   Fluad Quad(high Dose 65+) 10/20/2021   Influenza, High Dose Seasonal PF 09/12/2017, 09/18/2018, 10/03/2019, 09/16/2020   PFIZER(Purple Top)SARS-COV-2 Vaccination 01/30/2020, 02/20/2020, 10/14/2020   Pneumococcal Conjugate-13 05/19/2015   Pneumococcal Polysaccharide-23 04/02/2011   Td 06/01/2006, 08/30/2016   Tdap 06/01/2006    Health Maintenance  Topic Date Due   Zoster Vaccines- Shingrix (1 of 2) Never done   INFLUENZA VACCINE  07/06/2022   COVID-19 Vaccine (4 - 2023-24 season) 08/06/2022   MAMMOGRAM  10/06/2023   Medicare Annual Wellness (AWV)  11/10/2023   DTaP/Tdap/Td (4 - Td or Tdap) 08/30/2026   COLONOSCOPY (Pts 45-22yr Insurance coverage will need to be confirmed)  08/17/2027   Pneumonia Vaccine 81 Years old  Completed   DEXA SCAN  Completed   HPV VACCINES  Aged Out     Discussed health benefits of physical activity, and encouraged her to engage in regular exercise appropriate for her age and condition.    Problem List Items Addressed This Visit       Cardiovascular and Mediastinum   Essential (primary) hypertension    Well controlled Continue current medications Recheck metabolic panel F/u in 6 months       Relevant Orders   Comprehensive metabolic panel     Other   Hyperlipidemia    Previously well controlled Continue statin Repeat FLP and CMP      Relevant Orders   Lipid Panel With LDL/HDL Ratio  Avitaminosis D    Continue supplement Recheck level       Relevant Orders   VITAMIN D 25 Hydroxy (Vit-D Deficiency, Fractures)   Prediabetes    Recommend low carb diet Recheck A1c       Relevant Orders   Hemoglobin A1c   Other Visit Diagnoses     Encounter for Medicare annual wellness exam    -  Primary   Relevant Orders   Comprehensive metabolic panel   Lipid Panel With LDL/HDL Ratio   VITAMIN D 25 Hydroxy (Vit-D Deficiency, Fractures)   Hemoglobin A1c   Flu Vaccine  QUAD High Dose(Fluad)   Encounter for annual physical exam       Needs flu shot       Relevant Orders   Flu Vaccine QUAD High Dose(Fluad)        Return in about 6 months (around 05/11/2023) for chronic disease f/u.     I, [name], MD, have reviewed all documentation for this visit. The documentation on 11/09/22 for the exam, diagnosis, procedures, and orders are all accurate and complete.   Mireyah Chervenak, Dionne Bucy, MD, MPH [facility]

## 2022-11-09 NOTE — Assessment & Plan Note (Signed)
Continue supplement Recheck level 

## 2022-11-10 LAB — LIPID PANEL WITH LDL/HDL RATIO
Cholesterol, Total: 173 mg/dL (ref 100–199)
HDL: 58 mg/dL (ref >39)
LDL Chol Calc (NIH): 98 mg/dL (ref 0–99)
LDL/HDL Ratio: 1.7 ratio (ref 0.0–3.2)
Triglycerides: 93 mg/dL (ref 0–149)
VLDL Cholesterol Cal: 17 mg/dL (ref 5–40)

## 2022-11-10 LAB — COMPREHENSIVE METABOLIC PANEL
ALT: 15 IU/L (ref 0–32)
AST: 17 IU/L (ref 0–40)
Albumin/Globulin Ratio: 1.6 (ref 1.2–2.2)
Albumin: 4.2 g/dL (ref 3.8–4.8)
Alkaline Phosphatase: 134 IU/L — ABNORMAL HIGH (ref 44–121)
BUN/Creatinine Ratio: 27 (ref 12–28)
BUN: 27 mg/dL (ref 8–27)
Bilirubin Total: 0.6 mg/dL (ref 0.0–1.2)
CO2: 27 mmol/L (ref 20–29)
Calcium: 9.4 mg/dL (ref 8.7–10.3)
Chloride: 101 mmol/L (ref 96–106)
Creatinine, Ser: 1 mg/dL (ref 0.57–1.00)
Globulin, Total: 2.7 g/dL (ref 1.5–4.5)
Glucose: 105 mg/dL — ABNORMAL HIGH (ref 70–99)
Potassium: 4.7 mmol/L (ref 3.5–5.2)
Sodium: 143 mmol/L (ref 134–144)
Total Protein: 6.9 g/dL (ref 6.0–8.5)
eGFR: 58 mL/min/{1.73_m2} — ABNORMAL LOW (ref >59)

## 2022-11-10 LAB — HEMOGLOBIN A1C
Est. average glucose Bld gHb Est-mCnc: 123 mg/dL
Hgb A1c MFr Bld: 5.9 % — ABNORMAL HIGH (ref 4.8–5.6)

## 2022-11-10 LAB — VITAMIN D 25 HYDROXY (VIT D DEFICIENCY, FRACTURES): Vit D, 25-Hydroxy: 25.2 ng/mL — ABNORMAL LOW (ref 30.0–100.0)

## 2022-12-08 ENCOUNTER — Other Ambulatory Visit: Payer: Self-pay | Admitting: Family Medicine

## 2022-12-08 DIAGNOSIS — E7800 Pure hypercholesterolemia, unspecified: Secondary | ICD-10-CM

## 2022-12-17 ENCOUNTER — Encounter: Payer: Self-pay | Admitting: Obstetrics and Gynecology

## 2022-12-17 ENCOUNTER — Ambulatory Visit (INDEPENDENT_AMBULATORY_CARE_PROVIDER_SITE_OTHER): Payer: Medicare Other | Admitting: Obstetrics and Gynecology

## 2022-12-17 ENCOUNTER — Ambulatory Visit: Payer: Medicare Other | Admitting: Obstetrics and Gynecology

## 2022-12-17 VITALS — BP 166/73 | HR 75

## 2022-12-17 DIAGNOSIS — Z4689 Encounter for fitting and adjustment of other specified devices: Secondary | ICD-10-CM | POA: Diagnosis not present

## 2022-12-17 DIAGNOSIS — N393 Stress incontinence (female) (male): Secondary | ICD-10-CM

## 2022-12-17 DIAGNOSIS — N952 Postmenopausal atrophic vaginitis: Secondary | ICD-10-CM | POA: Diagnosis not present

## 2022-12-17 DIAGNOSIS — N816 Rectocele: Secondary | ICD-10-CM

## 2022-12-17 MED ORDER — ESTROGENS CONJUGATED 0.625 MG/GM VA CREA: 1.0000 | TOPICAL_CREAM | VAGINAL | 5 refills | Status: DC

## 2022-12-17 NOTE — Addendum Note (Signed)
Addended by: Berton Mount on: 12/17/2022 09:16 AM   Modules accepted: Orders

## 2022-12-17 NOTE — Patient Instructions (Signed)
Continue to use vaginal estrogen twice weekly.  We will see you back in 3 months.

## 2022-12-17 NOTE — Progress Notes (Signed)
[facility]   Subjective:     Chief Complaint:  Chief Complaint  Patient presents with   Pessary Check    Carrie Wheeler is a 82 y.o. female here for a pessary check.    History of Present Illness: Carrie Wheeler is a 82 y.o. female with stage II pelvic organ prolapse who presents for a pessary check. She is using a size #4 incontinence dish pessary. The pessary has been working well and she has no complaints. She is using vaginal estrogen. She denies vaginal bleeding.  Past Medical History: Patient  has a past medical history of Environmental allergies, GERD (gastroesophageal reflux disease), Hyperlipidemia, and Hypertension.   Past Surgical History: She  has a past surgical history that includes Tonsillectomy; Colonoscopy; Colonoscopy with propofol (N/A, 11/27/2015); Colonoscopy with propofol (N/A, 05/18/2021); Esophagogastroduodenoscopy (egd) with propofol (N/A, 05/18/2021); polypectomy (N/A, 05/18/2021); and Colonoscopy with propofol (N/A, 08/16/2022).   Medications: She has a current medication list which includes the following prescription(s): atorvastatin, calcium carbonate, cetirizine, vitamin d, premarin, fluticasone, hydrochlorothiazide, lactobacillus, and pantoprazole.   Allergies: Patient has No Known Allergies.   Social History: Patient  reports that she has never smoked. She has never used smokeless tobacco. She reports that she does not drink alcohol and does not use drugs.      Objective:    Physical Exam: BP (!) 166/73   Pulse 75  Gen: No apparent distress, A&O x 3. Detailed Urogynecologic Evaluation:  Pelvic Exam: Normal external female genitalia; Bartholin's and Skene's glands normal in appearance; urethral meatus normal in appearance, no urethral masses or discharge. The pessary was noted to be in place. It was removed and cleaned. Speculum exam revealed no lesions in the vagina. The pessary was replaced. It was comfortable to the patient and  fit well.    Assessment/Plan:    Assessment: Carrie Wheeler is a 82 y.o. with stage II pelvic organ prolapse here for a pessary check. She is doing well.  Plan: She will keep the pessary in place until next visit. She will continue to use estrogen. She will follow-up in 3 months for a pessary check or sooner as needed.   Denies any other concerns at today's visit. Encouraged her if she has any bleeding or other concerns she can call the office or message on Mychart.   All questions were answered.

## 2023-01-08 ENCOUNTER — Other Ambulatory Visit: Payer: Self-pay | Admitting: Family Medicine

## 2023-01-10 NOTE — Telephone Encounter (Signed)
Requested Prescriptions  Pending Prescriptions Disp Refills   hydrochlorothiazide (HYDRODIURIL) 25 MG tablet [Pharmacy Med Name: HYDROCHLOROTHIAZIDE '25MG'$  TABLETS] 90 tablet 0    Sig: TAKE 1 TABLET(25 MG) BY MOUTH DAILY     Cardiovascular: Diuretics - Thiazide Failed - 01/08/2023 10:10 AM      Failed - Last BP in normal range    BP Readings from Last 1 Encounters:  12/17/22 (!) 166/73         Passed - Cr in normal range and within 180 days    Creatinine  Date Value Ref Range Status  03/02/2015 0.67 mg/dL Final    Comment:    0.44-1.00 NOTE: New Reference Range  02/11/15    Creatinine, Ser  Date Value Ref Range Status  11/09/2022 1.00 0.57 - 1.00 mg/dL Final         Passed - K in normal range and within 180 days    Potassium  Date Value Ref Range Status  11/09/2022 4.7 3.5 - 5.2 mmol/L Final  03/02/2015 4.3 mmol/L Final    Comment:    3.5-5.1 NOTE: New Reference Range  02/11/15          Passed - Na in normal range and within 180 days    Sodium  Date Value Ref Range Status  11/09/2022 143 134 - 144 mmol/L Final  03/02/2015 139 mmol/L Final    Comment:    135-145 NOTE: New Reference Range  02/11/15          Passed - Valid encounter within last 6 months    Recent Outpatient Visits           2 months ago Encounter for [employer] annual wellness exam   [place], Dionne Bucy, MD   7 months ago Essential (primary) hypertension   [place], Dionne Bucy, MD   8 months ago Essential (primary) hypertension   [place], Dionne Bucy, MD   10 months ago Non-restorative sleep   [place], Dionne Bucy, MD   1 year ago Encounter for Medicare annual wellness exam   [place], Dionne Bucy, MD       Future Appointments             In 2 months Wannetta Sender, Governor Rooks, MD [facility]  [facility] at [facility], [facility]   In 4 months Bacigalupo, Dionne Bucy, MD [facility], [facility]

## 2023-01-22 ENCOUNTER — Other Ambulatory Visit: Payer: Self-pay | Admitting: Gastroenterology

## 2023-01-26 ENCOUNTER — Other Ambulatory Visit: Payer: Self-pay | Admitting: Family Medicine

## 2023-03-18 ENCOUNTER — Encounter: Payer: Self-pay | Admitting: Obstetrics and Gynecology

## 2023-03-18 ENCOUNTER — Ambulatory Visit: Payer: Medicare Other | Admitting: Obstetrics and Gynecology

## 2023-03-18 VITALS — BP 142/75 | HR 72

## 2023-03-18 DIAGNOSIS — N393 Stress incontinence (female) (male): Secondary | ICD-10-CM | POA: Diagnosis not present

## 2023-03-18 DIAGNOSIS — N816 Rectocele: Secondary | ICD-10-CM | POA: Diagnosis not present

## 2023-03-18 NOTE — Progress Notes (Signed)
[facility] Return Visit  SUBJECTIVE  History of Present Illness: Carrie Wheeler is a 82 y.o. female seen in follow-up for stage II posterior prolapse and incontinence here for a pessary check.   She has a #4 incontinence dish pessary. Denies vaginal bleeding.  She is using vaginal estrogen (premarin) cream twice a week. She has no complains and pessary is working well.   Past Medical History: Patient  has a past medical history of Environmental allergies, GERD (gastroesophageal reflux disease), Hyperlipidemia, and Hypertension.   Past Surgical History: She  has a past surgical history that includes Tonsillectomy; Colonoscopy; Colonoscopy with propofol (N/A, 11/27/2015); Colonoscopy with propofol (N/A, 05/18/2021); Esophagogastroduodenoscopy (egd) with propofol (N/A, 05/18/2021); polypectomy (N/A, 05/18/2021); and Colonoscopy with propofol (N/A, 08/16/2022).   Medications: She has a current medication list which includes the following prescription(s): atorvastatin, calcium carbonate, cetirizine, vitamin d, conjugated estrogens, fluticasone, hydrochlorothiazide, lactobacillus, and pantoprazole.   Allergies: Patient has No Known Allergies.   Social History: Patient  reports that she has never smoked. She has never used smokeless tobacco. She reports that she does not drink alcohol and does not use drugs.      OBJECTIVE     Physical Exam: Vitals:   03/18/23 0820  BP: (!) 151/81  Pulse: 72    Gen: No apparent distress, A&O x 3.  Detailed Urogynecologic Evaluation:  Pessary was noted to be in place. It was removed, and cleaned. Speculum exam reveals normal mucosa with no lesions. Pessary replaced and felt comfortable.   Prior exam showed:  POP-Q (10/14/21):    POP-Q   -2                                            Aa   -2                                           Ba   -6                                              C    2.5                                             Gh   3.5                                            Pb   9                                            tvl    0  Ap   0                                            Bp   -7                                              D        ASSESSMENT AND PLAN    Carrie Wheeler is a 82 y.o. with:  1. Prolapse of posterior vaginal wall   2. SUI (stress urinary incontinence, female)     - Pessary cleaned and replaced today - continue premarin cream twice a week - pessary cleanings q 4 months.   Marguerita Beards, MD  Time spent: I spent 20 minutes dedicated to the care of this patient on the date of this encounter to include pre-visit review of records, face-to-face time with the patient and post visit documentation.

## 2023-05-11 ENCOUNTER — Telehealth: Payer: Self-pay | Admitting: Gastroenterology

## 2023-05-11 NOTE — Telephone Encounter (Signed)
Pt left vm message to inquire about when should her next colonoscopy be would like a return call

## 2023-05-11 NOTE — Telephone Encounter (Signed)
I spoke to pt to let her know that she will not need to have another unless she was to develop any changes in bowel movement, rectal/abd pain... Pt expressed understanding

## 2023-05-12 ENCOUNTER — Ambulatory Visit (INDEPENDENT_AMBULATORY_CARE_PROVIDER_SITE_OTHER): Payer: Medicare Other | Admitting: Family Medicine

## 2023-05-12 ENCOUNTER — Encounter: Payer: Self-pay | Admitting: Family Medicine

## 2023-05-12 VITALS — BP 128/78 | HR 64 | Temp 97.9°F | Wt 220.0 lb

## 2023-05-12 DIAGNOSIS — G4733 Obstructive sleep apnea (adult) (pediatric): Secondary | ICD-10-CM | POA: Diagnosis not present

## 2023-05-12 DIAGNOSIS — K2100 Gastro-esophageal reflux disease with esophagitis, without bleeding: Secondary | ICD-10-CM

## 2023-05-12 DIAGNOSIS — E7800 Pure hypercholesterolemia, unspecified: Secondary | ICD-10-CM

## 2023-05-12 DIAGNOSIS — I10 Essential (primary) hypertension: Secondary | ICD-10-CM | POA: Diagnosis not present

## 2023-05-12 DIAGNOSIS — R7303 Prediabetes: Secondary | ICD-10-CM | POA: Diagnosis not present

## 2023-05-12 NOTE — Assessment & Plan Note (Signed)
Discussed importance of healthy weight management Discussed diet and exercise  

## 2023-05-12 NOTE — Assessment & Plan Note (Signed)
Patient is on Pantoprazole. Discussed long-term use and potential effects on calcium absorption. Patient expressed interest in trying dietary modifications. -Consider reducing Pantoprazole use if dietary modifications are effective.

## 2023-05-12 NOTE — Progress Notes (Signed)
Established patient visit   Patient: Carrie Wheeler   DOB: 1941-01-05   82 y.o. Female  MRN: [id number] Visit Date: 05/12/2023  Today's healthcare provider: Shirlee Latch, MD   Chief Complaint  Patient presents with   Hypertension   Subjective    HPI  Discussed the use of AI scribe software for clinical note transcription with the patient, who gave verbal consent to proceed.  History of Present Illness   The patient, with a history of hyperlipidemia, hypertension, and prediabetes, presents for a routine check-up. She reports taking atorvastatin 40 mg every other day for cholesterol management and hydrochlorothiazide 25 mg daily for blood pressure control. She also takes lactobacillus, a probiotic, and pantoprazole for reflux. She has been experiencing no side effects from these medications.  The patient's blood pressure was slightly elevated at the start of the visit, but she expects it to decrease after settling in. She has not been experiencing any abdominal pain or swelling.  The patient also has a history of severe sleep apnea, for which she underwent two sleep studies last year. She has not yet started using a CPAP machine due to discouragement with the process and a negative experience during the second sleep study. However, she is considering trying it due to the potential benefits for her sleep quality and overall health.       BP Readings from Last 3 Encounters:  05/12/23 128/78  03/18/23 (!) 142/75  12/17/22 (!) 166/73   Wt Readings from Last 3 Encounters:  05/12/23 220 lb (99.8 kg)  11/09/22 219 lb 12.8 oz (99.7 kg)  08/16/22 211 lb (95.7 kg)      Pertinent labs Lab Results  Component Value Date   CHOL 173 11/09/2022   HDL 58 11/09/2022   LDLCALC 98 11/09/2022   TRIG 93 11/09/2022   CHOLHDL 2.9 10/20/2021   Lab Results  Component Value Date   NA 143 11/09/2022   K 4.7 11/09/2022   CREATININE 1.00 11/09/2022   EGFR 58 (L) 11/09/2022   GLUCOSE  105 (H) 11/09/2022   TSH 2.590 08/30/2016     The 10-year ASCVD risk score (Arnett DK, et al., 2019) is: 31.6%  -----------------------------------------------------------------------------------------   Medications: Outpatient Medications Prior to Visit  Medication Sig   atorvastatin (LIPITOR) 40 MG tablet TAKE 1 TABLET(40 MG) BY MOUTH EVERY OTHER DAY   calcium carbonate (OS-CAL) 600 MG TABS tablet Take 1 tablet by mouth daily.   cetirizine (ZYRTEC) 10 MG tablet Take 1 tablet by mouth daily. As needed   Cholecalciferol (VITAMIN D) 2000 UNITS CAPS Take by mouth daily.    conjugated estrogens (PREMARIN) vaginal cream Place 1 Applicatorful vaginally 2 (two) times a week. Place 0.5g nightly for two weeks then twice a week after   fluticasone (FLONASE) 50 MCG/ACT nasal spray SHAKE LIQUID AND USE 2 SPRAYS IN EACH NOSTRIL EVERY DAY   hydrochlorothiazide (HYDRODIURIL) 25 MG tablet TAKE 1 TABLET(25 MG) BY MOUTH DAILY   Lactobacillus (PROBIOTIC ACIDOPHILUS PO) Take 1 tablet by mouth daily.   pantoprazole (PROTONIX) 40 MG tablet TAKE 1 TABLET(40 MG) BY MOUTH DAILY   No facility-administered medications prior to visit.    Review of Systems per HPI     Objective    BP 128/78 (BP Location: Right Arm, Patient Position: Sitting, Cuff Size: Large)   Pulse 64   Temp 97.9 F (36.6 C) (Oral)   Wt 220 lb (99.8 kg)   SpO2 96%   BMI 45.98 kg/m  Physical Exam Vitals reviewed.  Constitutional:      General: She is not in acute distress.    Appearance: Normal appearance. She is well-developed. She is not diaphoretic.  HENT:     Head: Normocephalic and atraumatic.  Eyes:     General: No scleral icterus.    Conjunctiva/sclera: Conjunctivae normal.  Neck:     Thyroid: No thyromegaly.  Cardiovascular:     Rate and Rhythm: Normal rate and regular rhythm.     Pulses: Normal pulses.     Heart sounds: Normal heart sounds. No murmur heard. Pulmonary:     Effort: Pulmonary effort is normal.  No respiratory distress.     Breath sounds: Normal breath sounds. No wheezing, rhonchi or rales.  Musculoskeletal:     Cervical back: Neck supple.     Right lower leg: No edema.     Left lower leg: No edema.  Lymphadenopathy:     Cervical: No cervical adenopathy.  Skin:    General: Skin is warm and dry.     Findings: No rash.  Neurological:     Mental Status: She is alert and oriented to person, place, and time. Mental status is at baseline.  Psychiatric:        Mood and Affect: Mood normal.        Behavior: Behavior normal.       No results found for any visits on 05/12/23.  Assessment & Plan     Problem List Items Addressed This Visit       Cardiovascular and Mediastinum   Essential (primary) hypertension - Primary    Elevated blood pressure reading at the start of the visit, but later well controlled. Patient is currently on Hydrochlorothiazide 25mg  daily. No reported side effects. -Continue Hydrochlorothiazide 25mg  daily.      Relevant Orders   Comprehensive metabolic panel     Respiratory   OSA (obstructive sleep apnea)    Severe sleep apnea diagnosed in previous sleep studies. Patient has not been using CPAP. Discussed the benefits and importance of CPAP use for cardiovascular health and blood pressure control. -Order CPAP machine with settings and mask size as determined in previous sleep study. -Provide support for patient in setting up and using CPAP machine.         Digestive   GERD (gastroesophageal reflux disease)    Patient is on Pantoprazole. Discussed long-term use and potential effects on calcium absorption. Patient expressed interest in trying dietary modifications. -Consider reducing Pantoprazole use if dietary modifications are effective.        Other   Hyperlipidemia    Patient is on Atorvastatin 40mg  every other day. -Continue Atorvastatin 40mg  every other day.      Relevant Orders   Comprehensive metabolic panel   Lipid Panel With  LDL/HDL Ratio   Morbid obesity (HCC)    Discussed importance of healthy weight management Discussed diet and exercise       Prediabetes    No new symptoms reported. -Order labs including A1c, kidney and liver function, and cholesterol.      Relevant Orders   Hemoglobin A1c     Return in about 6 months (around 11/11/2023) for CPE.      I, Shirlee Latch, MD, have reviewed all documentation for this visit. The documentation on 05/12/23 for the exam, diagnosis, procedures, and orders are all accurate and complete.   Krishika Bugge, Marzella Schlein, MD, MPH [facility]

## 2023-05-12 NOTE — Assessment & Plan Note (Signed)
Severe sleep apnea diagnosed in previous sleep studies. Patient has not been using CPAP. Discussed the benefits and importance of CPAP use for cardiovascular health and blood pressure control. -Order CPAP machine with settings and mask size as determined in previous sleep study. -Provide support for patient in setting up and using CPAP machine.

## 2023-05-12 NOTE — Addendum Note (Signed)
Addended by: Erasmo Downer on: 05/12/2023 12:15 PM   Modules accepted: Level of Service

## 2023-05-12 NOTE — Assessment & Plan Note (Signed)
Elevated blood pressure reading at the start of the visit, but later well controlled. Patient is currently on Hydrochlorothiazide 25mg  daily. No reported side effects. -Continue Hydrochlorothiazide 25mg  daily.

## 2023-05-12 NOTE — Assessment & Plan Note (Signed)
Patient is on Atorvastatin 40mg  every other day. -Continue Atorvastatin 40mg  every other day.

## 2023-05-12 NOTE — Assessment & Plan Note (Signed)
No new symptoms reported. -Order labs including A1c, kidney and liver function, and cholesterol.

## 2023-05-13 LAB — COMPREHENSIVE METABOLIC PANEL
ALT: 12 IU/L (ref 0–32)
AST: 18 IU/L (ref 0–40)
Albumin/Globulin Ratio: 1.5 (ref 1.2–2.2)
Albumin: 4 g/dL (ref 3.8–4.8)
Alkaline Phosphatase: 147 IU/L — ABNORMAL HIGH (ref 44–121)
BUN/Creatinine Ratio: 18 (ref 12–28)
BUN: 19 mg/dL (ref 8–27)
Bilirubin Total: 0.8 mg/dL (ref 0.0–1.2)
CO2: 25 mmol/L (ref 20–29)
Calcium: 9.1 mg/dL (ref 8.7–10.3)
Chloride: 102 mmol/L (ref 96–106)
Creatinine, Ser: 1.07 mg/dL — ABNORMAL HIGH (ref 0.57–1.00)
Globulin, Total: 2.6 g/dL (ref 1.5–4.5)
Glucose: 99 mg/dL (ref 70–99)
Potassium: 4.5 mmol/L (ref 3.5–5.2)
Sodium: 141 mmol/L (ref 134–144)
Total Protein: 6.6 g/dL (ref 6.0–8.5)
eGFR: 53 mL/min/{1.73_m2} — ABNORMAL LOW (ref >59)

## 2023-05-13 LAB — HEMOGLOBIN A1C
Est. average glucose Bld gHb Est-mCnc: 126 mg/dL
Hgb A1c MFr Bld: 6 % — ABNORMAL HIGH (ref 4.8–5.6)

## 2023-05-13 LAB — LIPID PANEL WITH LDL/HDL RATIO
Cholesterol, Total: 144 mg/dL (ref 100–199)
HDL: 50 mg/dL (ref >39)
LDL Chol Calc (NIH): 78 mg/dL (ref 0–99)
LDL/HDL Ratio: 1.6 ratio (ref 0.0–3.2)
Triglycerides: 85 mg/dL (ref 0–149)
VLDL Cholesterol Cal: 16 mg/dL (ref 5–40)

## 2023-05-16 ENCOUNTER — Other Ambulatory Visit: Payer: Self-pay | Admitting: Family Medicine

## 2023-05-16 DIAGNOSIS — J302 Other seasonal allergic rhinitis: Secondary | ICD-10-CM

## 2023-05-16 NOTE — Telephone Encounter (Signed)
Medication Refill - Medication: generic Flonase 50  Has the patient contacted their pharmacy? Yes.  She said it was expired (Agent: If no, request that the patient contact the pharmacy for the refill. If patient does not wish to contact the pharmacy document the reason why and proceed with request.) (Agent: If yes, when and what did the pharmacy advise?)  Preferred Pharmacy (with phone number or street name):  [facility] [address] Has the patient been seen for an appointment in the last year OR does the patient have an upcoming appointment? Yes.    Agent: Please be advised that RX refills may take up to 3 business days. We ask that you follow-up with your pharmacy.

## 2023-05-17 MED ORDER — FLUTICASONE PROPIONATE 50 MCG/ACT NA SUSP: NASAL | 11 refills | Status: DC

## 2023-05-17 NOTE — Telephone Encounter (Signed)
Requested Prescriptions  Pending Prescriptions Disp Refills   fluticasone (FLONASE) 50 MCG/ACT nasal spray 16 g 11    Sig: SHAKE LIQUID AND USE 2 SPRAYS IN EACH NOSTRIL EVERY DAY     Ear, Nose, and Throat: Nasal Preparations - Corticosteroids Passed - 05/16/2023 11:39 AM      Passed - Valid encounter within last 12 months    Recent Outpatient Visits           5 days ago Essential (primary) hypertension   [place] [facility] [place], Marzella Schlein, MD   6 months ago Encounter for [employer] annual wellness exam   [place] [facility] [place], Marzella Schlein, MD   11 months ago Essential (primary) hypertension   [place] [facility] [place], Marzella Schlein, MD   1 year ago Essential (primary) hypertension    [facility] [place], Marzella Schlein, MD   1 year ago Non-restorative sleep    [place] [facility] [place], Marzella Schlein, MD       Future Appointments             In 2 months Zuleta, Joan Mayans, NP  Urogynecology at [facility], [facility]   In 5 months Bacigalupo, Marzella Schlein, MD [facility], [facility]

## 2023-05-26 ENCOUNTER — Other Ambulatory Visit: Payer: Self-pay | Admitting: Gastroenterology

## 2023-06-01 DIAGNOSIS — G4733 Obstructive sleep apnea (adult) (pediatric): Secondary | ICD-10-CM | POA: Diagnosis not present

## 2023-06-27 ENCOUNTER — Other Ambulatory Visit: Payer: Self-pay | Admitting: Family Medicine

## 2023-06-27 DIAGNOSIS — E7800 Pure hypercholesterolemia, unspecified: Secondary | ICD-10-CM

## 2023-07-01 DIAGNOSIS — G4733 Obstructive sleep apnea (adult) (pediatric): Secondary | ICD-10-CM | POA: Diagnosis not present

## 2023-07-10 ENCOUNTER — Encounter: Payer: Self-pay | Admitting: Pharmacist

## 2023-07-10 NOTE — Progress Notes (Signed)
   07/10/2023  Patient ID: Carrie Wheeler, female   DOB: 03-06-1941, 82 y.o.   MRN: [id number]  Pharmacy Quality Measure Review  This patient is appearing on a report related to adherence measure for cholesterol (statin) medications this calendar year.   Medication: atorvastatin 40 mg Last fill date: 06/27/2023 for 90 day supply  Insurance report not up to date. No action needed at this time.  Estelle Grumbles, PharmD, [facility] [phone]

## 2023-07-18 ENCOUNTER — Encounter: Payer: Self-pay | Admitting: Obstetrics and Gynecology

## 2023-07-18 ENCOUNTER — Ambulatory Visit (INDEPENDENT_AMBULATORY_CARE_PROVIDER_SITE_OTHER): Payer: Medicare Other | Admitting: Obstetrics and Gynecology

## 2023-07-18 VITALS — BP 133/79 | HR 83

## 2023-07-18 DIAGNOSIS — N816 Rectocele: Secondary | ICD-10-CM | POA: Diagnosis not present

## 2023-07-18 DIAGNOSIS — N393 Stress incontinence (female) (male): Secondary | ICD-10-CM

## 2023-07-18 DIAGNOSIS — N952 Postmenopausal atrophic vaginitis: Secondary | ICD-10-CM

## 2023-07-18 DIAGNOSIS — Z4689 Encounter for fitting and adjustment of other specified devices: Secondary | ICD-10-CM

## 2023-07-18 NOTE — Patient Instructions (Signed)
For your overactive bladder there are a few other medication options we could try if you would like including Vibegron and Trospium.   Third line therapy options include PTNS, Bladder Botox, and Sacral Neuromodulation

## 2023-07-18 NOTE — Progress Notes (Signed)
[facility]   Subjective:     Chief Complaint:  Chief Complaint  Patient presents with   Pessary Check    Carrie Wheeler is a 82 y.o. female here for a pessary check.   History of Present Illness: Carrie Wheeler is a 82 y.o. female with stage II pelvic organ prolapse who presents for a pessary check. She is using a size #4 incontinence dish pessary. The pessary has been working well and she has no complaints. She is using vaginal estrogen. She reports some minimal vaginal bleeding.  Past Medical History: Patient  has a past medical history of Environmental allergies, GERD (gastroesophageal reflux disease), Hyperlipidemia, and Hypertension.   Past Surgical History: She  has a past surgical history that includes Tonsillectomy; Colonoscopy; Colonoscopy with propofol (N/A, 11/27/2015); Colonoscopy with propofol (N/A, 05/18/2021); Esophagogastroduodenoscopy (egd) with propofol (N/A, 05/18/2021); polypectomy (N/A, 05/18/2021); and Colonoscopy with propofol (N/A, 08/16/2022).   Medications: She has a current medication list which includes the following prescription(s): atorvastatin, calcium carbonate, cetirizine, vitamin d, conjugated estrogens, fluticasone, hydrochlorothiazide, lactobacillus, and pantoprazole.   Allergies: Patient has No Known Allergies.   Social History: Patient  reports that she has never smoked. She has never used smokeless tobacco. She reports that she does not drink alcohol and does not use drugs.      Objective:    Physical Exam: BP (!) 154/71   Pulse 78  Gen: No apparent distress, A&O x 3. Detailed Urogynecologic Evaluation:  Pelvic Exam: Normal external female genitalia; Bartholin's and Skene's glands normal in appearance; urethral meatus normal in appearance, no urethral masses or discharge. The pessary was noted to be in place. It was removed and cleaned. Speculum exam revealed erythema in the vagina. The pessary was replaced. It was comfortable  to the patient and fit well.   POP-Q (10/14/21):    POP-Q   -2                                            Aa   -2                                           Ba   -6                                              C    2.5                                            Gh   3.5                                            Pb   9  tvl    0                                            Ap   0                                            Bp   -7                                                   Assessment/Plan:    Assessment: [name] is a 82 y.o. with stage II pelvic organ prolapse here for a pessary check. She is doing well.  Plan: She will keep the pessary in place until next visit. She will continue to use estrogen.   She reports her SUI is somewhat controlled but her OAB symptoms are not well controlled. She reports she is constantly leaking. She has previously tried Myrbetriq 25mg  for OAB and did not notice much improvement. She has also done Physical therapy.   We discussed that there are other medication options as well as third line options.She reports she is still working so the time commitment of PTNS would be difficult but she will consider her options and let me know at her next visit if she wants to pursue anything for OAB treatment.   For her vaginal atrophy she is going to try and put the estrogen cream further into the vagina using the applicator. If this is not helpful and she still has irritation, will consider looking into E-string or Yuvafem tablet for patient.    She will follow-up in 3 months for a pessary check or sooner as needed.    All questions were answered.

## 2023-07-19 ENCOUNTER — Telehealth: Payer: Self-pay | Admitting: Family Medicine

## 2023-07-19 MED ORDER — HYDROCHLOROTHIAZIDE 25 MG PO TABS: 25.0000 mg | ORAL_TABLET | Freq: Every day | ORAL | 0 refills | Status: DC

## 2023-07-19 NOTE — Telephone Encounter (Signed)
[facility] faxed refill request for the following medications:  hydrochlorothiazide (HYDRODIURIL) 25 MG tablet    Please advise.  

## 2023-08-01 DIAGNOSIS — G4733 Obstructive sleep apnea (adult) (pediatric): Secondary | ICD-10-CM | POA: Diagnosis not present

## 2023-08-04 DIAGNOSIS — G4733 Obstructive sleep apnea (adult) (pediatric): Secondary | ICD-10-CM | POA: Diagnosis not present

## 2023-08-25 ENCOUNTER — Encounter: Payer: Self-pay | Admitting: Family Medicine

## 2023-08-25 ENCOUNTER — Ambulatory Visit (INDEPENDENT_AMBULATORY_CARE_PROVIDER_SITE_OTHER): Payer: Medicare Other | Admitting: Family Medicine

## 2023-08-25 VITALS — BP 120/60 | HR 66 | Ht <= 58 in | Wt 217.7 lb

## 2023-08-25 DIAGNOSIS — Z23 Encounter for immunization: Secondary | ICD-10-CM

## 2023-08-25 DIAGNOSIS — I10 Essential (primary) hypertension: Secondary | ICD-10-CM | POA: Diagnosis not present

## 2023-08-25 DIAGNOSIS — G4733 Obstructive sleep apnea (adult) (pediatric): Secondary | ICD-10-CM

## 2023-08-25 NOTE — Assessment & Plan Note (Signed)
Patient is using CPAP regularly for at least 4.5 hours a night. Reports dry mouth and noise as main issues. Discussed mask options and cleaning methods. -Continue CPAP use with current nasal pillow mask. -Wash and try full face mask to potentially reduce dry mouth. -Check in with CPAP supply company regarding payments and supplies.

## 2023-08-25 NOTE — Assessment & Plan Note (Signed)
Initial blood pressure was elevated, but repeat measurement was within normal limits. Patient had not taken medication prior to appointment. -Continue current antihypertensive regimen. -Encourage patient to take medication prior to future appointments.

## 2023-08-25 NOTE — Progress Notes (Signed)
Established Patient Office Visit  Subjective   Patient ID: Carrie Wheeler, female    DOB: 07/13/1941  Age: 82 y.o. MRN: [id number]  Chief Complaint  Patient presents with   Medical Management of Chronic Issues    Follow up on CPAP, would like to discuss " How one would know if the CPAP is working for them", the pressure of the CPAP and if you can change it, Real dry mouth due to CPAP    HPI  Discussed the use of AI scribe software for clinical note transcription with the patient, who gave verbal consent to proceed.  History of Present Illness   The patient, with a history of sleep apnea, presents for a follow-up regarding their CPAP use. They report using the CPAP regularly, aiming for at least four and a half hours per night, but sometimes need to take breaks due to dry mouth and noise from the machine. They have experienced issues with their mask, including a poor seal and an unpleasant smell, which they attribute to possible exposure to cigarette smoke. They have tried different types of masks, but prefer the nasal pillows, despite the dry mouth issue. They report feeling better and more rested since starting CPAP therapy.         ROS per HPI    Objective:     BP 120/60 (BP Location: Left Arm, Patient Position: Sitting, Cuff Size: Large)   Pulse 66   Ht 4\' 10"  (1.473 m)   Wt 217 lb 11.2 oz (98.7 kg)   SpO2 97%   BMI 45.50 kg/m    Physical Exam Vitals reviewed.  Constitutional:      General: She is not in acute distress.    Appearance: Normal appearance. She is well-developed. She is not diaphoretic.  HENT:     Head: Normocephalic and atraumatic.  Eyes:     General: No scleral icterus.    Conjunctiva/sclera: Conjunctivae normal.  Neck:     Thyroid: No thyromegaly.  Cardiovascular:     Rate and Rhythm: Normal rate and regular rhythm.     Heart sounds: Normal heart sounds. No murmur heard. Pulmonary:     Effort: Pulmonary effort is normal. No respiratory  distress.     Breath sounds: Normal breath sounds. No wheezing, rhonchi or rales.  Musculoskeletal:     Cervical back: Neck supple.     Right lower leg: No edema.     Left lower leg: No edema.  Lymphadenopathy:     Cervical: No cervical adenopathy.  Skin:    General: Skin is warm and dry.     Findings: No rash.  Neurological:     Mental Status: She is alert and oriented to person, place, and time. Mental status is at baseline.  Psychiatric:        Mood and Affect: Mood normal.        Behavior: Behavior normal.      No results found for any visits on 08/25/23.    The 10-year ASCVD risk score (Arnett DK, et al., 2019) is: 28%    Assessment & Plan:   Problem List Items Addressed This Visit       Cardiovascular and Mediastinum   Essential (primary) hypertension - Primary    Initial blood pressure was elevated, but repeat measurement was within normal limits. Patient had not taken medication prior to appointment. -Continue current antihypertensive regimen. -Encourage patient to take medication prior to future appointments.        Respiratory  OSA (obstructive sleep apnea)    Patient is using CPAP regularly for at least 4.5 hours a night. Reports dry mouth and noise as main issues. Discussed mask options and cleaning methods. -Continue CPAP use with current nasal pillow mask. -Wash and try full face mask to potentially reduce dry mouth. -Check in with CPAP supply company regarding payments and supplies.      Other Visit Diagnoses     Needs flu shot       Relevant Orders   Flu Vaccine Trivalent High Dose (Fluad) (Completed)        Return in about 3 months (around 11/24/2023) for as scheduled.    Shirlee Latch, MD

## 2023-08-29 ENCOUNTER — Ambulatory Visit: Payer: Medicare Other | Admitting: Family Medicine

## 2023-09-01 DIAGNOSIS — G4733 Obstructive sleep apnea (adult) (pediatric): Secondary | ICD-10-CM | POA: Diagnosis not present

## 2023-10-01 DIAGNOSIS — G4733 Obstructive sleep apnea (adult) (pediatric): Secondary | ICD-10-CM | POA: Diagnosis not present

## 2023-10-04 ENCOUNTER — Ambulatory Visit: Payer: Medicare Other

## 2023-10-04 DIAGNOSIS — Z0000 Encounter for general adult medical examination without abnormal findings: Secondary | ICD-10-CM

## 2023-10-04 NOTE — Progress Notes (Signed)
Subjective:   Carrie Wheeler is a 82 y.o. female who presents for Medicare Annual (Subsequent) preventive examination.  Visit Complete: Virtual I connected with  Elbert Ewings on 10/04/23 by a audio enabled telemedicine application and verified that I am speaking with the correct person using two identifiers.  Patient Location: Home  Provider Location: Office/Clinic  I discussed the limitations of evaluation and management by telemedicine. The patient expressed understanding and agreed to proceed.  Vital Signs: Because this visit was a virtual/telehealth visit, some criteria may be missing or patient reported. Any vitals not documented were not able to be obtained and vitals that have been documented are patient reported.   Cardiac Risk Factors include: advanced age (>19men, >31 women);hypertension;dyslipidemia;obesity (BMI >30kg/m2)     Objective:    There were no vitals filed for this visit. There is no height or weight on file to calculate BMI.     10/04/2023   11:14 AM 08/16/2022    7:51 AM 05/18/2021   10:54 AM 10/14/2020    8:32 AM 10/09/2019    8:26 AM 09/18/2018    8:44 AM 09/12/2017    9:07 AM  Advanced Directives  Does Patient Have a Medical Advance Directive? Yes No Yes Yes Yes Yes Yes  Type of Estate agent of [place];Living will  Healthcare Power of [place];Living will Healthcare Power of [place];Living will Healthcare Power of [place];Living will Healthcare Power of [place];Living will Healthcare Power of [place];Living will  Does patient want to make changes to medical advance directive? No - Patient declined  No - Patient declined      Copy of Healthcare Power of Attorney in Chart? Yes - validated most recent copy scanned in chart (See row information)  Yes - validated most recent copy scanned in chart (See row information) Yes - validated most recent copy scanned in chart (See row information) No - copy requested No - copy requested    Would patient like information on creating a medical advance directive?  No - Patient declined         Current Medications (verified) Outpatient Encounter Medications as of 10/04/2023  Medication Sig   atorvastatin (LIPITOR) 40 MG tablet TAKE 1 TABLET(40 MG) BY MOUTH EVERY OTHER DAY   calcium carbonate (OS-CAL) 600 MG TABS tablet Take 1 tablet by mouth daily.   cetirizine (ZYRTEC) 10 MG tablet Take 1 tablet by mouth daily. As needed   Cholecalciferol (VITAMIN D) 2000 UNITS CAPS Take by mouth daily.    conjugated estrogens (PREMARIN) vaginal cream Place 1 Applicatorful vaginally 2 (two) times a week. Place 0.5g nightly for two weeks then twice a week after   fluticasone (FLONASE) 50 MCG/ACT nasal spray SHAKE LIQUID AND USE 2 SPRAYS IN EACH NOSTRIL EVERY DAY   hydrochlorothiazide (HYDRODIURIL) 25 MG tablet Take 1 tablet (25 mg total) by mouth daily.   Lactobacillus (PROBIOTIC ACIDOPHILUS PO) Take 1 tablet by mouth daily.   pantoprazole (PROTONIX) 40 MG tablet Take 1 tablet (40 mg total) by mouth daily. SCHEDULE OFFICE VISIT   No facility-administered encounter medications on file as of 10/04/2023.    Allergies (verified) Patient has no known allergies.   History: Past Medical History:  Diagnosis Date   Environmental allergies    GERD (gastroesophageal reflux disease)    occasional   Hyperlipidemia    Hypertension    Past Surgical History:  Procedure Laterality Date   COLONOSCOPY     COLONOSCOPY WITH PROPOFOL N/A 11/27/2015   Procedure: COLONOSCOPY  WITH PROPOFOL;  Surgeon: Midge Minium, MD;  Location[facility];  Service: Endoscopy;  Laterality: N/A;   COLONOSCOPY WITH PROPOFOL N/A 05/18/2021   Procedure: COLONOSCOPY WITH PROPOFOL;  Surgeon: Midge Minium, MD;  Location: [facility];  Service: Endoscopy;  Laterality: N/A;   COLONOSCOPY WITH PROPOFOL N/A 08/16/2022   Procedure: COLONOSCOPY/POLYPECTOMY  WITH PROPOFOL;  Surgeon: Midge Minium, MD;  Location: [facility];  Service: Endoscopy;  Laterality: N/A;   ESOPHAGOGASTRODUODENOSCOPY (EGD) WITH PROPOFOL N/A 05/18/2021   Procedure: ESOPHAGOGASTRODUODENOSCOPY (EGD) WITH PROPOFOL;  Surgeon: Midge Minium, MD;  Location: [facility];  Service: Endoscopy;  Laterality: N/A;   POLYPECTOMY N/A 05/18/2021   Procedure: POLYPECTOMY;  Surgeon: Midge Minium, MD;  Location: [facility];  Service: Endoscopy;  Laterality: N/A;   TONSILLECTOMY     Family History  Problem Relation Age of Onset   Diabetes Mother    Hypertension Father    Colon cancer Father    Heart Problems Brother        Congential heart defect   Healthy Brother    Liver disease Son    Alzheimer's disease Other    Diabetes Other    Colon cancer Other    Breast cancer Neg Hx    Social History   Socioeconomic History   Marital status: Widowed    Spouse name: Not on file   Number of children: 0   Years of education: HS   Highest education level: High school graduate  Occupational History    Employer: [employer]    Comment: part-time  Tobacco Use   Smoking status: Never   Smokeless tobacco: Never  Vaping Use   Vaping status: Never Used  Substance and Sexual Activity   Alcohol use: Never    Comment: 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   Social Determinants of Health   Financial Resource Strain: Low Risk  (10/04/2023)   Overall Financial Resource Strain (CARDIA)    Difficulty of Paying Living Expenses: Not hard at all  Food Insecurity: No Food Insecurity (10/04/2023)   Hunger Vital Sign    Worried About Running Out of Food in the Last Year: Never true    Ran Out of Food in the Last Year: Never true  Transportation Needs: No Transportation Needs (10/04/2023)   PRAPARE - Administrator, Civil Service  (Medical): No    Lack of Transportation (Non-Medical): No  Physical Activity: Insufficiently Active (10/04/2023)   Exercise Vital Sign    Days of Exercise per Week: 3 days    Minutes of Exercise per Session: 30 min  Stress: No Stress Concern Present (10/04/2023)   [employer] of Occupational Health - Occupational Stress Questionnaire    Feeling of Stress : Not at all  Social Connections: Moderately Integrated (10/04/2023)   Social Connection and Isolation Panel [NHANES]    Frequency of Communication with Friends and Family: More than three times a week    Frequency of Social Gatherings with Friends and Family: Never    Attends Religious Services: More than 4 times per year    Active Member of [employer] or Organizations: Yes    Attends Banker Meetings: More than 4 times per year    Marital  Status: Widowed    Tobacco Counseling Counseling given: Not Answered   Clinical Intake:  Pre-visit preparation completed: Yes  Pain : No/denies pain     Nutritional Status: BMI > 30  Obese Nutritional Risks: None Diabetes: No  How often do you need to have someone help you when you read instructions, pamphlets, or other written materials from your doctor or pharmacy?: 1 - Never  Interpreter Needed?: No  Information entered by :: Kennedy Bucker, LPN   Activities of Daily Living    10/04/2023   11:15 AM 05/12/2023    8:20 AM  In your present state of health, do you have any difficulty performing the following activities:  Hearing? 0 0  Vision? 0 0  Difficulty concentrating or making decisions? 0 0  Walking or climbing stairs? 1 1  Comment pain in legs   Dressing or bathing? 0 0  Doing errands, shopping? 0 0  Preparing Food and eating ? N   Using the Toilet? N   In the past six months, have you accidently leaked urine? N   Do you have problems with loss of bowel control? N   Managing your Medications? N   Managing your Finances? N   Housekeeping or managing your  Housekeeping? N     Patient Care Team: Erasmo Downer, MD as PCP - General (Family Medicine) Ovidio Kin, DO as Referring Physician (Chiropractic Medicine) Dingeldein, Viviann Spare, MD (Ophthalmology)  Indicate any recent Medical Services you may have received from other than [facility] providers in the past year (date may be approximate).     Assessment:   This is a routine wellness examination for [place].  Hearing/Vision screen Hearing Screening - Comments:: No aids Vision Screening - Comments:: No glasses- Dr.Dingeldein   Goals Addressed             This Visit's Progress    DIET - EAT MORE FRUITS AND VEGETABLES         Depression Screen    10/04/2023   11:12 AM 08/25/2023    8:23 AM 05/12/2023    8:20 AM 11/09/2022    8:30 AM 05/24/2022    8:16 AM 04/20/2022    8:35 AM 03/16/2022    8:30 AM  PHQ 2/9 Scores  PHQ - 2 Score 0 0 0 0 0 0 0  PHQ- 9 Score 0 1 1 0 1 1 0    Fall Risk    10/04/2023   11:14 AM 08/25/2023    8:23 AM 05/12/2023    8:20 AM 11/09/2022    8:29 AM 05/24/2022    8:16 AM  Fall Risk   Falls in the past year? 0 0 1 1 1   Number falls in past yr: 0 0 0 0 0  Injury with Fall? 0 0 0 0 0  Risk for fall due to : No Fall Risks   No Fall Risks   Follow up Falls prevention discussed;Falls evaluation completed   Falls evaluation completed     MEDICARE RISK AT HOME: Medicare Risk at Home Any stairs in or around the home?: Yes If so, are there any without handrails?: No Home free of loose throw rugs in walkways, pet beds, electrical cords, etc?: Yes Adequate lighting in your home to reduce risk of falls?: Yes Life alert?: No Use of a cane, walker or w/c?: Yes (cane occasionally) Grab bars in the bathroom?: Yes Shower chair or bench in shower?: Yes Elevated toilet seat or a handicapped toilet?: No  TIMED UP AND GO:  Was the test performed?  No    Cognitive Function:        10/04/2023   11:16 AM 11/09/2022    8:31 AM 10/20/2021    8:29 AM 10/14/2020     8:37 AM  6CIT Screen  What Year? 0 points 0 points 0 points 0 points  What month? 0 points 0 points 0 points 0 points  What time? 0 points 0 points 0 points 0 points  Count back from 20 0 points 0 points 0 points 0 points  Months in reverse 0 points 0 points 0 points 0 points  Repeat phrase 0 points 0 points 2 points 0 points  Total Score 0 points 0 points 2 points 0 points    Immunizations Immunization History  Administered Date(s) Administered   Fluad Quad(high Dose 65+) 10/20/2021, 11/09/2022   Fluad Trivalent(High Dose 65+) 08/25/2023   Influenza, High Dose Seasonal PF 09/12/2017, 09/18/2018, 10/03/2019, 09/16/2020   PFIZER(Purple Top)SARS-COV-2 Vaccination 01/30/2020, 02/20/2020, 10/14/2020   Pneumococcal Conjugate-13 05/19/2015   Pneumococcal Polysaccharide-23 04/02/2011   Td 06/01/2006, 08/30/2016   Tdap 06/01/2006    TDAP status: Up to date  Flu Vaccine status: Up to date  Pneumococcal vaccine status: Up to date  Covid-19 vaccine status: Completed vaccines  Qualifies for Shingles Vaccine? Yes   Zostavax completed No   Shingrix Completed?: No.    Education has been provided regarding the importance of this vaccine. Patient has been advised to call insurance company to determine out of pocket expense if they have not yet received this vaccine. Advised may also receive vaccine at local pharmacy or Health Dept. Verbalized acceptance and understanding.  Screening Tests Health Maintenance  Topic Date Due   Zoster Vaccines- Shingrix (1 of 2) Never done   COVID-19 Vaccine (4 - 2023-24 season) 08/07/2023   MAMMOGRAM  10/06/2023   Medicare Annual Wellness (AWV)  10/03/2024   DTaP/Tdap/Td (4 - Td or Tdap) 08/30/2026   Pneumonia Vaccine 40+ Years old  Completed   INFLUENZA VACCINE  Completed   DEXA SCAN  Completed   HPV VACCINES  Aged Out   Colonoscopy  Discontinued    Health Maintenance  Health Maintenance Due  Topic Date Due   Zoster Vaccines- Shingrix (1 of  2) Never done   COVID-19 Vaccine (4 - 2023-24 season) 08/07/2023    Colorectal cancer screening: No longer required.   Mammogram status: No longer required due to age.  Declined BDS  Lung Cancer Screening: (Low Dose CT Chest recommended if Age 16-80 years, 20 pack-year currently smoking OR have quit w/in 15years.) does not qualify.    Additional Screening:  Hepatitis C Screening: does qualify; Completed no  Vision Screening: Recommended annual ophthalmology exams for early detection of glaucoma and other disorders of the eye. Is the patient up to date with their annual eye exam?  Yes  Who is the provider or what is the name of the office in which the patient attends annual eye exams? Dr.Dingeldein If pt is not established with a provider, would they like to be referred to a provider to establish care? No .   Dental Screening: Recommended annual dental exams for proper oral hygiene   Community Resource Referral / Chronic Care Management: CRR required this visit?  No   CCM required this visit?  No     Plan:     I have personally reviewed and noted the following in the patient's chart:   Medical and social history  Use of alcohol, tobacco or illicit drugs  Current medications and supplements including opioid prescriptions. Patient is not currently taking opioid prescriptions. Functional ability and status Nutritional status Physical activity Advanced directives List of other physicians Hospitalizations, surgeries, and ER visits in previous 12 months Vitals Screenings to include cognitive, depression, and falls Referrals and appointments  In addition, I have reviewed and discussed with patient certain preventive protocols, quality metrics, and best practice recommendations. A written personalized care plan for preventive services as well as general preventive health recommendations were provided to patient.     Hal Hope, LPN   [id number]   After Visit  Summary: (MyChart) Due to this being a telephonic visit, the after visit summary with patients personalized plan was offered to patient via MyChart   Nurse Notes: none

## 2023-10-04 NOTE — Patient Instructions (Addendum)
Carrie Wheeler , Thank you for taking time to come for your Medicare Wellness Visit. I appreciate your ongoing commitment to your health goals. Please review the following plan we discussed and let me know if I can assist you in the future.   Referrals/Orders/Follow-Ups/Clinician Recommendations: none  This is a list of the screening recommended for you and due dates:  Health Maintenance  Topic Date Due   Zoster (Shingles) Vaccine (1 of 2) Never done   COVID-19 Vaccine (4 - 2023-24 season) 08/07/2023   Mammogram  10/06/2023   Medicare Annual Wellness Visit  10/03/2024   DTaP/Tdap/Td vaccine (4 - Td or Tdap) 08/30/2026   Pneumonia Vaccine  Completed   Flu Shot  Completed   DEXA scan (bone density measurement)  Completed   HPV Vaccine  Aged Out   Colon Cancer Screening  Discontinued    Advanced directives: (In Chart) A copy of your advanced directives are scanned into your chart should your provider ever need it.  Next Medicare Annual Wellness Visit scheduled for next year: Yes   10/09/24 @ 9:35 am in person

## 2023-10-16 ENCOUNTER — Other Ambulatory Visit: Payer: Self-pay | Admitting: Family Medicine

## 2023-10-18 NOTE — Telephone Encounter (Signed)
Labs in date  Requested Prescriptions  Pending Prescriptions Disp Refills   hydrochlorothiazide (HYDRODIURIL) 25 MG tablet [Pharmacy Med Name: HYDROCHLOROTHIAZIDE 25MG  TABLETS] 90 tablet 0    Sig: TAKE 1 TABLET(25 MG) BY MOUTH DAILY     Cardiovascular: Diuretics - Thiazide Failed - 10/16/2023 10:35 AM      Failed - Cr in normal range and within 180 days    Creatinine  Date Value Ref Range Status  03/02/2015 0.67 mg/dL Final    Comment:    4.33-2.95 NOTE: New Reference Range  02/11/15    Creatinine, Ser  Date Value Ref Range Status  05/12/2023 1.07 (H) 0.57 - 1.00 mg/dL Final         Passed - K in normal range and within 180 days    Potassium  Date Value Ref Range Status  05/12/2023 4.5 3.5 - 5.2 mmol/L Final  03/02/2015 4.3 mmol/L Final    Comment:    3.5-5.1 NOTE: New Reference Range  02/11/15          Passed - Na in normal range and within 180 days    Sodium  Date Value Ref Range Status  05/12/2023 141 134 - 144 mmol/L Final  03/02/2015 139 mmol/L Final    Comment:    135-145 NOTE: New Reference Range  02/11/15          Passed - Last BP in normal range    BP Readings from Last 1 Encounters:  08/25/23 120/60         Passed - Valid encounter within last 6 months    Recent Outpatient Visits           1 month ago Essential (primary) hypertension   [place] [facility] [place], Marzella Schlein, MD   5 months ago Essential (primary) hypertension   [place] [facility] [place], Marzella Schlein, MD   11 months ago Encounter for [employer] annual wellness exam   [place] [facility] [place], Marzella Schlein, MD   1 year ago Essential (primary) hypertension   [place] [facility] [place], Marzella Schlein, MD   1 year ago Essential (primary) hypertension   [place] [facility] [place], Marzella Schlein, MD       Future Appointments             In 6 days Zuleta, Joan Mayans, NP [place]  Urogynecology at [facility]   In 3 weeks Bacigalupo, Marzella Schlein, MD [facility]

## 2023-10-20 DIAGNOSIS — G4733 Obstructive sleep apnea (adult) (pediatric): Secondary | ICD-10-CM | POA: Diagnosis not present

## 2023-10-24 ENCOUNTER — Ambulatory Visit: Payer: Medicare Other | Admitting: Obstetrics and Gynecology

## 2023-10-24 VITALS — BP 130/76 | HR 75

## 2023-10-24 DIAGNOSIS — N3281 Overactive bladder: Secondary | ICD-10-CM

## 2023-10-24 DIAGNOSIS — N952 Postmenopausal atrophic vaginitis: Secondary | ICD-10-CM

## 2023-10-24 DIAGNOSIS — N816 Rectocele: Secondary | ICD-10-CM

## 2023-10-24 DIAGNOSIS — N812 Incomplete uterovaginal prolapse: Secondary | ICD-10-CM

## 2023-10-24 MED ORDER — VIBEGRON 75 MG PO TABS: 75.0000 mg | ORAL_TABLET | Freq: Every day | ORAL | Status: DC

## 2023-10-24 NOTE — Progress Notes (Signed)
[facility]   Subjective:     Chief Complaint:  Chief Complaint  Patient presents with   Pessary Check    MCKYNLEIGH RIM is a 82 y.o. female is here for pessary check.   History of Present Illness: YANELIZ GIAMMONA is a 82 y.o. female with stage II pelvic organ prolapse who presents for a pessary check. She is using a size #4 incontinence dish pessary. The pessary has been working well and she has no complaints. She is using vaginal estrogen. She endorses mild vaginal bleeding.  Past Medical History: Patient  has a past medical history of Environmental allergies, GERD (gastroesophageal reflux disease), Hyperlipidemia, and Hypertension.   Past Surgical History: She  has a past surgical history that includes Tonsillectomy; Colonoscopy; Colonoscopy with propofol (N/A, 11/27/2015); Colonoscopy with propofol (N/A, 05/18/2021); Esophagogastroduodenoscopy (egd) with propofol (N/A, 05/18/2021); polypectomy (N/A, 05/18/2021); and Colonoscopy with propofol (N/A, 08/16/2022).   Medications: She has a current medication list which includes the following prescription(s): atorvastatin, calcium carbonate, cetirizine, vitamin d, conjugated estrogens, fluticasone, hydrochlorothiazide, lactobacillus, pantoprazole, and vibegron.   Allergies: Patient has No Known Allergies.   Social History: Patient  reports that she has never smoked. She has never used smokeless tobacco. She reports that she does not drink alcohol and does not use drugs.      Objective:    Physical Exam: BP 130/76   Pulse 75  Gen: No apparent distress, A&O x 3. Detailed Urogynecologic Evaluation:  Pelvic Exam: Normal external female genitalia; Bartholin's and Skene's glands normal in appearance; urethral meatus normal in appearance, no urethral masses or discharge. The pessary was noted to be in place. It was removed and cleaned. Speculum exam revealed mild excoriation in the vagina. The patient reports she would like  to do a pessary holiday at this time. Pessary was cleaned and placed in a bag and given back to patient.    Assessment/Plan:    Assessment: Ms. Paredez is a 82 y.o. with stage II pelvic organ prolapse here for a pessary check. She is doing well.  Plan: She will let us know if or when she wants the pessary replaced. She is currently doing a pessary holiday. We spend time today discussing posterior vaginal prolapse and what this means for her pelvic floor. We also discussed surgical repair but she is not interested in that at this time.   We discussed getting a squatty potty and using that to reduce straining and struggling during bowel movements.   We also discussed her overactive bladder. Will start patient on Gemtesa 75mg  daily and see if this is helpful for patient.   Patient to return/call when she is ready for pessary re-insertion or wants to discuss other options.

## 2023-10-25 ENCOUNTER — Ambulatory Visit: Payer: Medicare Other | Admitting: Obstetrics and Gynecology

## 2023-11-01 DIAGNOSIS — G4733 Obstructive sleep apnea (adult) (pediatric): Secondary | ICD-10-CM | POA: Diagnosis not present

## 2023-11-11 ENCOUNTER — Encounter: Payer: Medicare Other | Admitting: Family Medicine

## 2023-11-14 ENCOUNTER — Ambulatory Visit (INDEPENDENT_AMBULATORY_CARE_PROVIDER_SITE_OTHER): Payer: Medicare Other | Admitting: Family Medicine

## 2023-11-14 ENCOUNTER — Encounter: Payer: Self-pay | Admitting: Family Medicine

## 2023-11-14 VITALS — BP 137/79 | HR 81 | Resp 16 | Ht <= 58 in | Wt 214.0 lb

## 2023-11-14 DIAGNOSIS — R7303 Prediabetes: Secondary | ICD-10-CM | POA: Diagnosis not present

## 2023-11-14 DIAGNOSIS — K2100 Gastro-esophageal reflux disease with esophagitis, without bleeding: Secondary | ICD-10-CM

## 2023-11-14 DIAGNOSIS — Z0000 Encounter for general adult medical examination without abnormal findings: Secondary | ICD-10-CM | POA: Diagnosis not present

## 2023-11-14 DIAGNOSIS — I10 Essential (primary) hypertension: Secondary | ICD-10-CM | POA: Diagnosis not present

## 2023-11-14 DIAGNOSIS — N3281 Overactive bladder: Secondary | ICD-10-CM | POA: Diagnosis not present

## 2023-11-14 DIAGNOSIS — E559 Vitamin D deficiency, unspecified: Secondary | ICD-10-CM

## 2023-11-14 DIAGNOSIS — E7800 Pure hypercholesterolemia, unspecified: Secondary | ICD-10-CM

## 2023-11-14 MED ORDER — PANTOPRAZOLE SODIUM 40 MG PO TBEC: 40.0000 mg | DELAYED_RELEASE_TABLET | Freq: Every day | ORAL | 1 refills | Status: DC

## 2023-11-14 NOTE — Assessment & Plan Note (Signed)
Well-controlled on hydrochlorothiazide 25 mg daily. Blood pressure is within normal limits today. - Continue hydrochlorothiazide 25 mg daily

## 2023-11-14 NOTE — Assessment & Plan Note (Signed)
Discussed importance of healthy weight management Discussed diet and exercise  

## 2023-11-14 NOTE — Assessment & Plan Note (Signed)
Started Gemtesa a week ago but does not feel it is effective yet. Advised to continue for 2-4 weeks to assess efficacy. Improvement may take up to 4 weeks. - Continue Gemtesa for 2-4 weeks and reassess efficacy - per [employer]

## 2023-11-14 NOTE — Assessment & Plan Note (Signed)
Recurrent GERD symptoms after running out of pantoprazole. Previously well-controlled on pantoprazole 40 mg daily. Symptoms returned when taking the medication every other day. Discussed resuming daily pantoprazole to control symptoms and avoid complications such as esophagitis. - Prescribe pantoprazole 40 mg daily

## 2023-11-14 NOTE — Progress Notes (Signed)
Complete physical exam  Patient: Carrie Wheeler   DOB: 1941/01/21   82 y.o. Female  MRN: [id number]  Subjective:    Chief Complaint  Patient presents with   Annual Exam    Carrie Wheeler is a 82 y.o. female who presents today for a complete physical exam. She reports consuming a general diet.  She generally feels well. She reports sleeping well. She does not have additional problems to discuss today.    Discussed the use of AI scribe software for clinical note transcription with the patient, who gave verbal consent to proceed.  History of Present Illness   The patient, with a history of hypertension, hyperlipidemia, and prediabetes, presents for a routine physical. They report that they have run out of their pantoprazole, a medication prescribed by their gastroenterologist for reflux, and have started experiencing symptoms again. They request that the primary care provider prescribe this medication moving forward. They were previously taking 40mg  daily and had good symptom control. They had attempted to decrease the frequency of dosing to every other day, but this resulted in a return of symptoms.  They are also currently trialing Gemtesa for incontinence, but do not believe it is effective after one week of use. They have been advised to continue the medication for at least two to four weeks before determining its efficacy.  The patient recently retired and reports an improvement in their leg strength and walking ability since they are no longer sitting all day. They have declined further mammograms and colonoscopies for cancer screening, citing their age as a factor in this decision. They have completed their pneumonia vaccinations and received their annual flu shot. They are aware of the availability of a COVID booster shot. They have previously considered the shingles vaccine, but had concerns about cost and availability.  The patient is fasting in preparation for lab work, which will  include an A1c for prediabetes, cholesterol levels, kidney and liver function, and vitamin D levels.       Most recent fall risk assessment:    10/04/2023   11:14 AM  Fall Risk   Falls in the past year? 0  Number falls in past yr: 0  Injury with Fall? 0  Risk for fall due to : No Fall Risks  Follow up Falls prevention discussed;Falls evaluation completed     Most recent depression screenings:    10/04/2023   11:12 AM 08/25/2023    8:23 AM  PHQ 2/9 Scores  PHQ - 2 Score 0 0  PHQ- 9 Score 0 1        Patient Care Team: Erasmo Downer, MD as PCP - General (Family Medicine) Ovidio Kin, DO as Referring Physician (Chiropractic Medicine) Dingeldein, Viviann Spare, MD (Ophthalmology)   Outpatient Medications Prior to Visit  Medication Sig   atorvastatin (LIPITOR) 40 MG tablet TAKE 1 TABLET(40 MG) BY MOUTH EVERY OTHER DAY   calcium carbonate (OS-CAL) 600 MG TABS tablet Take 1 tablet by mouth daily.   cetirizine (ZYRTEC) 10 MG tablet Take 1 tablet by mouth daily. As needed   Cholecalciferol (VITAMIN D) 2000 UNITS CAPS Take by mouth daily.    conjugated estrogens (PREMARIN) vaginal cream Place 1 Applicatorful vaginally 2 (two) times a week. Place 0.5g nightly for two weeks then twice a week after   fluticasone (FLONASE) 50 MCG/ACT nasal spray SHAKE LIQUID AND USE 2 SPRAYS IN EACH NOSTRIL EVERY DAY   hydrochlorothiazide (HYDRODIURIL) 25 MG tablet TAKE 1 TABLET(25 MG) BY MOUTH  DAILY   Lactobacillus (PROBIOTIC ACIDOPHILUS PO) Take 1 tablet by mouth daily.   [DISCONTINUED] pantoprazole (PROTONIX) 40 MG tablet Take 1 tablet (40 mg total) by mouth daily. SCHEDULE OFFICE VISIT   Vibegron 75 MG TABS Take 1 tablet (75 mg total) by mouth daily.   No facility-administered medications prior to visit.    ROS     Objective:     BP 137/79 (BP Location: Left Arm, Patient Position: Sitting, Cuff Size: Large)   Pulse 81   Resp 16   Ht 4\' 10"  (1.473 m)   Wt 214 lb (97.1 kg)   BMI  44.73 kg/m    Physical Exam Vitals reviewed.  Constitutional:      General: She is not in acute distress.    Appearance: Normal appearance. She is well-developed. She is not diaphoretic.  HENT:     Head: Normocephalic and atraumatic.     Right Ear: Tympanic membrane, ear canal and external ear normal.     Left Ear: Tympanic membrane, ear canal and external ear normal.     Nose: Nose normal.     Mouth/Throat:     Mouth: Mucous membranes are moist.     Pharynx: Oropharynx is clear. No oropharyngeal exudate.  Eyes:     General: No scleral icterus.    Conjunctiva/sclera: Conjunctivae normal.     Pupils: Pupils are equal, round, and reactive to light.  Neck:     Thyroid: No thyromegaly.  Cardiovascular:     Rate and Rhythm: Normal rate and regular rhythm.     Heart sounds: Normal heart sounds. No murmur heard. Pulmonary:     Effort: Pulmonary effort is normal. No respiratory distress.     Breath sounds: Normal breath sounds. No wheezing or rales.  Abdominal:     General: There is no distension.     Palpations: Abdomen is soft.     Tenderness: There is no abdominal tenderness.  Musculoskeletal:        General: No deformity.     Cervical back: Neck supple.     Right lower leg: No edema.     Left lower leg: No edema.  Lymphadenopathy:     Cervical: No cervical adenopathy.  Skin:    General: Skin is warm and dry.     Findings: No rash.  Neurological:     Mental Status: She is alert and oriented to person, place, and time. Mental status is at baseline.     Gait: Gait normal.  Psychiatric:        Mood and Affect: Mood normal.        Behavior: Behavior normal.        Thought Content: Thought content normal.      No results found for any visits on 11/14/23.     Assessment & Plan:    Routine Health Maintenance and Physical Exam  Immunization History  Administered Date(s) Administered   Fluad Quad(high Dose 65+) 10/20/2021, 11/09/2022   Fluad Trivalent(High Dose 65+)  08/25/2023   Influenza, High Dose Seasonal PF 09/12/2017, 09/18/2018, 10/03/2019, 09/16/2020   PFIZER(Purple Top)SARS-COV-2 Vaccination 01/30/2020, 02/20/2020, 10/14/2020   Pneumococcal Conjugate-13 05/19/2015   Pneumococcal Polysaccharide-23 04/02/2011   Td 06/01/2006, 08/30/2016   Tdap 06/01/2006    Health Maintenance  Topic Date Due   Zoster Vaccines- Shingrix (1 of 2) Never done   COVID-19 Vaccine (4 - 2023-24 season) 08/07/2023   MAMMOGRAM  10/06/2023   Medicare Annual Wellness (AWV)  10/03/2024   DTaP/Tdap/Td (4 -  Td or Tdap) 08/30/2026   Pneumonia Vaccine 14+ Years old  Completed   INFLUENZA VACCINE  Completed   DEXA SCAN  Completed   HPV VACCINES  Aged Out   Colonoscopy  Discontinued    Discussed health benefits of physical activity, and encouraged her to engage in regular exercise appropriate for her age and condition.  Problem List Items Addressed This Visit       Cardiovascular and Mediastinum   Essential (primary) hypertension    Well-controlled on hydrochlorothiazide 25 mg daily. Blood pressure is within normal limits today. - Continue hydrochlorothiazide 25 mg daily      Relevant Orders   Comprehensive metabolic panel     Digestive   GERD (gastroesophageal reflux disease)    Recurrent GERD symptoms after running out of pantoprazole. Previously well-controlled on pantoprazole 40 mg daily. Symptoms returned when taking the medication every other day. Discussed resuming daily pantoprazole to control symptoms and avoid complications such as esophagitis. - Prescribe pantoprazole 40 mg daily      Relevant Medications   pantoprazole (PROTONIX) 40 MG tablet     Genitourinary   OAB (overactive bladder)    Started Gemtesa a week ago but does not feel it is effective yet. Advised to continue for 2-4 weeks to assess efficacy. Improvement may take up to 4 weeks. - Continue Gemtesa for 2-4 weeks and reassess efficacy - per UroGyn        Other   Hyperlipidemia     Well-controlled on atorvastatin 40 mg daily. - Continue atorvastatin 40 mg daily      Relevant Orders   Comprehensive metabolic panel   Lipid panel   Morbid obesity (HCC)    Discussed importance of healthy weight management Discussed diet and exercise       Avitaminosis D    Continue supplement Recheck level       Relevant Orders   VITAMIN D 25 Hydroxy (Vit-D Deficiency, Fractures)   Prediabetes    Recommend low carb diet Recheck A1c       Relevant Orders   Hemoglobin A1c   Other Visit Diagnoses     Encounter for annual physical exam    -  Primary   Relevant Orders   Hemoglobin A1c   Comprehensive metabolic panel   Lipid panel   VITAMIN D 25 Hydroxy (Vit-D Deficiency, Fractures)           General Health Maintenance Up to date on pneumonia and flu vaccinations. Declined mammogram and shingles vaccination.  Labs for A1c, cholesterol, kidney and liver function, and vitamin D are due. - Order labs for A1c, cholesterol, kidney and liver function, and vitamin D - Discuss shingles vaccination after Christmas - No mammogram as per preference  Follow-up - Schedule follow-up in 6 months for blood pressure and general health maintenance.       Return in about 6 months (around 05/14/2024) for chronic disease f/u.     Shirlee Latch, MD

## 2023-11-14 NOTE — Assessment & Plan Note (Signed)
Recommend low carb diet °Recheck A1c  °

## 2023-11-14 NOTE — Assessment & Plan Note (Signed)
Well-controlled on atorvastatin 40mg  daily. -Continue atorvastatin 40mg  daily.

## 2023-11-14 NOTE — Assessment & Plan Note (Signed)
Continue supplement Recheck level 

## 2023-11-15 LAB — HEMOGLOBIN A1C
Est. average glucose Bld gHb Est-mCnc: 123 mg/dL
Hgb A1c MFr Bld: 5.9 % — ABNORMAL HIGH (ref 4.8–5.6)

## 2023-11-15 LAB — LIPID PANEL
Chol/HDL Ratio: 3.2 {ratio} (ref 0.0–4.4)
Cholesterol, Total: 171 mg/dL (ref 100–199)
HDL: 54 mg/dL (ref >39)
LDL Chol Calc (NIH): 102 mg/dL — ABNORMAL HIGH (ref 0–99)
Triglycerides: 82 mg/dL (ref 0–149)
VLDL Cholesterol Cal: 15 mg/dL (ref 5–40)

## 2023-11-15 LAB — COMPREHENSIVE METABOLIC PANEL
ALT: 16 [IU]/L (ref 0–32)
AST: 18 [IU]/L (ref 0–40)
Albumin: 4.1 g/dL (ref 3.8–4.8)
Alkaline Phosphatase: 152 [IU]/L — ABNORMAL HIGH (ref 44–121)
BUN/Creatinine Ratio: 15 (ref 12–28)
BUN: 15 mg/dL (ref 8–27)
Bilirubin Total: 0.7 mg/dL (ref 0.0–1.2)
CO2: 28 mmol/L (ref 20–29)
Calcium: 9.4 mg/dL (ref 8.7–10.3)
Chloride: 101 mmol/L (ref 96–106)
Creatinine, Ser: 1.02 mg/dL — ABNORMAL HIGH (ref 0.57–1.00)
Globulin, Total: 2.9 g/dL (ref 1.5–4.5)
Glucose: 86 mg/dL (ref 70–99)
Potassium: 4.5 mmol/L (ref 3.5–5.2)
Sodium: 143 mmol/L (ref 134–144)
Total Protein: 7 g/dL (ref 6.0–8.5)
eGFR: 56 mL/min/{1.73_m2} — ABNORMAL LOW (ref >59)

## 2023-11-15 LAB — VITAMIN D 25 HYDROXY (VIT D DEFICIENCY, FRACTURES): Vit D, 25-Hydroxy: 28.7 ng/mL — ABNORMAL LOW (ref 30.0–100.0)

## 2023-11-16 ENCOUNTER — Encounter: Payer: Self-pay | Admitting: Family Medicine

## 2023-11-23 ENCOUNTER — Telehealth: Payer: Self-pay | Admitting: Obstetrics and Gynecology

## 2023-11-23 DIAGNOSIS — N3281 Overactive bladder: Secondary | ICD-10-CM

## 2023-11-23 DIAGNOSIS — N816 Rectocele: Secondary | ICD-10-CM

## 2023-11-23 NOTE — Telephone Encounter (Signed)
Called and spoke to patient on the phone.  Patient reports that the Gemtesa 75 mg daily has not made a difference in her nocturia.  We discussed other options for treatment including bladder Botox, SNM, PTNS.  Patient reports that she feels she does not want to pursue another option at this time.  She states that she has officially retired and is now getting more rest even though she is not sleeping through the night and still has urgency and frequency at nighttime.  Patient reports that her CPAP is also a factor as she can tolerate it for 3 to 4 hours and then has issues.  Patient is also still on a pessary holiday and reports that as of right now the prolapse is not bothering her and it has not began to come out of the vaginal opening.  I encouraged her to let us know if she needs assistance and to call if she has any concerns or would like to pursue other treatment options.

## 2023-12-01 DIAGNOSIS — G4733 Obstructive sleep apnea (adult) (pediatric): Secondary | ICD-10-CM | POA: Diagnosis not present

## 2023-12-21 ENCOUNTER — Telehealth: Payer: Self-pay | Admitting: Family Medicine

## 2023-12-21 DIAGNOSIS — E7800 Pure hypercholesterolemia, unspecified: Secondary | ICD-10-CM

## 2023-12-21 MED ORDER — ATORVASTATIN CALCIUM 40 MG PO TABS: 40.0000 mg | ORAL_TABLET | Freq: Every day | ORAL | 1 refills | Status: DC

## 2023-12-21 NOTE — Telephone Encounter (Signed)
 [facility] pharmacy is requesting prescription refill atorvastatin  (LIPITOR) 40 MG tablet  Please advise

## 2024-01-01 DIAGNOSIS — G4733 Obstructive sleep apnea (adult) (pediatric): Secondary | ICD-10-CM | POA: Diagnosis not present

## 2024-01-06 ENCOUNTER — Telehealth: Payer: Self-pay | Admitting: Family Medicine

## 2024-01-06 ENCOUNTER — Other Ambulatory Visit: Payer: Self-pay | Admitting: Family Medicine

## 2024-01-06 DIAGNOSIS — E7800 Pure hypercholesterolemia, unspecified: Secondary | ICD-10-CM

## 2024-01-06 NOTE — Telephone Encounter (Signed)
E-Prescribing Status: Receipt confirmed by pharmacy (12/21/2023  3:43 PM EST)  LRF 12/21/23 #[id number]

## 2024-01-06 NOTE — Telephone Encounter (Signed)
[facility] pharmacy is requesting refill atorvastatin (LIPITOR) 40 MG tablet   Please advise

## 2024-01-06 NOTE — Telephone Encounter (Signed)
Resend to [facility].

## 2024-01-06 NOTE — Telephone Encounter (Signed)
Patient is calling to request medication to please be resent. Pharmacy did not receive the request   Medication Refill - Most Recent Primary Care Visit:  Provider: Erasmo Downer  Department: [facility]  Visit Type: PHYSICAL  Date: 11/14/2023  Medication: atorvastatin (LIPITOR) 40 MG tablet [[id number]]   Has the patient contacted their pharmacy? Yes (Agent: If no, request that the patient contact the pharmacy for the refill. If patient does not wish to contact the pharmacy document the reason why and proceed with request.) (Agent: If yes, when and what did the pharmacy advise?)  Is this the correct pharmacy for this prescription? Yes If no, delete pharmacy and type the correct one.  This is the patient's preferred pharmacy:  [id number] [place], [place] - [address] AT [facility] OF [address] [place] [address] Phone: [phone] Fax: [phone]   Has the prescription been filled recently? Yes  Is the patient out of the medication? Yes  Has the patient been seen for an appointment in the last year OR does the patient have an upcoming appointment? Yes  Can we respond through MyChart? Yes  Agent: Please be advised that Rx refills may take up to 3 business days. We ask that you follow-up with your pharmacy.

## 2024-01-09 ENCOUNTER — Other Ambulatory Visit: Payer: Self-pay | Admitting: Family Medicine

## 2024-01-09 DIAGNOSIS — E7800 Pure hypercholesterolemia, unspecified: Secondary | ICD-10-CM

## 2024-01-09 MED ORDER — ATORVASTATIN CALCIUM 40 MG PO TABS: 40.0000 mg | ORAL_TABLET | Freq: Every day | ORAL | 1 refills | Status: DC

## 2024-01-09 NOTE — Telephone Encounter (Signed)
Requested Prescriptions  Pending Prescriptions Disp Refills   atorvastatin (LIPITOR) 40 MG tablet 45 tablet 1    Sig: Take 1 tablet (40 mg total) by mouth daily. TAKE 1 TABLET(40 MG) BY MOUTH EVERY OTHER DAY     Cardiovascular:  Antilipid - Statins Failed - 01/09/2024  9:34 AM      Failed - Lipid Panel in normal range within the last 12 months    Cholesterol, Total  Date Value Ref Range Status  11/14/2023 171 100 - 199 mg/dL Final   LDL Chol Calc (NIH)  Date Value Ref Range Status  11/14/2023 102 (H) 0 - 99 mg/dL Final   HDL  Date Value Ref Range Status  11/14/2023 54 >39 mg/dL Final   Triglycerides  Date Value Ref Range Status  11/14/2023 82 0 - 149 mg/dL Final         Passed - Patient is not pregnant      Passed - Valid encounter within last 12 months    Recent Outpatient Visits           1 month ago Encounter for annual physical exam   [facility] [place], Marzella Schlein, MD   4 months ago Essential (primary) hypertension   [place] [facility] [place], Marzella Schlein, MD   8 months ago Essential (primary) hypertension   [place] [facility] [place], Marzella Schlein, MD   1 year ago Encounter for Medicare annual wellness exam   [place] [facility] [place], Marzella Schlein, MD   1 year ago Essential (primary) hypertension   [place] [facility] [place], Marzella Schlein, MD       Future Appointments             In 4 months Bacigalupo, Marzella Schlein, MD [facility]

## 2024-01-09 NOTE — Telephone Encounter (Signed)
Kathlene November from [facility] pharmacy stated 12/21/2023  atorvastatin (LIPITOR) 40 MG tablet prescription was received but no refills. Requesting a new rx. Caller would like a 90 day supply sent in to the pharmacy today because she is out.  Medication refill request have been sent in on 1/15, twice on 1/31.   [facility] #[id number] [place], [place] - [address] AT [facility] OF [address] Phone: [phone]  Fax: [phone]

## 2024-01-09 NOTE — Telephone Encounter (Signed)
[facility] called and spoke to Carrie Wheeler, He says he did not receive the Rx sent on 12/21/23 at all. Advised I will resend.

## 2024-01-17 DIAGNOSIS — G4733 Obstructive sleep apnea (adult) (pediatric): Secondary | ICD-10-CM | POA: Diagnosis not present

## 2024-01-18 ENCOUNTER — Other Ambulatory Visit: Payer: Self-pay | Admitting: Family Medicine

## 2024-01-18 NOTE — Telephone Encounter (Signed)
Medication Refill -  Most Recent Primary Care Visit:  Provider: Erasmo Downer  Department: [id number]  Visit Type: PHYSICAL  Date: 11/14/2023  Medication: hydrochlorothiazide (HYDRODIURIL) 25 MG tablet   Has the patient contacted their pharmacy? no (Pt thought she had to call in since says no refills  Is this the correct pharmacy for this prescription? yes This is the patient's preferred pharmacy:  [facility] #[id number] [place], [place] - [address] AT [facility] OF [address] [place] [address] Phone: [phone] Fax: [phone]   Has the prescription been filled recently? no  Is the patient out of the medication? Has enough for a week  Has the patient been seen for an appointment in the last year OR does the patient have an upcoming appointment? yes  Can we respond through MyChart? yes  Agent: Please be advised that Rx refills may take up to 3 business days. We ask that you follow-up with your pharmacy.

## 2024-01-19 MED ORDER — HYDROCHLOROTHIAZIDE 25 MG PO TABS: 25.0000 mg | ORAL_TABLET | Freq: Every day | ORAL | 0 refills | Status: DC

## 2024-01-19 NOTE — Telephone Encounter (Signed)
Requested Prescriptions  Pending Prescriptions Disp Refills   hydrochlorothiazide (HYDRODIURIL) 25 MG tablet 90 tablet 0    Sig: Take 1 tablet (25 mg total) by mouth daily. TAKE 1 TABLET(25 MG) BY MOUTH DAILY     Cardiovascular: Diuretics - Thiazide Failed - 01/19/2024 12:42 PM      Failed - Cr in normal range and within 180 days    Creatinine  Date Value Ref Range Status  03/02/2015 0.67 mg/dL Final    Comment:    1.61-0.96 NOTE: New Reference Range  02/11/15    Creatinine, Ser  Date Value Ref Range Status  11/14/2023 1.02 (H) 0.57 - 1.00 mg/dL Final         Passed - K in normal range and within 180 days    Potassium  Date Value Ref Range Status  11/14/2023 4.5 3.5 - 5.2 mmol/L Final  03/02/2015 4.3 mmol/L Final    Comment:    3.5-5.1 NOTE: New Reference Range  02/11/15          Passed - Na in normal range and within 180 days    Sodium  Date Value Ref Range Status  11/14/2023 143 134 - 144 mmol/L Final  03/02/2015 139 mmol/L Final    Comment:    135-145 NOTE: New Reference Range  02/11/15          Passed - Last BP in normal range    BP Readings from Last 1 Encounters:  11/14/23 137/79         Passed - Valid encounter within last 6 months    Recent Outpatient Visits           2 months ago Encounter for annual physical exam   [place] [facility] [place], Marzella Schlein, MD   4 months ago Essential (primary) hypertension   [place] [facility] [place], Marzella Schlein, MD   8 months ago Essential (primary) hypertension   [place] [facility] [place], Marzella Schlein, MD   1 year ago Encounter for Medicare annual wellness exam   [place] [facility] [place], Marzella Schlein, MD   1 year ago Essential (primary) hypertension    [place] [place], Marzella Schlein, MD       Future Appointments             In 3 months Bacigalupo, Marzella Schlein, MD [facility]

## 2024-02-01 DIAGNOSIS — G4733 Obstructive sleep apnea (adult) (pediatric): Secondary | ICD-10-CM | POA: Diagnosis not present

## 2024-02-29 DIAGNOSIS — G4733 Obstructive sleep apnea (adult) (pediatric): Secondary | ICD-10-CM | POA: Diagnosis not present

## 2024-04-13 DIAGNOSIS — G4733 Obstructive sleep apnea (adult) (pediatric): Secondary | ICD-10-CM | POA: Diagnosis not present

## 2024-04-22 ENCOUNTER — Other Ambulatory Visit: Payer: Self-pay | Admitting: Family Medicine

## 2024-05-14 ENCOUNTER — Ambulatory Visit
Admission: RE | Admit: 2024-05-14 | Discharge: 2024-05-14 | Disposition: A | Discharge status: Home / Self Care | Source: Ambulatory Visit | Attending: Family Medicine | Admitting: Family Medicine

## 2024-05-14 ENCOUNTER — Encounter: Payer: Self-pay | Admitting: Family Medicine

## 2024-05-14 ENCOUNTER — Ambulatory Visit
Admission: RE | Admit: 2024-05-14 | Discharge: 2024-05-14 | Disposition: A | Discharge status: Home / Self Care | Attending: Family Medicine | Admitting: Family Medicine

## 2024-05-14 ENCOUNTER — Ambulatory Visit (INDEPENDENT_AMBULATORY_CARE_PROVIDER_SITE_OTHER): Payer: Self-pay | Admitting: Family Medicine

## 2024-05-14 VITALS — BP 136/80 | HR 74 | Ht <= 58 in | Wt 205.9 lb

## 2024-05-14 DIAGNOSIS — M791 Myalgia, unspecified site: Secondary | ICD-10-CM

## 2024-05-14 DIAGNOSIS — J302 Other seasonal allergic rhinitis: Secondary | ICD-10-CM | POA: Diagnosis not present

## 2024-05-14 DIAGNOSIS — M25512 Pain in left shoulder: Secondary | ICD-10-CM | POA: Diagnosis not present

## 2024-05-14 DIAGNOSIS — Z043 Encounter for examination and observation following other accident: Secondary | ICD-10-CM | POA: Diagnosis not present

## 2024-05-14 DIAGNOSIS — W19XXXA Unspecified fall, initial encounter: Secondary | ICD-10-CM | POA: Diagnosis not present

## 2024-05-14 DIAGNOSIS — R7303 Prediabetes: Secondary | ICD-10-CM

## 2024-05-14 DIAGNOSIS — N1831 Chronic kidney disease, stage 3a: Secondary | ICD-10-CM

## 2024-05-14 DIAGNOSIS — T466X5A Adverse effect of antihyperlipidemic and antiarteriosclerotic drugs, initial encounter: Secondary | ICD-10-CM | POA: Diagnosis not present

## 2024-05-14 DIAGNOSIS — M47814 Spondylosis without myelopathy or radiculopathy, thoracic region: Secondary | ICD-10-CM | POA: Diagnosis not present

## 2024-05-14 DIAGNOSIS — E7800 Pure hypercholesterolemia, unspecified: Secondary | ICD-10-CM

## 2024-05-14 DIAGNOSIS — E559 Vitamin D deficiency, unspecified: Secondary | ICD-10-CM | POA: Diagnosis not present

## 2024-05-14 DIAGNOSIS — I10 Essential (primary) hypertension: Secondary | ICD-10-CM

## 2024-05-14 DIAGNOSIS — K2100 Gastro-esophageal reflux disease with esophagitis, without bleeding: Secondary | ICD-10-CM

## 2024-05-14 DIAGNOSIS — R079 Chest pain, unspecified: Secondary | ICD-10-CM | POA: Diagnosis not present

## 2024-05-14 MED ORDER — HYDROCHLOROTHIAZIDE 25 MG PO TABS: 25.0000 mg | ORAL_TABLET | Freq: Every day | ORAL | 1 refills | Status: AC

## 2024-05-14 MED ORDER — ATORVASTATIN CALCIUM 40 MG PO TABS
40.0000 mg | ORAL_TABLET | ORAL | 1 refills | Status: DC
Start: 2024-05-14 — End: 2024-07-06

## 2024-05-14 MED ORDER — FLUTICASONE PROPIONATE 50 MCG/ACT NA SUSP: NASAL | 11 refills | Status: AC

## 2024-05-14 MED ORDER — PANTOPRAZOLE SODIUM 20 MG PO TBEC: 20.0000 mg | DELAYED_RELEASE_TABLET | ORAL | 2 refills | Status: DC

## 2024-05-14 NOTE — Progress Notes (Unsigned)
 Established patient visit   Patient: Carrie Wheeler   DOB: 27-Dec-1940   83 y.o. Female  MRN: [id number] Visit Date: 05/14/2024  Today's healthcare provider: Aden Agreste, MD   Chief Complaint  Patient presents with  . Medical Management of Chronic Issues  . Hypertension    Patient reports she monitors on occasions. Reports symptoms of dizziness at times.   . Hyperlipidemia    No symptoms to report  . Medication Consultation    Would like to see if she can cut back on reflux medication and then eventually come off of it   . Shoulder Pain    Ongoing right shoulder pain from previous fall   Subjective    Hypertension  Hyperlipidemia  Shoulder Pain   HPI     Hypertension    Additional comments: Patient reports she monitors on occasions. Reports symptoms of dizziness at times.         Hyperlipidemia    Additional comments: No symptoms to report        Medication Consultation    Additional comments: Would like to see if she can cut back on reflux medication and then eventually come off of it         Shoulder Pain    Additional comments: Ongoing right shoulder pain from previous fall      Last edited by Pasty Bongo, CMA on 05/14/2024  3:39 PM.       Discussed the use of AI scribe software for clinical note transcription with the patient, who gave verbal consent to proceed.  History of Present Illness   Carrie ISHLER "June" is an 83 year old female who presents for a follow-up on blood pressure, cholesterol, left shoulder pain, and reflux medication management.  She manages her blood pressure with hydrochlorothiazide  25 mg daily, with stable readings at home.  For cholesterol, she takes atorvastatin  40 mg every other day and adheres to this regimen.  Reflux is controlled with pantoprazole  40 mg every other day, with occasional flare-ups managed by dietary adjustments. No significant increase in symptoms.  Left shoulder pain persists  following a fall two months ago, localized around the spine of the scapula, worsened by overhead movements and lying on her side. Topical anti-inflammatory treatments provide relief.  Discomfort in the mid-back area from a past fall affecting her ribs two to three months ago causes discomfort when taking deep breaths. No x-ray was performed at the time of the fall.         Medications: Outpatient Medications Prior to Visit  Medication Sig  . Biotin 40981 MCG TABS Take by mouth.  . calcium  carbonate (OS-CAL) 600 MG TABS tablet Take 1 tablet by mouth daily.  . cetirizine (ZYRTEC) 10 MG tablet Take 1 tablet by mouth daily. As needed  . Cholecalciferol (VITAMIN D ) 2000 UNITS CAPS Take by mouth daily.   . Lactobacillus (PROBIOTIC ACIDOPHILUS PO) Take 1 tablet by mouth daily.  . Omega-3 Fatty Acids (FISH OIL) 1000 MG CPDR Take by mouth in the morning and at bedtime.  . Turmeric (QC TUMERIC COMPLEX) 500 MG CAPS Take by mouth.  . [DISCONTINUED] atorvastatin  (LIPITOR) 40 MG tablet Take 1 tablet (40 mg total) by mouth daily. TAKE 1 TABLET(40 MG) BY MOUTH EVERY OTHER DAY  . [DISCONTINUED] fluticasone  (FLONASE ) 50 MCG/ACT nasal spray SHAKE LIQUID AND USE 2 SPRAYS IN EACH NOSTRIL EVERY DAY  . [DISCONTINUED] hydrochlorothiazide  (HYDRODIURIL ) 25 MG tablet TAKE 1 TABLET(25 MG) BY MOUTH DAILY  . [  DISCONTINUED] pantoprazole  (PROTONIX ) 40 MG tablet Take 1 tablet (40 mg total) by mouth daily.  Aaron Aas conjugated estrogens  (PREMARIN ) vaginal cream Place 1 Applicatorful vaginally 2 (two) times a week. Place 0.5g nightly for two weeks then twice a week after (Patient not taking: Reported on 05/14/2024)  . [DISCONTINUED] Vibegron  75 MG TABS Take 1 tablet (75 mg total) by mouth daily. (Patient not taking: Reported on 05/14/2024)   No facility-administered medications prior to visit.    Review of Systems {Insert previous labs (optional):23779} {See past labs  Heme  Chem  Endocrine  Serology  Results Review  (optional):1}   Objective    BP 136/80 (BP Location: Left Arm, Patient Position: Sitting, Cuff Size: Large)   Pulse 74   Ht 4\' 10"  (1.473 m)   Wt 205 lb 14.4 oz (93.4 kg)   SpO2 97%   BMI 43.03 kg/m  {Insert last BP/Wt (optional):23777}{See vitals history (optional):1}  Physical Exam Vitals reviewed.  Constitutional:      General: She is not in acute distress.    Appearance: Normal appearance. She is well-developed. She is not diaphoretic.  HENT:     Head: Normocephalic and atraumatic.  Eyes:     General: No scleral icterus.    Conjunctiva/sclera: Conjunctivae normal.  Neck:     Thyroid : No thyromegaly.  Cardiovascular:     Rate and Rhythm: Normal rate and regular rhythm.     Heart sounds: Normal heart sounds. No murmur heard. Pulmonary:     Effort: Pulmonary effort is normal. No respiratory distress.     Breath sounds: Normal breath sounds. No wheezing, rhonchi or rales.  Musculoskeletal:     Cervical back: Neck supple.     Right lower leg: No edema.     Left lower leg: No edema.     Comments: L Shoulder: ** Inspection reveals no abnormalities, atrophy or asymmetry. Palpation is normal with no tenderness over AC joint or bicipital groove. ROM is full in all planes. Rotator cuff strength normal throughout. No signs of impingement with negative Neer and Hawkin's tests, empty can. Speeds and Yergason's tests normal. No labral pathology noted with negative Obrien's, negative clunk and good stability. Normal scapular function observed. No painful arc and no drop arm sign. No apprehension sign   Lymphadenopathy:     Cervical: No cervical adenopathy.  Skin:    General: Skin is warm and dry.     Findings: No rash.  Neurological:     Mental Status: She is alert and oriented to person, place, and time. Mental status is at baseline.  Psychiatric:        Mood and Affect: Mood normal.        Behavior: Behavior normal.     No results found for any visits on 05/14/24.   Assessment & Plan     Problem List Items Addressed This Visit       Cardiovascular and Mediastinum   Essential (primary) hypertension - Primary   Relevant Medications   atorvastatin  (LIPITOR) 40 MG tablet   hydrochlorothiazide  (HYDRODIURIL ) 25 MG tablet   Other Relevant Orders   Comprehensive metabolic panel with GFR     Other   Hyperlipidemia   Relevant Medications   atorvastatin  (LIPITOR) 40 MG tablet   hydrochlorothiazide  (HYDRODIURIL ) 25 MG tablet   Other Relevant Orders   Comprehensive metabolic panel with GFR   Lipid panel   Avitaminosis D   Relevant Orders   VITAMIN D  25 Hydroxy (Vit-D Deficiency, Fractures)   Myalgia  due to statin   Prediabetes   Relevant Orders   Hemoglobin A1c   Other Visit Diagnoses       Other seasonal allergic rhinitis       Relevant Medications   fluticasone  (FLONASE ) 50 MCG/ACT nasal spray     Acute pain of left shoulder       Relevant Orders   DG Shoulder Left     Fall, initial encounter       Relevant Orders   DG Thoracic Spine 2 View           Left Shoulder Pain Chronic left shoulder pain following a fall 1-2 months ago, localized to the spine of the scapula and exacerbated by overhead movements. Differential includes bursitis and possible fracture. Persistent pain suggests further evaluation is warranted. - Order x-ray of left shoulder - Consider corticosteroid injection if pain persists despite topical treatment  Thoracic Spine Pain Mid-back pain following a fall 2-3 months ago, localized to the right of the spine and exacerbated by deep breaths. Differential includes vertebral injury. - Order x-ray of thoracic spine  Gastroesophageal Reflux Disease (GERD) Currently on pantoprazole  40 mg every other day with occasional flare-ups managed by dietary adjustments. Wishes to discontinue to minimize long-term risks associated with prolonged use. - Reduce pantoprazole  to 20 mg every other day for at least 2 weeks - Discontinue  pantoprazole  if symptoms are controlled after tapering  Chronic Kidney Disease, Stage 2 Slightly reduced GFR consistent with age-related changes. Monitoring due to medication use. Slight reduction in GFR is not concerning at age 76. - Continue monitoring kidney function biannually  General Health Maintenance Routine health maintenance discussed, including biannual lab work and eye examination. Labs include vitamin D , A1c, cholesterol, kidney, and liver function tests. - Perform biannual lab work - Recommend eye examination  Follow-up Routine follow-up and monitoring of current health conditions. - Schedule follow-up visit in 6 months for physical examination - Advise to return sooner if shoulder pain persists or worsens       Return in about 6 months (around 11/13/2024) for CPE.       Aden Agreste, MD  [facility] [phone] (phone) [phone] (fax)  [facility]

## 2024-05-14 NOTE — Assessment & Plan Note (Signed)
 On atorvastatin  40 mg every other day. recheck today.

## 2024-05-14 NOTE — Assessment & Plan Note (Signed)
 Blood pressure slightly elevated in office, but reports normal readings at home. Currently on hydrochlorothiazide  25 mg daily. Anxiety may contribute to elevated office readings. - Recheck blood pressure at the end of the visit

## 2024-05-14 NOTE — Assessment & Plan Note (Signed)
 Recommend low carb diet Recheck A1c

## 2024-05-15 ENCOUNTER — Ambulatory Visit: Payer: Self-pay | Admitting: Family Medicine

## 2024-05-15 DIAGNOSIS — N1831 Chronic kidney disease, stage 3a: Secondary | ICD-10-CM | POA: Insufficient documentation

## 2024-05-15 LAB — HEMOGLOBIN A1C
Est. average glucose Bld gHb Est-mCnc: 114 mg/dL
Hgb A1c MFr Bld: 5.6 % (ref 4.8–5.6)

## 2024-05-15 LAB — LIPID PANEL
Chol/HDL Ratio: 2.9 ratio (ref 0.0–4.4)
Cholesterol, Total: 149 mg/dL (ref 100–199)
HDL: 52 mg/dL (ref >39)
LDL Chol Calc (NIH): 85 mg/dL (ref 0–99)
Triglycerides: 57 mg/dL (ref 0–149)
VLDL Cholesterol Cal: 12 mg/dL (ref 5–40)

## 2024-05-15 LAB — COMPREHENSIVE METABOLIC PANEL WITH GFR
ALT: 10 IU/L (ref 0–32)
AST: 17 IU/L (ref 0–40)
Albumin: 4.1 g/dL (ref 3.8–4.8)
Alkaline Phosphatase: 125 IU/L — ABNORMAL HIGH (ref 44–121)
BUN/Creatinine Ratio: 24 (ref 12–28)
BUN: 23 mg/dL (ref 8–27)
Bilirubin Total: 0.6 mg/dL (ref 0.0–1.2)
CO2: 20 mmol/L (ref 20–29)
Calcium: 10.1 mg/dL (ref 8.7–10.3)
Chloride: 101 mmol/L (ref 96–106)
Creatinine, Ser: 0.97 mg/dL (ref 0.57–1.00)
Globulin, Total: 2.5 g/dL (ref 1.5–4.5)
Glucose: 85 mg/dL (ref 70–99)
Potassium: 4.2 mmol/L (ref 3.5–5.2)
Sodium: 141 mmol/L (ref 134–144)
Total Protein: 6.6 g/dL (ref 6.0–8.5)
eGFR: 59 mL/min/{1.73_m2} — ABNORMAL LOW (ref >59)

## 2024-05-15 LAB — VITAMIN D 25 HYDROXY (VIT D DEFICIENCY, FRACTURES): Vit D, 25-Hydroxy: 31.8 ng/mL (ref 30.0–100.0)

## 2024-05-15 NOTE — Assessment & Plan Note (Signed)
 Currently on pantoprazole  40 mg every other day with occasional flare-ups managed by dietary adjustments. Wishes to discontinue to minimize long-term risks associated with prolonged use. - Reduce pantoprazole  to 20 mg every other day for at least 2 weeks - Discontinue pantoprazole  if symptoms are controlled after tapering

## 2024-05-15 NOTE — Assessment & Plan Note (Addendum)
 Slightly reduced GFR consistent with age-related changes. Monitoring due to medication use. Slight reduction in GFR is not concerning at age 83. - Continue monitoring kidney function biannually - continue good HTN and other risk factor management - use NSAIDs sparingly

## 2024-07-06 ENCOUNTER — Other Ambulatory Visit: Payer: Self-pay | Admitting: Family Medicine

## 2024-07-06 DIAGNOSIS — E7800 Pure hypercholesterolemia, unspecified: Secondary | ICD-10-CM

## 2024-07-06 NOTE — Telephone Encounter (Signed)
 Patient was last seen on 05/14/2024.

## 2024-07-12 DIAGNOSIS — G4733 Obstructive sleep apnea (adult) (pediatric): Secondary | ICD-10-CM | POA: Diagnosis not present

## 2024-08-09 ENCOUNTER — Other Ambulatory Visit: Payer: Self-pay | Admitting: Family Medicine

## 2024-09-03 ENCOUNTER — Telehealth: Payer: Self-pay

## 2024-09-03 NOTE — Telephone Encounter (Signed)
 Patient called and advised atorvastatin  40 mg every other day is what she's supposed to be taking. She verbalized understanding and says she wanted to be sure, because she thought this was changed.   Copied from CRM [id number]. Topic: Clinical - Medication Question >> Sep 03, 2024 12:19 PM Carrie Wheeler wrote: Reason for CRM: Patient called in wanting to confirm the milligram amt that she is supposed to be taking for med, atorvastatin  (LIPITOR).

## 2024-10-09 ENCOUNTER — Ambulatory Visit: Payer: Self-pay

## 2024-11-11 ENCOUNTER — Other Ambulatory Visit: Payer: Self-pay | Admitting: Family Medicine

## 2024-11-12 ENCOUNTER — Telehealth: Payer: Self-pay | Admitting: Family Medicine

## 2024-11-12 NOTE — Telephone Encounter (Signed)
 [facility] Carrie Wheeler. Is requesting refills on Pantoprazole  20 mg. With refills

## 2024-11-13 NOTE — Telephone Encounter (Signed)
 Refilled yesterday.

## 2024-11-15 ENCOUNTER — Encounter: Admitting: Family Medicine

## 2024-11-20 ENCOUNTER — Other Ambulatory Visit: Payer: Self-pay

## 2024-11-20 DIAGNOSIS — K2100 Gastro-esophageal reflux disease with esophagitis, without bleeding: Secondary | ICD-10-CM

## 2024-11-20 MED ORDER — PANTOPRAZOLE SODIUM 20 MG PO TBEC: 20.0000 mg | DELAYED_RELEASE_TABLET | ORAL | 0 refills | Status: AC

## 2024-12-11 ENCOUNTER — Ambulatory Visit

## 2024-12-11 VITALS — BP 122/74 | Ht 61.0 in | Wt 194.9 lb

## 2024-12-11 DIAGNOSIS — Z0000 Encounter for general adult medical examination without abnormal findings: Secondary | ICD-10-CM | POA: Diagnosis not present

## 2024-12-11 NOTE — Patient Instructions (Addendum)
 Ms. Carrie Wheeler,  Thank you for taking the time for your Medicare Wellness Visit. I appreciate your continued commitment to your health goals. Please review the care plan we discussed, and feel free to reach out if I can assist you further.  Please note that Annual Wellness Visits do not include a physical exam. Some assessments may be limited, especially if the visit was conducted virtually. If needed, we may recommend an in-person follow-up with your provider.  Ongoing Care Seeing your primary care provider every 3 to 6 months helps us  monitor your health and provide consistent, personalized care. APPT FOR PHYSICAL 12/18/24 @ 2:20 PM   Referrals If a referral was made during today's visit and you haven't received any updates within two weeks, please contact the referred provider directly to check on the status.  Recommended Screenings:  Health Maintenance  Topic Date Due   Zoster (Shingles) Vaccine (1 of 2) Never done   Breast Cancer Screening  10/06/2023   COVID-19 Vaccine (4 - 2025-26 season) 08/06/2024   Medicare Annual Wellness Visit  12/11/2025   DTaP/Tdap/Td vaccine (4 - Td or Tdap) 08/30/2026   Pneumococcal Vaccine for age over 28  Completed   Flu Shot  Completed   Osteoporosis screening with Bone Density Scan  Completed   Meningitis B Vaccine  Aged Out   Colon Cancer Screening  Discontinued       12/11/2024    8:17 AM  Advanced Directives  Does Patient Have a Medical Advance Directive? Yes  Type of Estate Agent of [place];Living will  Does patient want to make changes to medical advance directive? No - Patient declined  Copy of Healthcare Power of Attorney in Chart? Yes - validated most recent copy scanned in chart (See row information)    Vision: Annual vision screenings are recommended for early detection of glaucoma, cataracts, and diabetic retinopathy. These exams can also reveal signs of chronic conditions such as diabetes and high blood  pressure.  Dental: Annual dental screenings help detect early signs of oral cancer, gum disease, and other conditions linked to overall health, including heart disease and diabetes.  Please see the attached documents for additional preventive care recommendations.   NEXT AWV 12/17/25 @ 8:10 AM IN PERSON

## 2024-12-11 NOTE — Progress Notes (Signed)
 "  Chief Complaint  Patient presents with   Medicare Wellness     Subjective:   Carrie Wheeler is a 84 y.o. female who presents for a Medicare Annual Wellness Visit.  Visit info / Clinical Intake: Medicare Wellness Visit Type:: Subsequent Annual Wellness Visit Persons participating in visit and providing information:: patient Medicare Wellness Visit Mode:: In-person (required for WTM) Interpreter Needed?: No Pre-visit prep was completed: yes AWV questionnaire completed by patient prior to visit?: no Living arrangements:: (!) lives alone Patient's Overall Health Status Rating: very good Typical amount of pain: none Does pain affect daily life?: no Are you currently prescribed opioids?: no  Dietary Habits and Nutritional Risks How many meals a day?: 3 Eats fruit and vegetables daily?: yes Most meals are obtained by: preparing own meals; eating out (50/50) In the last 2 weeks, have you had any of the following?: none Diabetic:: no  Functional Status Activities of Daily Living (to include ambulation/medication): Independent Ambulation: Independent Medication Administration: Independent Home Management (perform basic housework or laundry): Independent Manage your own finances?: yes Primary transportation is: driving Concerns about vision?: no *vision screening is required for WTM* (readers- Dr.Dingeldein) Concerns about hearing?: no  Fall Screening Falls in the past year?: 1 Number of falls in past year: 1 Was there an injury with Fall?: 1 (hurt shoulder- had xrays nothing broken) Fall Risk Category Calculator: 3 Patient Fall Risk Level: High Fall Risk  Fall Risk Patient at Risk for Falls Due to: History of fall(s) Fall risk Follow up: Falls evaluation completed; Falls prevention discussed  Home and Transportation Safety: All rugs have non-skid backing?: yes All stairs or steps have railings?: yes Grab bars in the bathtub or shower?: yes Have non-skid surface in  bathtub or shower?: yes Good home lighting?: yes Regular seat belt use?: yes Hospital stays in the last year:: no  Cognitive Assessment Difficulty concentrating, remembering, or making decisions? : no Will 6CIT or Mini Cog be Completed: yes What year is it?: 0 points What month is it?: 0 points Give patient an address phrase to remember (5 components): [address] [place], [place] About what time is it?: 0 points Count backwards from 20 to 1: 0 points Say the months of the year in reverse: 0 points Repeat the address phrase from earlier: 4 points 6 CIT Score: 4 points  Advance Directives (For Healthcare) Does Patient Have a Medical Advance Directive?: Yes Does patient want to make changes to medical advance directive?: No - Patient declined Type of Advance Directive: Healthcare Power of [place]; Living will Copy of Healthcare Power of Attorney in Chart?: Yes - validated most recent copy scanned in chart (See row information) Copy of Living Will in Chart?: Yes - validated most recent copy scanned in chart (See row information)  Reviewed/Updated  Reviewed/Updated: Reviewed All (Medical, Surgical, Family, Medications, Allergies, Care Teams, Patient Goals)    Allergies (verified) Patient has no known allergies.   Current Medications (verified) Outpatient Encounter Medications as of 12/11/2024  Medication Sig   atorvastatin  (LIPITOR) 40 MG tablet TAKE 1 TABLET BY MOUTH EVERY OTHER DAY   Biotin 89999 MCG TABS Take by mouth.   calcium  carbonate (OS-CAL) 600 MG TABS tablet Take 1 tablet by mouth daily.   cetirizine (ZYRTEC) 10 MG tablet Take 1 tablet by mouth daily. As needed   Cholecalciferol (VITAMIN D ) 2000 UNITS CAPS Take by mouth daily.    fluticasone  (FLONASE ) 50 MCG/ACT nasal spray SHAKE LIQUID AND USE 2 SPRAYS IN [facility]  NOSTRIL EVERY DAY   hydrochlorothiazide  (HYDRODIURIL ) 25 MG tablet Take 1 tablet (25 mg total) by mouth daily. TAKE 1 TABLET(25 MG) BY MOUTH DAILY    Lactobacillus (PROBIOTIC ACIDOPHILUS PO) Take 1 tablet by mouth daily.   Omega-3 Fatty Acids (FISH OIL) 1000 MG CPDR Take by mouth in the morning and at bedtime.   pantoprazole  (PROTONIX ) 20 MG tablet Take 1 tablet (20 mg total) by mouth every other day.   psyllium (REGULOID) 0.52 g capsule Take 0.52 g by mouth daily as needed.   Turmeric (QC TUMERIC COMPLEX) 500 MG CAPS Take by mouth.   conjugated estrogens  (PREMARIN ) vaginal cream Place 1 Applicatorful vaginally 2 (two) times a week. Place 0.5g nightly for two weeks then twice a week after (Patient not taking: Reported on 12/11/2024)   No facility-administered encounter medications on file as of 12/11/2024.    History: Past Medical History:  Diagnosis Date   Environmental allergies    GERD (gastroesophageal reflux disease)    occasional   Hyperlipidemia    Hypertension    Past Surgical History:  Procedure Laterality Date   COLONOSCOPY     COLONOSCOPY WITH PROPOFOL  N/A 11/27/2015   Procedure: COLONOSCOPY WITH PROPOFOL ;  Surgeon: Rogelia Copping, MD;  Location: [facility];  Service: Endoscopy;  Laterality: N/A;   COLONOSCOPY WITH PROPOFOL  N/A 05/18/2021   Procedure: COLONOSCOPY WITH PROPOFOL ;  Surgeon: Copping Rogelia, MD;  Location: [facility];  Service: Endoscopy;  Laterality: N/A;   COLONOSCOPY WITH PROPOFOL  N/A 08/16/2022   Procedure: COLONOSCOPY/POLYPECTOMY  WITH PROPOFOL ;  Surgeon: Copping Rogelia, MD;  Location: [facility];  Service: Endoscopy;  Laterality: N/A;   ESOPHAGOGASTRODUODENOSCOPY (EGD) WITH PROPOFOL  N/A 05/18/2021   Procedure: ESOPHAGOGASTRODUODENOSCOPY (EGD) WITH PROPOFOL ;  Surgeon: Copping Rogelia, MD;  Location: [facility];  Service: Endoscopy;  Laterality: N/A;   POLYPECTOMY N/A 05/18/2021   Procedure: POLYPECTOMY;  Surgeon: Copping Rogelia, MD;  Location: [facility];  Service: Endoscopy;  Laterality: N/A;   TONSILLECTOMY     Family History  Problem Relation Age of Onset   Diabetes Mother     Hypertension Father    Colon cancer Father    Heart Problems Brother        Congential heart defect   Healthy Brother    Liver disease Son    Alzheimer's disease Other    Diabetes Other    Colon cancer Other    Breast cancer Neg Hx    Social History   Occupational History    Employer: [employer]    Comment: part-time  Tobacco Use   Smoking status: Never   Smokeless tobacco: Never  Vaping Use   Vaping status: Never Used  Substance and Sexual Activity   Alcohol use: Never    Comment: 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   Drug use: Never   Sexual activity: Not Currently   Tobacco Counseling Counseling given: Not Answered  SDOH Screenings   Food Insecurity: No Food Insecurity (10/04/2023)  Housing: Low Risk (10/04/2023)  Transportation Needs: No Transportation Needs (10/04/2023)  Utilities: Not At Risk (10/04/2023)  Alcohol Screen: Low Risk (10/04/2023)  Depression (PHQ2-9): Low Risk (10/04/2023)  Financial Resource Strain: Low Risk (10/04/2023)  Physical Activity: Insufficiently Active (10/04/2023)  Social Connections: Moderately Integrated (10/04/2023)  Stress: No Stress Concern Present (10/04/2023)  Tobacco Use: Low Risk (12/11/2024)  Health Literacy: Adequate Health Literacy (10/04/2023)   See flowsheets for full screening details  Depression Screen  Goals Addressed             This Visit's Progress    DIET - INCREASE WATER  INTAKE               Objective:    Today's Vitals   12/11/24 0809  BP: 122/74  Weight: 194 lb 14.4 oz (88.4 kg)  Height: 5' 1 (1.549 m)   Body mass index is 36.83 kg/m.  Hearing/Vision screen No results found. Immunizations and Health Maintenance Health Maintenance  Topic Date Due   Zoster Vaccines- Shingrix (1 of 2) Never done   Mammogram  10/06/2023   Influenza Vaccine  07/06/2024   COVID-19 Vaccine (4 - 2025-26 season) 08/06/2024   Medicare Annual Wellness (AWV)  12/11/2025   DTaP/Tdap/Td  (4 - Td or Tdap) 08/30/2026   Pneumococcal Vaccine: 50+ Years  Completed   Bone Density Scan  Completed   Meningococcal B Vaccine  Aged Out   Colonoscopy  Discontinued        Assessment/Plan:  This is a routine wellness examination for Jlyn.  Patient Care Team: Myrla Jon HERO, MD as PCP - General (Family Medicine) Theotis Inocente RAMAN, DO as Referring Physician (Chiropractic Medicine) Dingeldein, Elspeth, MD (Ophthalmology)  I have personally reviewed and noted the following in the patients chart:   Medical and social history Use of alcohol, tobacco or illicit drugs  Current medications and supplements including opioid prescriptions. Functional ability and status Nutritional status Physical activity Advanced directives List of other physicians Hospitalizations, surgeries, and ER visits in previous 12 months Vitals Screenings to include cognitive, depression, and falls Referrals and appointments  No orders of the defined types were placed in this encounter.  In addition, I have reviewed and discussed with patient certain preventive protocols, quality metrics, and best practice recommendations. A written personalized care plan for preventive services as well as general preventive health recommendations were provided to patient.   Jhonnie RAMAN Das, LPN   07/08/7972   Return in 1 year (on 12/11/2025).  After Visit Summary: (In Person-Printed) AVS printed and given to the patient  Nurse Notes: NEEDS SHINGRIX (NOT SURE IF SHE HAD CHICKENPOX AS A KID); DECLINES BDS REFERRAL   "

## 2024-12-13 ENCOUNTER — Ambulatory Visit (INDEPENDENT_AMBULATORY_CARE_PROVIDER_SITE_OTHER): Admitting: Family Medicine

## 2024-12-13 ENCOUNTER — Encounter: Payer: Self-pay | Admitting: Family Medicine

## 2024-12-13 VITALS — BP 129/63 | HR 73 | Temp 98.3°F | Resp 16 | Ht 60.0 in | Wt 196.0 lb

## 2024-12-13 DIAGNOSIS — J069 Acute upper respiratory infection, unspecified: Secondary | ICD-10-CM

## 2024-12-13 MED ORDER — PROMETHAZINE-DM 6.25-15 MG/5ML PO SYRP: 5.0000 mL | ORAL_SOLUTION | Freq: Four times a day (QID) | ORAL | 0 refills | Status: AC | PRN

## 2024-12-13 MED ORDER — ALBUTEROL SULFATE HFA 108 (90 BASE) MCG/ACT IN AERS: 2.0000 | INHALATION_SPRAY | Freq: Four times a day (QID) | RESPIRATORY_TRACT | 0 refills | Status: AC | PRN

## 2024-12-13 NOTE — Progress Notes (Signed)
 "     Established patient visit   Patient: Carrie Wheeler   DOB: 1941/02/02   84 y.o. Female  MRN: [id number] Visit Date: 12/13/2024  Today's healthcare provider: LAURAINE LOISE BUOY, DO   Chief Complaint  Patient presents with   Acute Visit    Sinus and chest congestion ( with a productive cough)    Sinusitis   Subjective    Sinusitis Associated symptoms include congestion, coughing (productive), sinus pressure and a sore throat. Pertinent negatives include no chills, ear pain or shortness of breath.    Carrie Wheeler June is an 84 year old female who presents with prolonged upper respiratory symptoms.  Her symptoms began on December 30th with sniffles and a runny nose. Initially, she experienced a sore throat, sinus pain, and pressure, which have since improved. Ear pain was also present but has resolved. Currently, she has a productive cough and postnasal drip.  She denies nausea, vomiting, diarrhea, chest pain, or shortness of breath. She feels fatigued and experiences a runny nose. She is unsure if she had a fever due to a broken thermometer but suspects it might have been low grade. She reports dizziness and lightheadedness, describing her head as not feeling normal. She also mentions hearing her heartbeat in her ear and wonders if it was related to the over-the-counter medications she was taking or something else.  She has been taking Mucinex and started Zyrtec, although she has not taken it consistently. She denies being around anyone else who has been sick.  Her cough is not severe enough to keep her awake at night, although she does wake up and cough for a while before going back to sleep. She has used an inhaler in the past but does not currently have one at home.      Medications: Show/hide medication list[1]  Review of Systems  Constitutional:  Positive for fatigue. Negative for chills and fever.  HENT:  Positive for congestion, postnasal drip, rhinorrhea, sinus  pressure and sore throat. Negative for ear pain and sinus pain.   Respiratory:  Positive for cough (productive). Negative for shortness of breath.   Cardiovascular:  Negative for chest pain and palpitations.  Gastrointestinal:  Negative for abdominal pain, diarrhea, nausea and vomiting.  Neurological:  Negative for dizziness and light-headedness.        Objective    BP 129/63 (BP Location: Left Arm, Patient Position: Bed low/side rails up, Cuff Size: Normal)   Pulse 73   Temp 98.3 F (36.8 C) (Oral)   Resp 16   Ht 5' (1.524 m)   Wt 196 lb (88.9 kg)   SpO2 98%   BMI 38.28 kg/m     Physical Exam Vitals reviewed.  Constitutional:      General: She is not in acute distress.    Appearance: Normal appearance. She is well-developed. She is not diaphoretic.  HENT:     Head: Normocephalic and atraumatic.     Right Ear: Tympanic membrane, ear canal and external ear normal.     Left Ear: Tympanic membrane, ear canal and external ear normal.     Nose: Rhinorrhea present.     Mouth/Throat:     Mouth: Mucous membranes are moist.     Pharynx: Oropharynx is clear. Posterior oropharyngeal erythema (mild) present. No oropharyngeal exudate.  Eyes:     General: No scleral icterus.    Conjunctiva/sclera: Conjunctivae normal.     Pupils: Pupils are equal, round, and reactive to light.  Cardiovascular:  Rate and Rhythm: Normal rate and regular rhythm.     Pulses: Normal pulses.     Heart sounds: Normal heart sounds. No murmur heard. Pulmonary:     Effort: Pulmonary effort is normal. No respiratory distress.     Breath sounds: Normal breath sounds. No wheezing or rales.  Musculoskeletal:     Cervical back: Neck supple.     Right lower leg: No edema.     Left lower leg: No edema.  Lymphadenopathy:     Cervical: No cervical adenopathy.  Skin:    General: Skin is warm and dry.     Findings: No rash.  Neurological:     Mental Status: She is alert.      No results found for any  visits on 12/13/24.  Assessment & Plan    Acute upper respiratory infection -     Albuterol  Sulfate HFA; Inhale 2 puffs into the lungs every 6 (six) hours as needed for wheezing or shortness of breath.  Dispense: 8 g; Refill: 0 -     Promethazine -DM; Take 5 mLs by mouth 4 (four) times daily as needed for cough.  Dispense: 118 mL; Refill: 0  Likely viral etiology. Improvement suggests self-limiting course. - Continue Zyrtec daily for 3 days or until follow-up (5 days). - Prescribed promethazine  DM for cough suppression. - Prescribed albuterol  inhaler every 4-6 hours as needed. - Advised hydration and rest with occasional activity. - Instructed to follow up if symptoms persist or worsen for potential prescription of antibiotics.    Return if symptoms worsen or fail to improve.      I discussed the assessment and treatment plan with the patient  The patient was provided an opportunity to ask questions and all were answered. The patient agreed with the plan and demonstrated an understanding of the instructions.   The patient was advised to call back or seek an in-person evaluation if the symptoms worsen or if the condition fails to improve as anticipated.    LAURAINE LOISE BUOY, DO  [facility] [phone] (phone) [phone] (fax)  [facility]    [1]  Outpatient Medications Prior to Visit  Medication Sig   atorvastatin  (LIPITOR) 40 MG tablet TAKE 1 TABLET BY MOUTH EVERY OTHER DAY   Biotin 89999 MCG TABS Take by mouth.   calcium  carbonate (OS-CAL) 600 MG TABS tablet Take 1 tablet by mouth daily.   cetirizine (ZYRTEC) 10 MG tablet Take 1 tablet by mouth daily. As needed   Cholecalciferol (VITAMIN D ) 2000 UNITS CAPS Take by mouth daily.    fluticasone  (FLONASE ) 50 MCG/ACT nasal spray SHAKE LIQUID AND USE 2 SPRAYS IN EACH NOSTRIL EVERY DAY   hydrochlorothiazide  (HYDRODIURIL ) 25 MG tablet Take 1 tablet (25 mg total) by mouth daily. TAKE 1  TABLET(25 MG) BY MOUTH DAILY   Lactobacillus (PROBIOTIC ACIDOPHILUS PO) Take 1 tablet by mouth daily.   Omega-3 Fatty Acids (FISH OIL) 1000 MG CPDR Take by mouth in the morning and at bedtime.   pantoprazole  (PROTONIX ) 20 MG tablet Take 1 tablet (20 mg total) by mouth every other day.   psyllium (REGULOID) 0.52 g capsule Take 0.52 g by mouth daily as needed.   Turmeric (QC TUMERIC COMPLEX) 500 MG CAPS Take by mouth.   [DISCONTINUED] conjugated estrogens  (PREMARIN ) vaginal cream Place 1 Applicatorful vaginally 2 (two) times a week. Place 0.5g nightly for two weeks then twice a week after   No facility-administered medications prior to visit.   "

## 2024-12-18 ENCOUNTER — Encounter: Admitting: Family Medicine

## 2024-12-25 ENCOUNTER — Ambulatory Visit (INDEPENDENT_AMBULATORY_CARE_PROVIDER_SITE_OTHER): Admitting: Family Medicine

## 2024-12-25 ENCOUNTER — Encounter: Payer: Self-pay | Admitting: Family Medicine

## 2024-12-25 VITALS — BP 134/61 | HR 68 | Resp 16 | Ht 60.0 in | Wt 195.1 lb

## 2024-12-25 DIAGNOSIS — R7303 Prediabetes: Secondary | ICD-10-CM | POA: Diagnosis not present

## 2024-12-25 DIAGNOSIS — M791 Myalgia, unspecified site: Secondary | ICD-10-CM | POA: Diagnosis not present

## 2024-12-25 DIAGNOSIS — E7800 Pure hypercholesterolemia, unspecified: Secondary | ICD-10-CM | POA: Diagnosis not present

## 2024-12-25 DIAGNOSIS — T466X5A Adverse effect of antihyperlipidemic and antiarteriosclerotic drugs, initial encounter: Secondary | ICD-10-CM | POA: Diagnosis not present

## 2024-12-25 DIAGNOSIS — E66812 Obesity, class 2: Secondary | ICD-10-CM | POA: Diagnosis not present

## 2024-12-25 DIAGNOSIS — I499 Cardiac arrhythmia, unspecified: Secondary | ICD-10-CM | POA: Diagnosis not present

## 2024-12-25 DIAGNOSIS — G4733 Obstructive sleep apnea (adult) (pediatric): Secondary | ICD-10-CM | POA: Diagnosis not present

## 2024-12-25 DIAGNOSIS — Z0000 Encounter for general adult medical examination without abnormal findings: Secondary | ICD-10-CM

## 2024-12-25 DIAGNOSIS — E559 Vitamin D deficiency, unspecified: Secondary | ICD-10-CM

## 2024-12-25 DIAGNOSIS — K2100 Gastro-esophageal reflux disease with esophagitis, without bleeding: Secondary | ICD-10-CM

## 2024-12-25 DIAGNOSIS — Z0001 Encounter for general adult medical examination with abnormal findings: Secondary | ICD-10-CM | POA: Diagnosis not present

## 2024-12-25 DIAGNOSIS — N1831 Chronic kidney disease, stage 3a: Secondary | ICD-10-CM

## 2024-12-25 DIAGNOSIS — I10 Essential (primary) hypertension: Secondary | ICD-10-CM | POA: Diagnosis not present

## 2024-12-25 NOTE — Progress Notes (Addendum)
 "  Complete physical exam  Patient: Carrie Wheeler   DOB: 1941-07-28   84 y.o. Female  MRN: [id number]  Subjective:    Chief Complaint  Patient presents with   Annual Exam    Diet: eats lots of fruits/veggies, no specific regimen Exercise: yard work only during good weather Feeling: pretty good Sleeping: well No concerns    Carrie Wheeler is a 84 y.o. female who presents today for a complete physical exam. She reports consuming a smaller portions and focuses more on healthy snacks.  Exercises with yard work and tending to her home. She generally feels well. She reports sleeping well. Recent URI now resolved. She is no longer using CPAP machine due to causing sleep disturbances. Considering the shingles vaccine for health maintenance.    Most recent fall risk assessment:    12/11/2024    8:17 AM  Fall Risk   Falls in the past year? 1  Number falls in past yr: 1  Injury with Fall? 1  Comment hurt shoulder- had xrays nothing broken  Risk for fall due to : History of fall(s)  Follow up Falls evaluation completed;Falls prevention discussed     Most recent depression screenings:    12/25/2024    9:09 AM 12/11/2024    8:26 AM  PHQ 2/9 Scores  PHQ - 2 Score 0 0  PHQ- 9 Score 0 0        Patient Care Team: Myrla Jon HERO, MD as PCP - General (Family Medicine) Theotis Inocente RAMAN, DO as Referring Physician (Chiropractic Medicine) Dingeldein, Elspeth, MD (Ophthalmology)   Show/hide medication list[1]  Review of Systems  Respiratory:  Negative for cough and shortness of breath.   Cardiovascular:  Negative for chest pain and palpitations.  Gastrointestinal:  Negative for heartburn.       Objective:     BP 134/61   Pulse 68   Resp 16   Ht 5' (1.524 m)   Wt 195 lb 1.6 oz (88.5 kg)   SpO2 98%   BMI 38.10 kg/m  BP Readings from Last 3 Encounters:  12/25/24 134/61  12/13/24 129/63  12/11/24 122/74   Wt Readings from Last 3 Encounters:  12/25/24 195 lb 1.6 oz  (88.5 kg)  12/13/24 196 lb (88.9 kg)  12/11/24 194 lb 14.4 oz (88.4 kg)      Physical Exam Constitutional:      Appearance: Normal appearance.  HENT:     Right Ear: Tympanic membrane normal.     Left Ear: Tympanic membrane normal.  Eyes:     Conjunctiva/sclera: Conjunctivae normal.     Pupils: Pupils are equal, round, and reactive to light.  Cardiovascular:     Rate and Rhythm: Rhythm irregular.  Pulmonary:     Effort: Pulmonary effort is normal.     Breath sounds: Normal breath sounds.  Neurological:     Mental Status: She is alert.      Results for orders placed or performed in visit on 12/25/24  Hemoglobin A1c  Result Value Ref Range   Hgb A1c MFr Bld 5.7 (H) 4.8 - 5.6 %   Est. average glucose Bld gHb Est-mCnc 117 mg/dL  Comprehensive metabolic panel with GFR  Result Value Ref Range   Glucose 89 70 - 99 mg/dL   BUN 21 8 - 27 mg/dL   Creatinine, Ser 8.99 0.57 - 1.00 mg/dL   eGFR 57 (L) >40 fO/fpw/8.26   BUN/Creatinine Ratio 21 12 - 28   Sodium 142  134 - 144 mmol/L   Potassium 4.5 3.5 - 5.2 mmol/L   Chloride 102 96 - 106 mmol/L   CO2 25 20 - 29 mmol/L   Calcium  9.4 8.7 - 10.3 mg/dL   Total Protein 6.6 6.0 - 8.5 g/dL   Albumin 4.1 3.7 - 4.7 g/dL   Globulin, Total 2.5 1.5 - 4.5 g/dL   Bilirubin Total 0.8 0.0 - 1.2 mg/dL   Alkaline Phosphatase 113 48 - 129 IU/L   AST 17 0 - 40 IU/L   ALT 11 0 - 32 IU/L  Lipid panel  Result Value Ref Range   Cholesterol, Total 163 100 - 199 mg/dL   Triglycerides 69 0 - 149 mg/dL   HDL 56 >60 mg/dL   VLDL Cholesterol Cal 13 5 - 40 mg/dL   LDL Chol Calc (NIH) 94 0 - 99 mg/dL   Chol/HDL Ratio 2.9 0.0 - 4.4 ratio  VITAMIN D  25 Hydroxy (Vit-D Deficiency, Fractures)  Result Value Ref Range   Vit D, 25-Hydroxy 33.6 30.0 - 100.0 ng/mL   Last metabolic panel Lab Results  Component Value Date   GLUCOSE 89 12/25/2024   NA 142 12/25/2024   K 4.5 12/25/2024   CL 102 12/25/2024   CO2 25 12/25/2024   BUN 21 12/25/2024   CREATININE  1.00 12/25/2024   EGFR 57 (L) 12/25/2024   CALCIUM  9.4 12/25/2024   PROT 6.6 12/25/2024   ALBUMIN 4.1 12/25/2024   LABGLOB 2.5 12/25/2024   AGRATIO 1.5 05/12/2023   BILITOT 0.8 12/25/2024   ALKPHOS 113 12/25/2024   AST 17 12/25/2024   ALT 11 12/25/2024   ANIONGAP 6 11/17/2015   Last lipids Lab Results  Component Value Date   CHOL 163 12/25/2024   HDL 56 12/25/2024   LDLCALC 94 12/25/2024   TRIG 69 12/25/2024   CHOLHDL 2.9 12/25/2024   Last hemoglobin A1c Lab Results  Component Value Date   HGBA1C 5.7 (H) 12/25/2024   Last vitamin D  Lab Results  Component Value Date   VD25OH 33.6 12/25/2024        Assessment & Plan:    Routine Health Maintenance and Physical Exam  Immunization History  Administered Date(s) Administered   Fluad Quad(high Dose 65+) 10/20/2021, 11/09/2022   Fluad Trivalent(High Dose 65+) 08/25/2023, 09/24/2024   INFLUENZA, HIGH DOSE SEASONAL PF 09/12/2017, 09/18/2018, 10/03/2019, 09/16/2020   Influenza-Unspecified 09/19/2024   PFIZER(Purple Top)SARS-COV-2 Vaccination 01/30/2020, 02/20/2020, 10/14/2020   Pneumococcal Conjugate-13 05/19/2015   Pneumococcal Polysaccharide-23 04/02/2011   Td 06/01/2006, 08/30/2016   Tdap 06/01/2006    Health Maintenance  Topic Date Due   Zoster Vaccines- Shingrix (1 of 2) Never done   COVID-19 Vaccine (4 - 2025-26 season) 08/06/2024   Medicare Annual Wellness (AWV)  12/11/2025   DTaP/Tdap/Td (4 - Td or Tdap) 08/30/2026   Pneumococcal Vaccine: 50+ Years  Completed   Influenza Vaccine  Completed   Bone Density Scan  Completed   Meningococcal B Vaccine  Aged Out   Mammogram  Discontinued   Colonoscopy  Discontinued    Discussed health benefits of physical activity, and encouraged her to engage in regular exercise appropriate for her age and condition.  Problem List Items Addressed This Visit       Cardiovascular and Mediastinum   Essential (primary) hypertension   Well-controlled with hydrochlorothiazide   25mg  daily. BP today 134/61. -Continue current antihypertensive regimen -Recheck metabolic panel       Relevant Orders   Comprehensive metabolic panel with GFR (Completed)  Respiratory   OSA (obstructive sleep apnea)   Patient discontinued use of CPAP due to causing sleep disturbances. Discussed importance of use due to OSA causing irregular heart rhythm along with shared decision making to consider restarting use.         Digestive   GERD (gastroesophageal reflux disease)   Well-controlled with no recent flare-ups managed with pantoprazole  20mg  daily. Consider discontinuing due to long-term risks associated with prolonged use.         Genitourinary   Stage 3a chronic kidney disease (HCC)   Continued monitoring with medication use and previously noted age-related reduction in GFR. Risk factors such as HTN and HLD are wll-controlled.  - Continue monitoring kidney function - Use NSAIDs sparingly       Relevant Orders   Comprehensive metabolic panel with GFR (Completed)     Other   Hyperlipidemia   Current management of Lipitor 40mg  daily. Last LDL of 85. Continue current regimen and recheck lipid panel.      Relevant Orders   Comprehensive metabolic panel with GFR (Completed)   Lipid panel (Completed)   Morbid obesity (HCC)   BMI 38 and assoc with HTN, GERD, OSA, CKD Discussed diet and exercise and how improvement in these areas are important to healthy weight management.       Avitaminosis D   Continue supplement and recheck levels.       Relevant Orders   VITAMIN D  25 Hydroxy (Vit-D Deficiency, Fractures) (Completed)   Myalgia due to statin   Prediabetes   Previously elevated A1c but most recently at 5.6. Will recheck today to assess for further management is necessary.       Relevant Orders   Hemoglobin A1c (Completed)   Irregular heart rhythm   Persistent irregular heart rhythm. Discussed importance of CPAP use for controlling this issue.       Other  Visit Diagnoses       Encounter for annual physical exam    -  Primary   Relevant Orders   Hemoglobin A1c (Completed)   Comprehensive metabolic panel with GFR (Completed)   Lipid panel (Completed)   VITAMIN D  25 Hydroxy (Vit-D Deficiency, Fractures) (Completed)      Return in about 6 months (around 06/24/2025) for chronic disease f/u.      Patient seen along with MS3 student, Johnsie Barefoot. I personally evaluated this patient along with the student, and verified all aspects of the history, physical exam, and medical decision making as documented by the student. I agree with the student's documentation and have made all necessary edits.  Myrla Jon HERO, MD, MPH [facility]       [1]  Outpatient Medications Prior to Visit  Medication Sig   albuterol  (VENTOLIN  HFA) 108 (90 Base) MCG/ACT inhaler Inhale 2 puffs into the lungs every 6 (six) hours as needed for wheezing or shortness of breath.   atorvastatin  (LIPITOR) 40 MG tablet TAKE 1 TABLET BY MOUTH EVERY OTHER DAY   Biotin 89999 MCG TABS Take by mouth.   calcium  carbonate (OS-CAL) 600 MG TABS tablet Take 1 tablet by mouth daily.   cetirizine (ZYRTEC) 10 MG tablet Take 1 tablet by mouth daily. As needed   Cholecalciferol (VITAMIN D ) 2000 UNITS CAPS Take by mouth daily.    fluticasone  (FLONASE ) 50 MCG/ACT nasal spray SHAKE LIQUID AND USE 2 SPRAYS IN EACH NOSTRIL EVERY DAY   hydrochlorothiazide  (HYDRODIURIL ) 25 MG tablet Take 1 tablet (25 mg total) by  mouth daily. TAKE 1 TABLET(25 MG) BY MOUTH DAILY   Lactobacillus (PROBIOTIC ACIDOPHILUS PO) Take 1 tablet by mouth daily.   Omega-3 Fatty Acids (FISH OIL) 1000 MG CPDR Take by mouth in the morning and at bedtime.   pantoprazole  (PROTONIX ) 20 MG tablet Take 1 tablet (20 mg total) by mouth every other day.   promethazine -dextromethorphan (PROMETHAZINE -DM) 6.25-15 MG/5ML syrup Take 5 mLs by mouth 4 (four) times daily as needed for cough.    psyllium (REGULOID) 0.52 g capsule Take 0.52 g by mouth daily as needed.   Turmeric (QC TUMERIC COMPLEX) 500 MG CAPS Take by mouth.   No facility-administered medications prior to visit.   "

## 2024-12-25 NOTE — Assessment & Plan Note (Signed)
 Current management of Lipitor 40mg  daily. Last LDL of 85. Continue current regimen and recheck lipid panel.

## 2024-12-25 NOTE — Assessment & Plan Note (Signed)
 Previously elevated A1c but most recently at 5.6. Will recheck today to assess for further management is necessary.

## 2024-12-25 NOTE — Assessment & Plan Note (Signed)
 Persistent irregular heart rhythm. Discussed importance of CPAP use for controlling this issue.

## 2024-12-25 NOTE — Assessment & Plan Note (Addendum)
 Well-controlled with no recent flare-ups managed with pantoprazole  20mg  daily. Consider discontinuing due to long-term risks associated with prolonged use.

## 2024-12-25 NOTE — Assessment & Plan Note (Signed)
 Patient discontinued use of CPAP due to causing sleep disturbances. Discussed importance of use due to OSA causing irregular heart rhythm along with shared decision making to consider restarting use.

## 2024-12-25 NOTE — Patient Instructions (Addendum)
 Carrie Wheeler

## 2024-12-25 NOTE — Assessment & Plan Note (Signed)
 Continued monitoring with medication use and previously noted age-related reduction in GFR. Risk factors such as HTN and HLD are wll-controlled.  - Continue monitoring kidney function - Use NSAIDs sparingly

## 2024-12-25 NOTE — Assessment & Plan Note (Addendum)
 BMI 38 and assoc with HTN, GERD, OSA, CKD Discussed diet and exercise and how improvement in these areas are important to healthy weight management.

## 2024-12-25 NOTE — Assessment & Plan Note (Signed)
 Well-controlled with hydrochlorothiazide  25mg  daily. BP today 134/61. -Continue current antihypertensive regimen -Recheck metabolic panel

## 2024-12-25 NOTE — Assessment & Plan Note (Signed)
Continue supplement and recheck levels.

## 2024-12-26 LAB — HEMOGLOBIN A1C
Est. average glucose Bld gHb Est-mCnc: 117 mg/dL
Hgb A1c MFr Bld: 5.7 % — ABNORMAL HIGH (ref 4.8–5.6)

## 2024-12-26 LAB — COMPREHENSIVE METABOLIC PANEL WITH GFR
ALT: 11 IU/L (ref 0–32)
AST: 17 IU/L (ref 0–40)
Albumin: 4.1 g/dL (ref 3.7–4.7)
Alkaline Phosphatase: 113 IU/L (ref 48–129)
BUN/Creatinine Ratio: 21 (ref 12–28)
BUN: 21 mg/dL (ref 8–27)
Bilirubin Total: 0.8 mg/dL (ref 0.0–1.2)
CO2: 25 mmol/L (ref 20–29)
Calcium: 9.4 mg/dL (ref 8.7–10.3)
Chloride: 102 mmol/L (ref 96–106)
Creatinine, Ser: 1 mg/dL (ref 0.57–1.00)
Globulin, Total: 2.5 g/dL (ref 1.5–4.5)
Glucose: 89 mg/dL (ref 70–99)
Potassium: 4.5 mmol/L (ref 3.5–5.2)
Sodium: 142 mmol/L (ref 134–144)
Total Protein: 6.6 g/dL (ref 6.0–8.5)
eGFR: 57 mL/min/1.73 — ABNORMAL LOW

## 2024-12-26 LAB — LIPID PANEL
Chol/HDL Ratio: 2.9 ratio (ref 0.0–4.4)
Cholesterol, Total: 163 mg/dL (ref 100–199)
HDL: 56 mg/dL
LDL Chol Calc (NIH): 94 mg/dL (ref 0–99)
Triglycerides: 69 mg/dL (ref 0–149)
VLDL Cholesterol Cal: 13 mg/dL (ref 5–40)

## 2024-12-26 LAB — VITAMIN D 25 HYDROXY (VIT D DEFICIENCY, FRACTURES): Vit D, 25-Hydroxy: 33.6 ng/mL (ref 30.0–100.0)

## 2024-12-27 ENCOUNTER — Ambulatory Visit: Payer: Self-pay | Admitting: Family Medicine

## 2025-06-27 ENCOUNTER — Ambulatory Visit: Admitting: Family Medicine

## 2025-12-17 ENCOUNTER — Ambulatory Visit
